# Patient Record
Sex: Male | Born: 1965 | Race: Black or African American | Hispanic: No | Marital: Married | Smoking: Never smoker
Health system: Southern US, Community
[De-identification: ages and names within clinical notes are randomized; demographics above are authoritative.]

## PROBLEM LIST (undated history)

## (undated) DIAGNOSIS — I10 Essential (primary) hypertension: Secondary | ICD-10-CM

## (undated) DIAGNOSIS — Z8739 Personal history of other diseases of the musculoskeletal system and connective tissue: Secondary | ICD-10-CM

## (undated) DIAGNOSIS — G4730 Sleep apnea, unspecified: Secondary | ICD-10-CM

## (undated) DIAGNOSIS — I639 Cerebral infarction, unspecified: Secondary | ICD-10-CM

## (undated) DIAGNOSIS — I219 Acute myocardial infarction, unspecified: Secondary | ICD-10-CM

## (undated) HISTORY — PX: HAND SURGERY: SHX662

## (undated) HISTORY — PX: CYST REMOVAL LEG: SHX6280

## (undated) HISTORY — PX: SKIN BIOPSY: SHX1

## (undated) HISTORY — PX: CARDIAC CATHETERIZATION: SHX172

## (undated) HISTORY — PX: ANKLE FRACTURE SURGERY: SHX122

---

## 2002-08-10 DIAGNOSIS — I219 Acute myocardial infarction, unspecified: Secondary | ICD-10-CM

## 2002-08-10 HISTORY — DX: Acute myocardial infarction, unspecified: I21.9

## 2011-08-11 HISTORY — PX: ROUX-EN-Y GASTRIC BYPASS: SHX1104

## 2013-05-28 ENCOUNTER — Emergency Department (HOSPITAL_COMMUNITY)
Admission: EM | Admit: 2013-05-28 | Discharge: 2013-05-28 | Disposition: A | Discharge status: Home / Self Care | Payer: 59 | Attending: Emergency Medicine | Admitting: Emergency Medicine

## 2013-05-28 ENCOUNTER — Encounter (HOSPITAL_COMMUNITY): Payer: Self-pay | Admitting: Emergency Medicine

## 2013-05-28 ENCOUNTER — Emergency Department (HOSPITAL_COMMUNITY): Payer: 59

## 2013-05-28 DIAGNOSIS — Z9861 Coronary angioplasty status: Secondary | ICD-10-CM | POA: Insufficient documentation

## 2013-05-28 DIAGNOSIS — I319 Disease of pericardium, unspecified: Secondary | ICD-10-CM | POA: Insufficient documentation

## 2013-05-28 DIAGNOSIS — Z79899 Other long term (current) drug therapy: Secondary | ICD-10-CM | POA: Insufficient documentation

## 2013-05-28 DIAGNOSIS — I10 Essential (primary) hypertension: Secondary | ICD-10-CM | POA: Insufficient documentation

## 2013-05-28 DIAGNOSIS — R079 Chest pain, unspecified: Secondary | ICD-10-CM | POA: Insufficient documentation

## 2013-05-28 HISTORY — DX: Essential (primary) hypertension: I10

## 2013-05-28 LAB — CBC
HCT: 40.8 % (ref 39.0–52.0)
MCH: 29 pg (ref 26.0–34.0)
MCHC: 34.6 g/dL (ref 30.0–36.0)
MCV: 83.8 fL (ref 78.0–100.0)
RDW: 13.5 % (ref 11.5–15.5)

## 2013-05-28 LAB — BASIC METABOLIC PANEL
BUN: 16 mg/dL (ref 6–23)
Calcium: 9.2 mg/dL (ref 8.4–10.5)
Creatinine, Ser: 1.25 mg/dL (ref 0.50–1.35)
GFR calc Af Amer: 78 mL/min — ABNORMAL LOW (ref >90)
GFR calc non Af Amer: 68 mL/min — ABNORMAL LOW (ref >90)

## 2013-05-28 LAB — POCT I-STAT TROPONIN I: Troponin i, poc: 0 ng/mL (ref 0.00–0.08)

## 2013-05-28 LAB — PRO B NATRIURETIC PEPTIDE: Pro B Natriuretic peptide (BNP): 186.5 pg/mL — ABNORMAL HIGH (ref 0–125)

## 2013-05-28 MED ORDER — ASPIRIN 81 MG PO CHEW
324.0000 mg | CHEWABLE_TABLET | Freq: Once | ORAL | Status: AC
  Administered 2013-05-28: 324 mg via ORAL
  Filled 2013-05-28: qty 4

## 2013-05-28 MED ORDER — IBUPROFEN 600 MG PO TABS: 600.0000 mg | ORAL_TABLET | Freq: Three times a day (TID) | ORAL | Status: DC | PRN

## 2013-05-28 MED ORDER — KETOROLAC TROMETHAMINE 30 MG/ML IJ SOLN
30.0000 mg | Freq: Once | INTRAMUSCULAR | Status: AC
  Administered 2013-05-28: 30 mg via INTRAVENOUS
  Filled 2013-05-28: qty 1

## 2013-05-28 MED ORDER — ASPIRIN 325 MG PO TABS: 325.0000 mg | ORAL_TABLET | ORAL | Status: DC

## 2013-05-28 MED ORDER — NITROGLYCERIN 0.4 MG SL SUBL
0.4000 mg | SUBLINGUAL_TABLET | SUBLINGUAL | Status: DC | PRN
  Administered 2013-05-28: 0.4 mg via SUBLINGUAL
  Filled 2013-05-28: qty 25

## 2013-05-28 MED ORDER — OXYCODONE-ACETAMINOPHEN 5-325 MG PO TABS: 1.0000 | ORAL_TABLET | ORAL | Status: DC | PRN

## 2013-05-28 MED ORDER — ONDANSETRON HCL 4 MG/2ML IJ SOLN
4.0000 mg | Freq: Once | INTRAMUSCULAR | Status: AC
Start: 2013-05-28 — End: 2013-05-28
  Administered 2013-05-28: 4 mg via INTRAVENOUS
  Filled 2013-05-28: qty 2

## 2013-05-28 MED ORDER — MORPHINE SULFATE 4 MG/ML IJ SOLN
6.0000 mg | Freq: Once | INTRAMUSCULAR | Status: AC
  Administered 2013-05-28: 6 mg via INTRAVENOUS
  Filled 2013-05-28: qty 2

## 2013-05-28 NOTE — ED Provider Notes (Signed)
CSN: [id number]     Arrival date & time 05/28/13  2022 History   First MD Initiated Contact with Patient 05/28/13 2027     Chief Complaint  Patient presents with  . Chest Pain    HPI Patient reports with constant chest pain in the anterior chest which began this morning and has been constant through the day although it has worsened this evening.  He does report that this pain feels sharp and does slightly make him feel short of breath.  No history of DVT or pulmonary embolism.  He reports he had a heart catheterization in 2012 that demonstrated nonobstructive coronary disease.  He states pain is worse when he lays flat it is improved when he sits up.  No cough. no congestion.  Symptoms are mild to moderate in severity   Past Medical History  Diagnosis Date  . Hypertension    History reviewed. No pertinent past surgical history. History reviewed. No pertinent family history. History  Substance Use Topics  . Smoking status: Never Smoker   . Smokeless tobacco: Not on file  . Alcohol Use: No    Review of Systems  All other systems reviewed and are negative.    Allergies  Review of patient's allergies indicates no known allergies.  Home Medications   Current Outpatient Rx  Name  Route  Sig  Dispense  Refill  . colchicine 0.6 MG tablet   Oral   Take 0.6 mg by mouth daily.         Marland Kitchen ibuprofen (ADVIL,MOTRIN) 600 MG tablet   Oral   Take 1 tablet (600 mg total) by mouth every 8 (eight) hours as needed for pain.   15 tablet   0   . oxyCODONE-acetaminophen (PERCOCET/ROXICET) 5-325 MG per tablet   Oral   Take 1 tablet by mouth every 4 (four) hours as needed for pain.   20 tablet   0    BP 147/90  Pulse 78  Temp(Src) 98.6 F (37 C) (Oral)  Resp 12  SpO2 96% Physical Exam  Nursing note and vitals reviewed. Constitutional: He is oriented to person, place, and time. He appears well-developed and well-nourished.  HENT:  Head: Normocephalic and atraumatic.  Eyes: EOM  are normal.  Neck: Normal range of motion.  Cardiovascular: Normal rate, regular rhythm, normal heart sounds and intact distal pulses.   Pulmonary/Chest: Effort normal and breath sounds normal. No respiratory distress.  Abdominal: Soft. He exhibits no distension. There is no tenderness.  Musculoskeletal: Normal range of motion.  Neurological: He is alert and oriented to person, place, and time.  Skin: Skin is warm and dry.  Psychiatric: He has a normal mood and affect. Judgment normal.    ED Course  Procedures (including critical care time) Labs Review Labs Reviewed  BASIC METABOLIC PANEL - Abnormal; Notable for the following:    Potassium 3.2 (*)    Glucose, Bld 104 (*)    GFR calc non Af Amer 68 (*)    GFR calc Af Amer 78 (*)    All other components within normal limits  PRO B NATRIURETIC PEPTIDE - Abnormal; Notable for the following:    Pro B Natriuretic peptide (BNP) 186.5 (*)    All other components within normal limits  CBC  POCT I-STAT TROPONIN I   Imaging Review Dg Chest Port 1 View  05/28/2013   CLINICAL DATA:  Chest pain  EXAM: PORTABLE CHEST - 1 VIEW  COMPARISON:  None.  FINDINGS: The heart size  and mediastinal contours are within normal limits. Both lungs are clear. The visualized skeletal structures are unremarkable.  IMPRESSION: No active disease.   Electronically Signed   By: Marlan Palau M.D.   On: 05/28/2013 20:58  I personally reviewed the imaging tests through PACS system I reviewed available ER/hospitalization records through the EMR   EKG Interpretation     Ventricular Rate:  91 PR Interval:  187 QRS Duration: 104 QT Interval:  377 QTC Calculation: 464 R Axis:   79 Text Interpretation:  Sinus rhythm No previous ECGs available            MDM   1. Chest pain   2. Pericarditis    As his symptoms seem more consistent with pericarditis.  This does not appear to be a presentation of ACS.  Her catheterization in 2012 demonstrating  nonobstructive disease per the patient.  He does not have stents in his heart.  Discharge home in good condition.    Lyanne Co, MD 05/28/13 [phone]

## 2013-05-28 NOTE — ED Notes (Signed)
Pt with onset of chest pain this morning, intermitten today, worse tonight, has had nausea, vomiting, sweating, weakness, dizziness. History of MI 2012

## 2014-05-27 DIAGNOSIS — I639 Cerebral infarction, unspecified: Secondary | ICD-10-CM

## 2014-05-27 HISTORY — DX: Cerebral infarction, unspecified: I63.9

## 2014-05-31 ENCOUNTER — Emergency Department (HOSPITAL_COMMUNITY): Payer: 59

## 2014-05-31 ENCOUNTER — Inpatient Hospital Stay (HOSPITAL_COMMUNITY): Payer: 59

## 2014-05-31 ENCOUNTER — Encounter (HOSPITAL_COMMUNITY): Payer: Self-pay | Admitting: Emergency Medicine

## 2014-05-31 ENCOUNTER — Inpatient Hospital Stay (HOSPITAL_COMMUNITY)
Admission: EM | Admit: 2014-05-31 | Discharge: 2014-06-01 | DRG: 092 | Disposition: A | Discharge status: Home / Self Care | Payer: 59 | Attending: Internal Medicine | Admitting: Internal Medicine

## 2014-05-31 DIAGNOSIS — E663 Overweight: Secondary | ICD-10-CM | POA: Diagnosis present

## 2014-05-31 DIAGNOSIS — R51 Headache: Secondary | ICD-10-CM | POA: Diagnosis present

## 2014-05-31 DIAGNOSIS — M940 Chondrocostal junction syndrome [Tietze]: Secondary | ICD-10-CM | POA: Diagnosis present

## 2014-05-31 DIAGNOSIS — G8190 Hemiplegia, unspecified affecting unspecified side: Secondary | ICD-10-CM

## 2014-05-31 DIAGNOSIS — I10 Essential (primary) hypertension: Secondary | ICD-10-CM | POA: Diagnosis present

## 2014-05-31 DIAGNOSIS — I252 Old myocardial infarction: Secondary | ICD-10-CM

## 2014-05-31 DIAGNOSIS — R531 Weakness: Secondary | ICD-10-CM | POA: Diagnosis present

## 2014-05-31 DIAGNOSIS — G8191 Hemiplegia, unspecified affecting right dominant side: Secondary | ICD-10-CM | POA: Insufficient documentation

## 2014-05-31 DIAGNOSIS — R200 Anesthesia of skin: Principal | ICD-10-CM | POA: Diagnosis present

## 2014-05-31 DIAGNOSIS — E162 Hypoglycemia, unspecified: Secondary | ICD-10-CM | POA: Diagnosis present

## 2014-05-31 DIAGNOSIS — I639 Cerebral infarction, unspecified: Secondary | ICD-10-CM

## 2014-05-31 DIAGNOSIS — I2510 Atherosclerotic heart disease of native coronary artery without angina pectoris: Secondary | ICD-10-CM | POA: Diagnosis present

## 2014-05-31 DIAGNOSIS — R0789 Other chest pain: Secondary | ICD-10-CM | POA: Diagnosis present

## 2014-05-31 DIAGNOSIS — R079 Chest pain, unspecified: Secondary | ICD-10-CM | POA: Diagnosis present

## 2014-05-31 DIAGNOSIS — R071 Chest pain on breathing: Secondary | ICD-10-CM

## 2014-05-31 DIAGNOSIS — G5621 Lesion of ulnar nerve, right upper limb: Secondary | ICD-10-CM

## 2014-05-31 DIAGNOSIS — Z6827 Body mass index (BMI) 27.0-27.9, adult: Secondary | ICD-10-CM | POA: Diagnosis not present

## 2014-05-31 DIAGNOSIS — Z9884 Bariatric surgery status: Secondary | ICD-10-CM

## 2014-05-31 DIAGNOSIS — G5620 Lesion of ulnar nerve, unspecified upper limb: Secondary | ICD-10-CM

## 2014-05-31 DIAGNOSIS — G458 Other transient cerebral ischemic attacks and related syndromes: Secondary | ICD-10-CM

## 2014-05-31 HISTORY — DX: Cerebral infarction, unspecified: I63.9

## 2014-05-31 HISTORY — DX: Personal history of other diseases of the musculoskeletal system and connective tissue: Z87.39

## 2014-05-31 HISTORY — DX: Sleep apnea, unspecified: G47.30

## 2014-05-31 HISTORY — DX: Acute myocardial infarction, unspecified: I21.9

## 2014-05-31 LAB — CBC
HCT: 41.2 % (ref 39.0–52.0)
Hemoglobin: 14 g/dL (ref 13.0–17.0)
MCH: 28.9 pg (ref 26.0–34.0)
MCHC: 34 g/dL (ref 30.0–36.0)
MCV: 84.9 fL (ref 78.0–100.0)
PLATELETS: 280 10*3/uL (ref 150–400)
RBC: 4.85 MIL/uL (ref 4.22–5.81)
RDW: 13.1 % (ref 11.5–15.5)
WBC: 10.3 10*3/uL (ref 4.0–10.5)

## 2014-05-31 LAB — TROPONIN I: Troponin I: <0.3 ng/mL (ref <0.30)

## 2014-05-31 LAB — GLUCOSE, CAPILLARY
Glucose-Capillary: 120 mg/dL — ABNORMAL HIGH (ref 70–99)
Glucose-Capillary: 78 mg/dL (ref 70–99)

## 2014-05-31 LAB — BASIC METABOLIC PANEL
Anion gap: 13 (ref 5–15)
BUN: 13 mg/dL (ref 6–23)
CHLORIDE: 104 meq/L (ref 96–112)
CO2: 28 mEq/L (ref 19–32)
Calcium: 9.1 mg/dL (ref 8.4–10.5)
Creatinine, Ser: 1.34 mg/dL (ref 0.50–1.35)
GFR calc Af Amer: 71 mL/min — ABNORMAL LOW (ref >90)
GFR, EST NON AFRICAN AMERICAN: 62 mL/min — AB (ref >90)
GLUCOSE: 39 mg/dL — AB (ref 70–99)
Potassium: 3.8 mEq/L (ref 3.7–5.3)
Sodium: 145 mEq/L (ref 137–147)

## 2014-05-31 LAB — HEMOGLOBIN A1C
Hgb A1c MFr Bld: 6.1 % — ABNORMAL HIGH (ref <5.7)
Mean Plasma Glucose: 128 mg/dL — ABNORMAL HIGH (ref <117)

## 2014-05-31 LAB — PRO B NATRIURETIC PEPTIDE: PRO B NATRI PEPTIDE: 64.9 pg/mL (ref 0–125)

## 2014-05-31 LAB — I-STAT TROPONIN, ED: TROPONIN I, POC: 0.01 ng/mL (ref 0.00–0.08)

## 2014-05-31 LAB — CBG MONITORING, ED: Glucose-Capillary: 96 mg/dL (ref 70–99)

## 2014-05-31 LAB — D-DIMER, QUANTITATIVE (NOT AT ARMC)

## 2014-05-31 MED ORDER — ONDANSETRON HCL 4 MG/2ML IJ SOLN
4.0000 mg | Freq: Once | INTRAMUSCULAR | Status: AC
  Administered 2014-05-31: 4 mg via INTRAVENOUS
  Filled 2014-05-31: qty 2

## 2014-05-31 MED ORDER — INSULIN ASPART 100 UNIT/ML ~~LOC~~ SOLN: 0.0000 [IU] | Freq: Three times a day (TID) | SUBCUTANEOUS | Status: DC

## 2014-05-31 MED ORDER — ASPIRIN 325 MG PO TABS
325.0000 mg | ORAL_TABLET | ORAL | Status: AC
  Administered 2014-05-31: 325 mg via ORAL
  Filled 2014-05-31: qty 1

## 2014-05-31 MED ORDER — ASPIRIN 325 MG PO TABS
325.0000 mg | ORAL_TABLET | Freq: Every day | ORAL | Status: DC
  Administered 2014-05-31 – 2014-06-01 (×2): 325 mg via ORAL
  Filled 2014-05-31 (×2): qty 1

## 2014-05-31 MED ORDER — SODIUM CHLORIDE 0.9 % IV SOLN
INTRAVENOUS | Status: AC
  Administered 2014-05-31: 09:00:00 via INTRAVENOUS

## 2014-05-31 MED ORDER — MORPHINE SULFATE 2 MG/ML IJ SOLN
2.0000 mg | INTRAMUSCULAR | Status: DC | PRN
  Administered 2014-05-31: 2 mg via INTRAVENOUS
  Filled 2014-05-31: qty 1

## 2014-05-31 MED ORDER — ACETAMINOPHEN 325 MG PO TABS
650.0000 mg | ORAL_TABLET | ORAL | Status: DC | PRN
  Administered 2014-06-01: 650 mg via ORAL
  Filled 2014-05-31: qty 2

## 2014-05-31 MED ORDER — STROKE: EARLY STAGES OF RECOVERY BOOK
Freq: Once | Status: AC
  Administered 2014-06-01: 14:00:00
  Filled 2014-05-31 (×2): qty 1

## 2014-05-31 NOTE — ED Provider Notes (Signed)
CSN: [id number]     Arrival date & time 05/31/14  0020 History   First MD Initiated Contact with Patient 05/31/14 0256     Chief Complaint  Patient presents with  . Chest Pain  . Headache  . Extremity Weakness     (Consider location/radiation/quality/duration/timing/severity/associated sxs/prior Treatment) Patient is a 48 y.o. male presenting with chest pain, headaches, and extremity weakness. The history is provided by the patient.  Chest Pain Associated symptoms: headache   Headache Extremity Weakness Associated symptoms include chest pain and headaches.  He has been having sharp midsternal chest pain for the last 3 days. Her pain is worse with deep breath and worse with movement. He rates pain at 8/10. There is mild associated nausea and he has had some dyspnea and diaphoresis. He has also been noticing weakness in his right arm and leg which have been worsening over the last 3 days. Today, he could not turn the key in his car and could not lift up a cup. He has had a mild bifrontal headache. There's been no fever chills or sweats. He denies any cough. He did have a recent long-distance car trip to [place]. Symptoms started when he was in [place] but [facility] is a wait until he returns. He has a history of having an MI about 10 years ago. Catheterization was reported to show no obstructive coronary disease. He didn't denies tobacco use. There's a history of hypertension but not diabetes or hyperlipidemia.  Past Medical History  Diagnosis Date  . Hypertension   . MI (myocardial infarction)     2004   Past Surgical History  Procedure Laterality Date  . Gastric bypass      2013  . Ankle fracture surgery     History reviewed. No pertinent family history. History  Substance Use Topics  . Smoking status: Never Smoker   . Smokeless tobacco: Never Used  . Alcohol Use: No    Review of Systems  Cardiovascular: Positive for chest pain.   Musculoskeletal: Positive for extremity weakness.  Neurological: Positive for headaches.  All other systems reviewed and are negative.     Allergies  Review of patient's allergies indicates no known allergies.  Home Medications   Prior to Admission medications   Not on File   BP 171/103  Pulse 65  Temp(Src) 98.3 F (36.8 C) (Oral)  Resp 16  Ht 5\' 10"  (1.778 m)  Wt 190 lb (86.183 kg)  BMI 27.26 kg/m2  SpO2 98% Physical Exam  Nursing note and vitals reviewed.  48 year old male, resting comfortably and in no acute distress. Vital signs are significant for hypertension. Oxygen saturation is 98%, which is normal. Head is normocephalic and atraumatic. PERRLA, EOMI. Oropharynx is clear. Fundi show no hemorrhage, exudate, or papilledema. Neck is nontender and supple without adenopathy or JVD. No carotid bruits. Back is nontender and there is no CVA tenderness. Lungs are clear without rales, wheezes, or rhonchi. Chest is nontender. Heart has regular rate and rhythm without murmur. Abdomen is soft, flat, nontender without masses or hepatosplenomegaly and peristalsis is normoactive. Extremities have no cyanosis or edema, full range of motion is present. Skin is warm and dry without rash. Neurologic: Mental status is normal, cranial nerves are intact. There is some moderate right arm weakness with strength 3/5 and mild right leg weakness with strength 4/5. There is no pronator drift. There is no Babinski reflex.   ED Course  Procedures (including critical care  time) Labs Review Results for orders placed during the hospital encounter of 05/31/14  CBC      Result Value Ref Range   WBC 10.3  4.0 - 10.5 K/uL   RBC 4.85  4.22 - 5.81 MIL/uL   Hemoglobin 14.0  13.0 - 17.0 g/dL   HCT 16.141.2  09.639.0 - 04.552.0 %   MCV 84.9  78.0 - 100.0 fL   MCH 28.9  26.0 - 34.0 pg   MCHC 34.0  30.0 - 36.0 g/dL   RDW 40.913.1  81.111.5 - 91.415.5 %   Platelets 280  150 - 400 K/uL  BASIC METABOLIC PANEL      Result  Value Ref Range   Sodium 145  137 - 147 mEq/L   Potassium 3.8  3.7 - 5.3 mEq/L   Chloride 104  96 - 112 mEq/L   CO2 28  19 - 32 mEq/L   Glucose, Bld 39 (*) 70 - 99 mg/dL   BUN 13  6 - 23 mg/dL   Creatinine, Ser 7.821.34  0.50 - 1.35 mg/dL   Calcium 9.1  8.4 - 95.610.5 mg/dL   GFR calc non Af Amer 62 (*) >90 mL/min   GFR calc Af Amer 71 (*) >90 mL/min   Anion gap 13  5 - 15  PRO B NATRIURETIC PEPTIDE      Result Value Ref Range   Pro B Natriuretic peptide (BNP) 64.9  0 - 125 pg/mL  D-DIMER, QUANTITATIVE      Result Value Ref Range   D-Dimer, Quant <0.27  0.00 - 0.48 ug/mL-FEU  I-STAT TROPOININ, ED      Result Value Ref Range   Troponin i, poc 0.01  0.00 - 0.08 ng/mL   Comment 3           CBG MONITORING, ED      Result Value Ref Range   Glucose-Capillary 96  70 - 99 mg/dL   Comment 1 Notify RN     Imaging Review Dg Chest Port 1 View  05/31/2014   CLINICAL DATA:  48 year old male with central chest pain and shortness of Breath. Initial encounter.  EXAM: PORTABLE CHEST - 1 VIEW  COMPARISON:  05/28/2013.  FINDINGS: Portable AP semi upright view at 0056 hrs. Mildly lower lung volumes. Mild vascular congestion without overt edema. No pneumothorax, pleural effusion or confluent pulmonary opacity. Stable cardiac size and mediastinal contours. Visualized tracheal air column is within normal limits.  IMPRESSION: No acute cardiopulmonary abnormality.   Electronically Signed   By: Augusto GambleLee  Hall M.D.   On: 05/31/2014 01:06     EKG Interpretation   Date/Time:  Thursday May 31 2014 00:33:26 EDT Ventricular Rate:  83 PR Interval:  192 QRS Duration: 96 QT Interval:  392 QTC Calculation: 461 R Axis:   86 Text Interpretation:  Sinus rhythm Consider left atrial enlargement  Baseline wander in lead(s) V2 When compared with ECG of 05/28/2013, No  significant change was found Confirmed by [facility]  MD, Ande Therrell ([id number]) on  05/31/2014 1:02:46 AM      MDM   Final diagnoses:  Right hemiplegia  Chest  pain on breathing  Other specified transient cerebral ischemias    Pleuritic chest pain in setting of recent long-distance car trip worrisome for possible pulmonary embolism. ECG and troponin are unremarkable. D-dimer will be sent. Right arm and leg weakness worrisome for stroke. Since symptoms have been present for more than 3 days who is well outside of a code stroke window. He'll  be sent for CT of head. He will need to be admitted for further workup. Old records are reviewed and he has an ED visit one year ago for chest pain which was felt to be due to pericarditis.  Initial workup was unremarkable including negative CT scan. Case is discussed with Dr. Adela Glimpse of [facility] who agrees to admit the patient. Because of suspected stroke, he'll be transferred to [facility].  Dione Booze, MD 05/31/14 [phone]

## 2014-05-31 NOTE — ED Notes (Signed)
Dr. Preston FleetingGlick made aware of CBG readings.

## 2014-05-31 NOTE — Progress Notes (Signed)
  Echocardiogram 2D Echocardiogram has been performed.  Brian Velez FRANCES 05/31/2014, 11:29 AM

## 2014-05-31 NOTE — ED Notes (Signed)
LElton,RN notified of BG 39

## 2014-05-31 NOTE — Consult Note (Signed)
Referring Physician: Dr. Roel Cluck    Chief Complaint: right hemiparesis and HA  HPI:                                                                                                                                         Brian Velez is an 48 y.o. male with a past medical history that is relevant for HTN and MI, transferred to [facility] for further evaluation of the above stated symptoms. Patient said that he has been having chest pain and HA since this past $RemoveB'Sunday, and yesterday afternoon around 2 or 3 pm developed sudden onset of weakness of the right arm and subsequently the right leg. He said that he couldn\'t lift a coup of coffee or use the right hand as before. Further, the right arm feels " numb, dead\' and is having tingling in the right jaw. Stated that the HA was very intense and he vomited last night, but improved after receiving morphine at [facility] and now is having a slight HA. Denies vertigo, double vision, difficulty swallowing, slurred speech, face droopiness, confusion, language or vision impairment.  CT head showed no acute abnormality.  Date last known well: 05/30/14 Time last known well: 2 pm tPA Given: no, out of the window   Past Medical History  Diagnosis Date  . Hypertension   . MI (myocardial infarction)     2004    Past Surgical History  Procedure Laterality Date  . Gastric bypass      20'XzOjlAPC$ 13  . Ankle fracture surgery      Family History  Problem Relation Age of Onset  . Dementia Mother   . Breast cancer Mother   . CAD Father   . Stroke Father   . Prostate cancer Father   . Diabetes type II Sister   . Diabetes type II Brother   . Diabetes type II Sister   . Diabetes type II Sister    Social History:  reports that he has never smoked. He has never used smokeless tobacco. He reports that he does not drink alcohol or use illicit drugs.  Allergies: No Known Allergies  Medications:                                                                             I  have reviewed outside medications.  History obtained from the patient and chart review  ROS:  General ROS: negative for - chills, fatigue, fever, night sweats, weight gain or weight loss Psychological ROS: negative for - behavioral disorder, hallucinations, memory difficulties, mood swings or suicidal ideation Ophthalmic ROS: negative for - blurry vision, double vision, eye pain or loss of vision ENT ROS: negative for - epistaxis, nasal discharge, oral lesions, sore throat, tinnitus or vertigo Allergy and Immunology ROS: negative for - hives or itchy/watery eyes Hematological and Lymphatic ROS: negative for - bleeding problems, bruising or swollen lymph nodes Endocrine ROS: negative for - galactorrhea, hair pattern changes, polydipsia/polyuria or temperature intolerance Respiratory ROS: negative for - cough, hemoptysis, shortness of breath or wheezing Cardiovascular ROS: negative for - dyspnea on exertion, edema or irregular heartbeat Gastrointestinal ROS: negative for - abdominal pain, diarrhea, hematemesis, nausea/vomiting or stool incontinence Genito-Urinary ROS: negative for - dysuria, hematuria, incontinence or urinary frequency/urgency Musculoskeletal ROS: negative for - joint swelling Neurological ROS: as noted in HPI Dermatological ROS: negative for rash and skin lesion changes  Physical exam: pleasant male in no apparent distress. Blood pressure 158/90, pulse 60, temperature 98.6 F (37 C), temperature source Oral, resp. rate 22, height $RemoveBe'5\' 10"'hbmfjZFpE$  (1.778 m), weight 86.183 kg (190 lb), SpO2 100.00%. Head: normocephalic. Neck: supple, no bruits, no JVD. Cardiac: no murmurs. Lungs: clear. Abdomen: soft, no tender, no mass. Extremities: no edema. Neurologic Examination:                                                                                                      General: Mental  Status: Alert, oriented, thought content appropriate.  Speech fluent without evidence of aphasia.  Able to follow 3 step commands without difficulty. Cranial Nerves: II: Discs flat bilaterally; Visual fields grossly normal, pupils equal, round, reactive to light and accommodation III,IV, VI: ptosis not present, extra-ocular motions intact bilaterally V,VII: smile symmetric, facial light touch sensation normal bilaterally VIII: hearing normal bilaterally IX,X: gag reflex present XI: bilateral shoulder shrug XII: midline tongue extension without atrophy or fasciculations Motor: Right : Upper extremity   5/5    Left:     Upper extremity   5/5  Lower extremity  4+/5     Lower extremity   5/5 Tone and bulk:normal tone throughout; no atrophy noted Sensory: Pinprick and light touch intact throughout, bilaterally Deep Tendon Reflexes:  Right: Upper Extremity   Left: Upper extremity   biceps (C-5 to C-6) 2/4   biceps (C-5 to C-6) 2/4 tricep (C7) 2/4    triceps (C7) 2/4 Brachioradialis (C6) 2/4  Brachioradialis (C6) 2/4  Lower Extremity Lower Extremity  quadriceps (L-2 to L-4) 2/4   quadriceps (L-2 to L-4) 2/4 Achilles (S1) 2/4   Achilles (S1) 2/4 Plantars: Right: downgoing   Left: downgoing Cerebellar: normal finger-to-nose,  normal heel-to-shin test Gait:  No tested due to safety reasons    Results for orders placed during the hospital encounter of 05/31/14 (from the past 48 hour(s))  CBC     Status: None   Collection Time    05/31/14 12:44 AM      Result Value Ref Range   WBC  10.3  4.0 - 10.5 K/uL   RBC 4.85  4.22 - 5.81 MIL/uL   Hemoglobin 14.0  13.0 - 17.0 g/dL   HCT 41.2  39.0 - 52.0 %   MCV 84.9  78.0 - 100.0 fL   MCH 28.9  26.0 - 34.0 pg   MCHC 34.0  30.0 - 36.0 g/dL   RDW 13.1  11.5 - 15.5 %   Platelets 280  150 - 400 K/uL  BASIC METABOLIC PANEL     Status: Abnormal   Collection Time    05/31/14 12:44 AM      Result Value Ref Range   Sodium 145  137 - 147 mEq/L    Potassium 3.8  3.7 - 5.3 mEq/L   Chloride 104  96 - 112 mEq/L   CO2 28  19 - 32 mEq/L   Glucose, Bld 39 (*) 70 - 99 mg/dL   Comment: REPEATED TO VERIFY     CRITICAL RESULT CALLED TO, READ BACK BY AND VERIFIED WITH:     A.DENNIS AT 0017 ON 10.22.15 BY SHEA.W   BUN 13  6 - 23 mg/dL   Creatinine, Ser 1.34  0.50 - 1.35 mg/dL   Calcium 9.1  8.4 - 10.5 mg/dL   GFR calc non Af Amer 62 (*) >90 mL/min   GFR calc Af Amer 71 (*) >90 mL/min   Comment: (NOTE)     The eGFR has been calculated using the CKD EPI equation.     This calculation has not been validated in all clinical situations.     eGFR's persistently <90 mL/min signify possible Chronic Kidney     Disease.   Anion gap 13  5 - 15  PRO B NATRIURETIC PEPTIDE     Status: None   Collection Time    05/31/14 12:45 AM      Result Value Ref Range   Pro B Natriuretic peptide (BNP) 64.9  0 - 125 pg/mL  I-STAT TROPOININ, ED     Status: None   Collection Time    05/31/14 12:56 AM      Result Value Ref Range   Troponin i, poc 0.01  0.00 - 0.08 ng/mL   Comment 3            Comment: Due to the release kinetics of cTnI,     a negative result within the first hours     of the onset of symptoms does not rule out     myocardial infarction with certainty.     If myocardial infarction is still suspected,     repeat the test at appropriate intervals.  CBG MONITORING, ED     Status: None   Collection Time    05/31/14  2:50 AM      Result Value Ref Range   Glucose-Capillary 96  70 - 99 mg/dL   Comment 1 Notify RN    D-DIMER, QUANTITATIVE     Status: None   Collection Time    05/31/14  3:07 AM      Result Value Ref Range   D-Dimer, Quant <0.27  0.00 - 0.48 ug/mL-FEU   Comment:            AT THE INHOUSE ESTABLISHED CUTOFF     VALUE OF 0.48 ug/mL FEU,     THIS ASSAY HAS BEEN DOCUMENTED     IN THE LITERATURE TO HAVE     A SENSITIVITY AND NEGATIVE     PREDICTIVE VALUE OF AT LEAST  98 TO 99%.  THE TEST RESULT     SHOULD BE CORRELATED WITH      AN ASSESSMENT OF THE CLINICAL     PROBABILITY OF DVT / VTE.   Ct Head Wo Contrast  05/31/2014   CLINICAL DATA:  Frontal headache for 3 days, chest pain radiating to RIGHT arm with heaviness. Hypoglycemia.  EXAM: CT HEAD WITHOUT CONTRAST  TECHNIQUE: Contiguous axial images were obtained from the base of the skull through the vertex without intravenous contrast.  COMPARISON:  None.  FINDINGS: The ventricles and sulci are normal. No intraparenchymal hemorrhage, mass effect nor midline shift. No acute large vascular territory infarcts.  No abnormal extra-axial fluid collections. Basal cisterns are patent.  No skull fracture. The included ocular globes and orbital contents are non-suspicious. The mastoid aircells and included paranasal sinuses are well-aerated. Dermal calcifications.  IMPRESSION: No acute intracranial process.   Electronically Signed   By: Elon Alas   On: 05/31/2014 03:47   Dg Chest Port 1 View  05/31/2014   CLINICAL DATA:  48 year old male with central chest pain and shortness of Breath. Initial encounter.  EXAM: PORTABLE CHEST - 1 VIEW  COMPARISON:  05/28/2013.  FINDINGS: Portable AP semi upright view at 0056 hrs. Mildly lower lung volumes. Mild vascular congestion without overt edema. No pneumothorax, pleural effusion or confluent pulmonary opacity. Stable cardiac size and mediastinal contours. Visualized tracheal air column is within normal limits.  IMPRESSION: No acute cardiopulmonary abnormality.   Electronically Signed   By: Lars Pinks M.D.   On: 05/31/2014 01:06    Assessment: 48 y.o. male presents with chest pain, HA, and right sided weakness-numbness since yesterday. CT brain negative for acute abnormality. Neuro-exam significant for subtle left leg drift. Could have a left subcortical lacune, but his exam is not particularly impressive. He is out of the window for thrombolysis. Recommend: MRI/MRA brain and further stroke work up if neuroimaging confirms  stroke. Aspirin. Will follow up.  Dorian Pod, MD [facility] [phone]  05/31/2014, 8:30 AM

## 2014-05-31 NOTE — ED Notes (Signed)
[facility] staff present [facility]

## 2014-05-31 NOTE — ED Notes (Signed)
Patient transported to CT 

## 2014-05-31 NOTE — ED Notes (Signed)
[facility] called to transport pt to [facility] 

## 2014-05-31 NOTE — Progress Notes (Signed)
Occupational Therapy Evaluation Patient Details Name: Brian Velez MRN: [id number] DOB: 06/10/1966 Today's Date: 05/31/2014    History of Present Illness 48 y.o. male presents with chest pain, HA, and right sided weakness-numbness . CT -. CVA work up underway.[place].   Clinical Impression   PTA, pt lived with teenage children, was independent with ADL and mobility and works as a Education officer, environmentalastor. Pt presents with R sides weakness (arm greater than leg), sensory deficits with RUE and part of R face and below listed deficits. At this time, recommend CIR consult. Pt was independent and very active PTA and lives in apt with 2 flights to entry. Feel pt can return to mod I level with short CIR stay to safely D/C home. If pt begins to make progress and can safely navigate steps, he may be able to D/C home with HHOT. Will follow acutely to maximize functional level of independence and facilitate safe d/c plan.     Follow Up Recommendations  Supervision/Assistance - 24 hour;CIR    Equipment Recommendations  3 in 1 bedside comode    Recommendations for Other Services Rehab consult     Precautions / Restrictions Precautions Precautions: Fall      Mobility Bed Mobility Overal bed mobility: Modified Independent                Transfers Overall transfer level: Needs assistance Equipment used: 1 person hand held assist Transfers: Sit to/from Stand;Stand Pivot Transfers Sit to Stand: Min assist Stand pivot transfers: Min assist       General transfer comment: Pt with bathroom priviledges, therefore limited mobility    Balance Overall balance assessment: Needs assistance   Sitting balance-Leahy Scale: Good     Standing balance support: During functional activity;Single extremity supported Standing balance-Leahy Scale: Fair Standing balance comment: dynamic balance is poor - pt reaching for suport                            ADL Overall ADL's : Needs  assistance/impaired     Grooming: Set up;Sitting   Upper Body Bathing: Set up;Sitting   Lower Body Bathing: Sit to/from stand;Minimal assistance   Upper Body Dressing : Set up;Sitting   Lower Body Dressing: Minimal assistance;Sit to/from stand   Toilet Transfer: Minimal assistance;Ambulation   Toileting- Clothing Manipulation and Hygiene: Sit to/from stand;Minimal assistance       Functional mobility during ADLs: Minimal assistance General ADL Comments: limited due to balance deficits and RUE wekaness     Vision              will further assess   Additional Comments: will further assess. Pt states blurred vision has resolved   Perception Perception Perception Tested?:  (appears WFL. will further assess)   Praxis Praxis Praxis tested?: Within functional limits    Pertinent Vitals/Pain Pain Assessment: 0-10 Pain Score: 4  Pain Location: r shoulder Pain Descriptors / Indicators: Discomfort Pain Intervention(s): Limited activity within patient's tolerance;Monitored during session;Repositioned     Hand Dominance Right   Extremity/Trunk Assessment Upper Extremity Assessment Upper Extremity Assessment: RUE deficits/detail RUE Deficits / Details: RUE generalized weakness. AROM WFL with the exception of wrist extension - able to hold in extension, but unable to actively extend. decreased in-hand manipulation skills Pt limited with shoulder movemetn due to weakness andc/o jont pain, which is new. Pt had fall at [facility], but unsure if shoulder injured during fall.  RUE: Unable to fully assess due  to pain RUE Sensation: decreased light touch (Pt reports arm feels "like it's asleep") RUE Coordination: decreased fine motor;decreased gross motor   Lower Extremity Assessment Lower Extremity Assessment: RLE deficits/detail RLE Deficits / Details: generalized weakness. decreased speen heel/shin. Pt to further evaluation   Cervical / Trunk Assessment Cervical / Trunk  Assessment: Normal   Communication Communication Communication: No difficulties   Cognition Arousal/Alertness: Awake/alert Behavior During Therapy: WFL for tasks assessed/performed Overall Cognitive Status: Within Functional Limits for tasks assessed                     General Comments       Exercises       Shoulder Instructions      Home Living Family/patient expects to be discharged to:: Private residence Living Arrangements: Children Available Help at Discharge: Available PRN/intermittently (working tosee if he can get 24/7 S from church family) Type of Home: Apartment Home Access: Stairs to enter Secretary/administratorntrance Stairs-Number of Steps: 2 flights   Home Layout: One level     Bathroom Shower/Tub: Chief Strategy OfficerTub/shower unit   Bathroom Toilet: Standard Bathroom Accessibility: Yes How Accessible: Accessible via walker Home Equipment: None          Prior Functioning/Environment Level of Independence: Independent        Comments: PAstor of church    OT Diagnosis: Generalized weakness;Acute pain (hemiparesis)   OT Problem List: Decreased strength;Decreased range of motion;Decreased activity tolerance;Impaired balance (sitting and/or standing);Decreased coordination;Decreased knowledge of use of DME or AE;Impaired sensation;Impaired UE functional use;Pain   OT Treatment/Interventions: Self-care/ADL training;Therapeutic exercise;Neuromuscular education;DME and/or AE instruction;Therapeutic activities;Patient/family education;Balance training    OT Goals(Current goals can be found in the care plan section) Acute Rehab OT Goals Patient Stated Goal: to get the use of my arm back OT Goal Formulation: With patient Time For Goal Achievement: 06/14/14 Potential to Achieve Goals: Good  OT Frequency: Min 2X/week   Barriers to D/C:    2 flights of stairs to apt       Co-evaluation              End of Session Nurse Communication: Mobility status  Activity Tolerance:  Patient tolerated treatment well Patient left: in bed;with call bell/phone within reach;with bed alarm set;with family/visitor present   Time: 1335-1355 OT Time Calculation (min): 20 min Charges:  OT General Charges $OT Visit: 1 Procedure OT Evaluation $Initial OT Evaluation Tier I: 1 Procedure OT Treatments $Self Care/Home Management : 8-22 mins G-Codes:    Jeanie Mccard,HILLARY 05/31/2014, 2:30 PM   [facility] Maxen Rowland, OTR/L  [phone] 05/31/2014

## 2014-05-31 NOTE — ED Notes (Signed)
Pt states started having centralized chest pain Sunday, states has been constant sharp pain, pt states has also had dizziness, nausea (no vomiting or diarrhea), and shortness of breath, pain/numbness in R arm and leg

## 2014-05-31 NOTE — ED Notes (Signed)
EKG given to EDP,Glick,MD., for review. 

## 2014-05-31 NOTE — Progress Notes (Addendum)
Patient admitted after midnight by Dr. Adela Glimpseoutova- please see H&P.  Await echo,carotids, MRI.  Passed swallow- will allow to eat.  Cycle CE, tele Patient was hypoglycemic upon last lab checks Brian Velez

## 2014-05-31 NOTE — H&P (Addendum)
PCP:  none   Chief Complaint:  Right side weakness and chest pain  HPI: Brian Velez is a 48 y.o. male   has a past medical history of Hypertension and MI (myocardial infarction).   Presented with  3-4 days of chest pain that is sharp worse with deep inspiration. Patient did have some recent travel and reports his left leg was swollen on Monday but it was mild and he thought it was related to prior history of surgery in that leg. Yesterday 10/21 patient started to have headache and right side weakness. He reports no trouble with walking, no slurred speech. Patient was out of town and did not want to seek medical attention. He presented to ER when he arrive home to [place]. CT head was unremarkable.   D.dimer was negative. Given right side weakness Neurology consult was called and recommended transfer to [facility].  Patient states that he has had in the past history of coronary artery disease.  Although this wasn't his early age.   Hospitalist was called for admission for  right hemiplegia possible TIA vs CVA  Review of Systems:    Pertinent positives include:  chills, chest pain,  nausea,  shortness of breath at rest.  Constitutional:  No weight loss, night sweats, Fevers, fatigue, weight loss  HEENT:  No headaches, Difficulty swallowing,Tooth/dental problems,Sore throat,  No sneezing, itching, ear ache, nasal congestion, post nasal drip,  Cardio-vascular:  No Orthopnea, PND, anasarca, dizziness, palpitations.no Bilateral lower extremity swelling  GI:  No heartburn, indigestion, abdominal pain,vomiting, diarrhea, change in bowel habits, loss of appetite, melena, blood in stool, hematemesis Resp:  no No dyspnea on exertion, No excess mucus, no productive cough, No non-productive cough, No coughing up of blood.No change in color of mucus. No wheezing. Skin:  no rash or lesions. No jaundice GU:  no dysuria, change in color of urine, no urgency or frequency. No straining to  urinate.  No flank pain.  Musculoskeletal:  No joint pain or no joint swelling. No decreased range of motion. No back pain.  Psych:  No change in mood or affect. No depression or anxiety. No memory loss.  Neuro: no localizing neurological complaints, no tingling, no weakness, no double vision, no gait abnormality, no slurred speech, no confusion  Otherwise ROS are negative except for above, 10 systems were reviewed  Past Medical History: Past Medical History  Diagnosis Date  . Hypertension   . MI (myocardial infarction)     2004   Past Surgical History  Procedure Laterality Date  . Gastric bypass      2013  . Ankle fracture surgery       Medications: Prior to Admission medications   Not on File    Allergies:  No Known Allergies  Social History:  Ambulatory  Independently  Lives at home  With family     reports that he has never smoked. He has never used smokeless tobacco. He reports that he does not drink alcohol or use illicit drugs.    Family History: family history includes Breast cancer in his mother; CAD in his father; Dementia in his mother; Diabetes type II in his brother, sister, sister, and sister; Prostate cancer in his father; Stroke in his father.    Physical Exam: Patient Vitals for the past 24 hrs:  BP Temp Temp src Pulse Resp SpO2 Height Weight  05/31/14 0330 155/91 mmHg 98.6 F (37 C) Oral 72 16 99 % - -  05/31/14 0325 - 98.6  F (37 C) - - - - - -  05/31/14 0215 - - - 65 16 98 % - -  05/31/14 0200 - - - 75 23 99 % - -  05/31/14 0115 - - - 65 19 99 % - -  05/31/14 0039 - - - - - - 5\' 10"  (1.778 m) 86.183 kg (190 lb)  05/31/14 0029 171/103 mmHg 98.3 F (36.8 C) Oral 86 18 100 % - -    1. General:  in No Acute distress 2. Psychological: Alert and   Oriented 3. Head/ENT:   Moist  Mucous Membranes                          Head Non traumatic, neck supple                          Normal   Dentition 4. SKIN: normal   Skin turgor,  Skin clean  Dry and intact no rash 5. Heart: Regular rate and rhythm no Murmur, Rub or gallop 6. Lungs: Clear to auscultation bilaterally, no wheezes or crackles   7. Abdomen: Soft, non-tender, Non distended 8. Lower extremities: no clubbing, cyanosis, or edema 9. Neurologically diminished strength on the right cranial nerves II through XII intact 10. MSK: Normal range of motion  body mass index is 27.26 kg/(m^2).   Labs on Admission:   Recent Labs  05/31/14 0044  NA 145  K 3.8  CL 104  CO2 28  GLUCOSE 39*  BUN 13  CREATININE 1.34  CALCIUM 9.1   No results found for this basename: AST, ALT, ALKPHOS, BILITOT, PROT, ALBUMIN,  in the last 72 hours No results found for this basename: LIPASE, AMYLASE,  in the last 72 hours  Recent Labs  05/31/14 0044  WBC 10.3  HGB 14.0  HCT 41.2  MCV 84.9  PLT 280   No results found for this basename: CKTOTAL, CKMB, CKMBINDEX, TROPONINI,  in the last 72 hours No results found for this basename: TSH, T4TOTAL, FREET3, T3FREE, THYROIDAB,  in the last 72 hours No results found for this basename: VITAMINB12, FOLATE, FERRITIN, TIBC, IRON, RETICCTPCT,  in the last 72 hours No results found for this basename: HGBA1C    Estimated Creatinine Clearance: 70.4 ml/min (by C-G formula based on Cr of 1.34). ABG No results found for this basename: phart, pco2, po2, hco3, tco2, acidbasedef, o2sat     Lab Results  Component Value Date   DDIMER <0.27 05/31/2014     Other results:  I have pearsonaly reviewed this: ECG REPORT  Rate: 83  Rhythm: SR ST&T Change: no ischemic changes   BNP (last 3 results)  Recent Labs  05/31/14 0045  PROBNP 64.9    Filed Weights   05/31/14 0039  Weight: 86.183 kg (190 lb)     Cultures: No results found for this basename: sdes, specrequest, cult, reptstatus     Radiological Exams on Admission: Ct Head Wo Contrast  05/31/2014   CLINICAL DATA:  Frontal headache for 3 days, chest pain radiating to RIGHT arm with  heaviness. Hypoglycemia.  EXAM: CT HEAD WITHOUT CONTRAST  TECHNIQUE: Contiguous axial images were obtained from the base of the skull through the vertex without intravenous contrast.  COMPARISON:  None.  FINDINGS: The ventricles and sulci are normal. No intraparenchymal hemorrhage, mass effect nor midline shift. No acute large vascular territory infarcts.  No abnormal extra-axial fluid collections. Basal cisterns  are patent.  No skull fracture. The included ocular globes and orbital contents are non-suspicious. The mastoid aircells and included paranasal sinuses are well-aerated. Dermal calcifications.  IMPRESSION: No acute intracranial process.   Electronically Signed   By: Awilda Metroourtnay  Bloomer   On: 05/31/2014 03:47   Dg Chest Port 1 View  05/31/2014   CLINICAL DATA:  48 year old male with central chest pain and shortness of Breath. Initial encounter.  EXAM: PORTABLE CHEST - 1 VIEW  COMPARISON:  05/28/2013.  FINDINGS: Portable AP semi upright view at 0056 hrs. Mildly lower lung volumes. Mild vascular congestion without overt edema. No pneumothorax, pleural effusion or confluent pulmonary opacity. Stable cardiac size and mediastinal contours. Visualized tracheal air column is within normal limits.  IMPRESSION: No acute cardiopulmonary abnormality.   Electronically Signed   By: Augusto GambleLee  Hall M.D.   On: 05/31/2014 01:06    Chart has been reviewed  Assessment/Plan  48 year old male with history of coronary artery disease reportedly, presents with pleuritic chest pain with normal d-dimer and right-sided weakness with unremarkable a CT scan of the head.  Present on Admission:  . Chest pain - etiology unclear atypical for coronary artery disease. d-dimer negative which is reassuring. Patient had had similar presentation in the past and was diagnosed with costochondritis. Will monitor on telemetry cycle cardiac enzymes obtain serial EKG Right hemiplegia. We'll evaluate for TIA versus CVA. Admit to telemetry  [facility], neurology consult MRI/MRA in the morning, will obtain lipid panel, TSH, hemoglobin A1c, PT/ OT evaluation, antiplatelet agent Prophylaxis: SCD, Protonix  CODE STATUS: FULL CODE  Other plan as per orders.  I have spent a total of 55 min on this admission  Quyen Cutsforth 05/31/2014, 5:15 AM  [facility]  Pager [phone]   after 2 AM please page floor coverage PA If 7AM-7PM, please contact the day team taking care of the patient  [email or web address]  Password [email or web address]

## 2014-05-31 NOTE — ED Notes (Signed)
Attempted to give report to 3W, nurse stated since pt is having chest pain of 6/10 they cannot take pt, Dr. Adela Glimpseoutova made aware, was told pt does not step down and new order to give morphine for pain, will give morphine and reevaluate pain.

## 2014-05-31 NOTE — ED Notes (Signed)
Pt states that he was awoken from sleep Sunday morning with central chest pain that radiates down his rt arm. Pt describes the pain as "stabbing" and states that it gets worse with deep inhalation. Pt has hx of MI 11 years ago. Pt had had nausea (denies vomiting/diarrhea), lightheadedness, and SOB. Pt states that he is now experiencing weakness in his right arm and leg and has an increasing HA.

## 2014-05-31 NOTE — ED Notes (Signed)
Notified RN,Lauren pt. CBG 96.

## 2014-05-31 NOTE — Evaluation (Signed)
Physical Therapy Evaluation Patient Details Name: Brian Velez MRN: [id number] DOB: 06/02/1966 Today's Date: 05/31/2014   History of Present Illness  48 y.o. male presents with chest pain, HA, and right sided weakness-numbness . CT -. CVA work up underway.[place].   Clinical Impression  Patient presents with right sided weakness (RUE worse than RLE) affecting balance and safe mobility. Pt tolerated negotiating steps with Min guard for safety however very unsteady. Pt independent and high functioning PTA and not safe to ambulate without hands on assist during evaluation due to balance concerns and almost knee buckling RLE. Pt at risk for falls. [facility] to bathroom unassisted. Would benefit from short stay at CIR prior to d/c home to improve safety, balance and functional mobility so pt can maximize independence and return to PLOF. If pt continues to progress and improve, would benefit from OPPT.    Follow Up Recommendations CIR;Supervision for mobility/OOB    Equipment Recommendations  None recommended by PT    Recommendations for Other Services       Precautions / Restrictions Precautions Precautions: Fall Restrictions Weight Bearing Restrictions: No      Mobility  Bed Mobility Overal bed mobility: Modified Independent;Needs Assistance Bed Mobility: Supine to Sit     Supine to sit: Modified independent (Device/Increase time);HOB elevated        Transfers Overall transfer level: Needs assistance Equipment used: None Transfers: Sit to/from Stand Sit to Stand: Min guard Stand pivot transfers: Min guard       General transfer comment: Min guard for safety to stand. SPT into w/c post ambulation to go down for MRI.  Ambulation/Gait Ambulation/Gait assistance: Min assist Ambulation Distance (Feet): 150 Feet Assistive device: None Gait Pattern/deviations: Step-through pattern;Decreased stride length;Decreased stance time -  right;Decreased step length - left;Drifts right/left Gait velocity: decreased   General Gait Details: Unsteady gait pattern with increased knee flexion RLE throughout gait cycle. Mild drifting noted with unsteadiness especially during turns. Almost knee buckling on a few occasions esp when fatigued. Dyspnea present.  Stairs Stairs: Yes Stairs assistance: Min guard Stair Management: One rail Right;One rail Left;Step to pattern Number of Stairs: 11 General stair comments: Started with alternating step pattern to ascend steps however educated pt to utilize step to gait pattern for safety as pt with weakness through RLE. Min guard for safety due to unsteadiness but no LOB.  Wheelchair Mobility    Modified Rankin (Stroke Patients Only)       Balance Overall balance assessment: Needs assistance Sitting-balance support: Single extremity supported Sitting balance-Leahy Scale: Good Sitting balance - Comments: Able to doff and donn socks sitting EOB, reaching outside BoS without difficulty or LOB.   Standing balance support: During functional activity Standing balance-Leahy Scale: Fair Standing balance comment: Pt reaching for handrail at times for support during dynamic activities, tolerated short distance ambulation without support however cannot tolerate challenge.                             Pertinent Vitals/Pain Pain Assessment: No/denies pain Pain Score: 4  Pain Location: r shoulder Pain Descriptors / Indicators: Discomfort Pain Intervention(s): Limited activity within patient's tolerance;Monitored during session;Repositioned    Home Living Family/patient expects to be discharged to:: Private residence Living Arrangements: Children Available Help at Discharge: Available PRN/intermittently Type of Home: Apartment Home Access: Stairs to enter   Entergy CorporationEntrance Stairs-Number of Steps: 2 flights Home Layout: One level Home Equipment:  None      Prior Function Level of  Independence: Independent         Comments: PAstor of church     Hand Dominance   Dominant Hand: Right    Extremity/Trunk Assessment   Upper Extremity Assessment: Defer to OT evaluation RUE Deficits / Details: RUE generalized weakness. AROM WFL with the exception of wrist extension - able to hold in extension, but unable to actively extend. decreased in-hand manipulation skills Pt limited with shoulder movemetn due to weakness andc/o jont pain, which is new. Pt had fall at [facility], but unsure if shoulder injured during fall.  RUE: Unable to fully assess due to pain RUE Sensation: decreased light touch (Pt reports arm feels "like it's asleep")     Lower Extremity Assessment: RLE deficits/detail;Generalized weakness RLE Deficits / Details: Generalized weakness RLE however tolerated functional mobility/ambulation without knee buckling.     Cervical / Trunk Assessment: Normal  Communication   Communication: No difficulties  Cognition Arousal/Alertness: Awake/alert Behavior During Therapy: WFL for tasks assessed/performed Overall Cognitive Status: Within Functional Limits for tasks assessed                      General Comments General comments (skin integrity, edema, etc.): During gait, pt complained of increased tingling/pain in RUE in dependent position, however when RUE was supported during gait, symptoms improved/resolved. Not able to fully assess R shoulder as pt needed to go down for MRI.    Exercises        Assessment/Plan    PT Assessment Patient needs continued PT services  PT Diagnosis Generalized weakness;Abnormality of gait   PT Problem List Decreased strength;Cardiopulmonary status limiting activity;Impaired sensation;Decreased activity tolerance;Decreased balance;Decreased safety awareness;Decreased mobility  PT Treatment Interventions Balance training;Gait training;Neuromuscular re-education;Stair training;Patient/family education;Functional mobility  training;Therapeutic activities;Therapeutic exercise   PT Goals (Current goals can be found in the Care Plan section) Acute Rehab PT Goals Patient Stated Goal: to return to independence. PT Goal Formulation: With patient Time For Goal Achievement: 06/14/14 Potential to Achieve Goals: Good    Frequency Min 4X/week   Barriers to discharge Inaccessible home environment Pt has 2 flights of steps to enter apt.    Co-evaluation               End of Session Equipment Utilized During Treatment: Gait belt Activity Tolerance: Patient tolerated treatment well Patient left: in chair (w/c to go down for MRI) Nurse Communication: Mobility status;Precautions         Time: [id number] PT Time Calculation (min): 18 min   Charges:   PT Evaluation $Initial PT Evaluation Tier I: 1 Procedure PT Treatments $Gait Training: 8-22 mins   PT G CodesAlvie Heidelberg:          Folan, Shandreka Dante A 05/31/2014, 3:32 PM Alvie HeidelbergShauna Folan, PT, DPT [phone]

## 2014-05-31 NOTE — ED Notes (Signed)
Report given to [facility] staff 

## 2014-05-31 NOTE — Progress Notes (Signed)
Rehab Admissions Coordinator Note:  Patient was screened by Trish MageLogue, Noa Galvao M for appropriateness for an Inpatient Acute Rehab Consult.  At this time, we are recommending completion of workup with MRI.  If workup is positive for new CVA, then would recommending inpatient rehab consult.  Call me for questions.   Trish MageLogue, Yadira Hada M 05/31/2014, 3:26 PM  I can be reached at [phone].

## 2014-05-31 NOTE — Progress Notes (Signed)
Utilization review completed. Bridget , RN, BSN. 

## 2014-06-01 DIAGNOSIS — G5620 Lesion of ulnar nerve, unspecified upper limb: Secondary | ICD-10-CM

## 2014-06-01 DIAGNOSIS — G5621 Lesion of ulnar nerve, right upper limb: Secondary | ICD-10-CM

## 2014-06-01 LAB — LIPID PANEL
CHOL/HDL RATIO: 1.7 ratio
CHOLESTEROL: 119 mg/dL (ref 0–200)
HDL: 71 mg/dL (ref >39)
LDL Cholesterol: 39 mg/dL (ref 0–99)
Triglycerides: 44 mg/dL (ref <150)
VLDL: 9 mg/dL (ref 0–40)

## 2014-06-01 LAB — GLUCOSE, CAPILLARY
GLUCOSE-CAPILLARY: 87 mg/dL (ref 70–99)
Glucose-Capillary: 106 mg/dL — ABNORMAL HIGH (ref 70–99)
Glucose-Capillary: 83 mg/dL (ref 70–99)

## 2014-06-01 MED ORDER — TRAMADOL HCL 50 MG PO TABS
50.0000 mg | ORAL_TABLET | Freq: Four times a day (QID) | ORAL | Status: DC | PRN
  Administered 2014-06-01: 50 mg via ORAL
  Filled 2014-06-01: qty 1

## 2014-06-01 MED ORDER — ASPIRIN 325 MG PO TABS: 325.0000 mg | ORAL_TABLET | Freq: Every day | ORAL | Status: DC

## 2014-06-01 MED ORDER — TRAMADOL HCL 50 MG PO TABS: 50.0000 mg | ORAL_TABLET | Freq: Four times a day (QID) | ORAL | Status: DC | PRN

## 2014-06-01 NOTE — Progress Notes (Signed)
Occupational Therapy Treatment Patient Details Name: Brian Velez A Kinder MRN: [id number] DOB: 09/21/1965 Today's Date: 06/01/2014    History of present illness 48 y.o. male presents with chest pain, HA, and right sided weakness-numbness . CT -. CVA work up underway.[facility].   OT comments  Pt reports feeling much better today.  He feels he is at baseline with the exception of pins and needles sensation Rt. Hand.  Upon further assessment of Rt UE.  Pt has weakness digits 4 and 5, and impaired sensation along ulnar nerve distribution.  He has + Tinnel's sign at the elbow and associated pain along the lateral elbow and forearm.   Pt reports he keeps Rt. Elbow flexed or leans on Rt. Elbow much of the day and sleeps in fetal position, with elbows flexed at night.  MD made aware of above.  He will benefit from soft splint for elbow extension Rt. UE when sleeping as well as nerve gliding exercises and activity modification.   Also recommend referral to ortho for further work up.   Follow Up Recommendations  Outpatient OT (after OP ortho referral (MD making referral))    Equipment Recommendations  None recommended by OT    Recommendations for Other Services      Precautions / Restrictions Precautions Precautions: Fall       Mobility Bed Mobility Overal bed mobility: Independent       Supine to sit: Independent        Transfers Overall transfer level: Independent                    Balance Overall balance assessment: No apparent balance deficits (not formally assessed)   Sitting balance-Leahy Scale: Good     Standing balance support: During functional activity Standing balance-Leahy Scale: Good                     ADL       Grooming: Wash/dry hands;Wash/dry face;Oral care;Brushing hair;Standing;Independent   Upper Body Bathing: Independent;Standing   Lower Body Bathing: Independent;Sit to/from stand   Upper Body Dressing : Independent   Lower  Body Dressing: Independent   Toilet Transfer: Independent   Toileting- Clothing Manipulation and Hygiene: Independent       Functional mobility during ADLs: Modified independent General ADL Comments: Pt reports he feels better today.  He continues to c/o tingling Rt. hand.  He demonstrates decreased extension of digits 4 and 5.  Upon futher questioning, pt reports pins and needles sensation throughout the ulnar nerve distribution.   He has +Tinnel's at the elbow.   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   He drives frequently, leaning on his Rt. elbow and sleeps in fetal position with elbows fully flexed.  MD contacted and made aware.   Pt. is now independent with BADLs.  He was able to simulate shower in standing and tub transfer independently with no LOB       Vision                     Perception     Praxis      Cognition   Behavior During Therapy: [facility]FL for tasks assessed/performed Overall Cognitive Status: Within Functional Limits for tasks assessed  Extremity/Trunk Assessment               Exercises     Shoulder Instructions       General Comments      Pertinent Vitals/ Pain       Pain Assessment: 0-10 Pain Score: 4  Pain Location: Rt elbow Pain Descriptors / Indicators: Aching Pain Intervention(s): Monitored during session  Home Living                                          Prior Functioning/Environment              Frequency Min 2X/week     Progress Toward Goals  OT Goals(current goals can now be found in the care plan section)  Progress towards OT goals: Progressing toward goals  ADL Goals Pt Will Perform Lower Body Bathing: with modified independence;sit to/from stand Pt Will Perform Lower Body Dressing: with modified independence;sit to/from stand Pt Will  Transfer to Toilet: with modified independence;regular height toilet;ambulating Pt Will Perform Toileting - Clothing Manipulation and hygiene: with modified independence;sit to/from stand Pt Will Perform Tub/Shower Transfer: with modified independence;Tub transfer;ambulating  Plan Discharge plan needs to be updated    Co-evaluation                 End of Session     Activity Tolerance Patient tolerated treatment well   Patient Left in chair;with call bell/phone within reach   Nurse Communication Mobility status        Time: [id number] OT Time Calculation (min): 17 min  Charges: OT General Charges $OT Visit: 1 Procedure OT Treatments $Therapeutic Activity: 8-22 mins  Brian Velez M 06/01/2014, 11:29 AM

## 2014-06-01 NOTE — Progress Notes (Signed)
[name] for another PT Eval today as patient's weakness seems to have resolved. Mother had dementia type symptoms early 5260s.  Patient has no CVA on MRI.  Does have Premature/abnormal for age primarily white matter signal abnormality  of uncertain etiology- discussed with neurology- will have patient follow up as outpatient with neuro for further workup Patient's symptoms resolved, hope for d/c later today Brian CanaryJessica Marykathleen Velez

## 2014-06-01 NOTE — Progress Notes (Signed)
Occupational Therapy Note:  Pt has been provided with soft elbow extension splint for nighttime wear; elbow pad for daytime wear; He has been instructed in nerve gliding exercises, icing, and self massage, as well as activity modification. Written instruction of above provided.  Encouraged him to discuss pain management with MD.  All acute OT goals have been met.  Will discharge acute OT at this time.     06/01/14 1200  OT Visit Information  Last OT Received On 06/01/14  Assistance Needed +1  History of Present Illness 48 y.o. male presents with chest pain, HA, and right sided weakness-numbness . CT -. CVA work up underway.[facility].  OT Time Calculation  OT Start Time 1151  OT Stop Time 1246  OT Time Calculation (min) 55 min  Precautions  Precautions None  Pain Assessment  Pain Assessment 0-10  Pain Score 3  Pain Location Rt. elbow   Pain Descriptors / Indicators Aching  Pain Intervention(s) Monitored during session;Other (comment) (soft tissue mobilization )  Cognition  Arousal/Alertness Awake/alert  Behavior During Therapy WFL for tasks assessed/performed  Overall Cognitive Status Within Functional Limits for tasks assessed  Other Exercises  Other Exercises Pt instructed in ulnar nerve gliding exercises and performed x 3 - hand out with instruction provided  Other Exercises Soft tissue mobilization performed Rt. forearm.  Pt with multiple trigger points and subjective reduction in pain   Other Exercises Pt provided with soft Posey elbow extension splint for nighttime use and was instructed how to don/doff and written instruction provided about wear schedule  Other Exercises Pt provided with elbow pad for use during day to provide padding to elbow if he leans on Rt. elbow.   Other Exercises Pt provided with written information to avoid prolonged flexion of Rt. elbow, to avoid activiities that require repetitive elbow flexion; to avoid leanind on Rt. elbow; instructions on  icing and splint wear.  he was also instructed to massage forearm periodically.  Instructed him to discuss pain medication/anti inflammatories with MD.   OT - End of Session  Activity Tolerance Patient tolerated treatment well  Patient left in chair  Nurse Communication Mobility status  OT Assessment/Plan  OT Plan Discharge plan needs to be updated;All goals met and education completed, patient discharged from OT services  Follow Up Recommendations Outpatient OT (after OP ortho referral (MD making referral))  OT Equipment None recommended by OT  OT Goal Progression  Progress towards OT goals Goals met/education completed, patient discharged from OT  OT General Charges  $OT Visit 1 Procedure  OT Treatments  $Self Care/Home Management  8-22 mins  $Therapeutic Exercise 8-22 mins  $Massage 38-52 mins  $Sensory Integration 23-37 mins    , OTR/L [phone]  

## 2014-06-01 NOTE — Progress Notes (Signed)
Nutrition Brief Note  Patient identified on the Malnutrition Screening Tool (MST) Report  Wt Readings from Last 15 Encounters:  05/31/14 190 lb (86.183 kg)    Body mass index is 27.26 kg/(m^2). Patient meets criteria for overweight based on current BMI.   Current diet order is heart healthy, patient is consuming approximately 100% of meals at this time. Labs and medications reviewed.   No nutrition interventions warranted at this time. If nutrition issues arise, please consult RD.   Emmaline KluverHaley Kooper Godshall RD, LDN

## 2014-06-01 NOTE — Progress Notes (Signed)
*  PRELIMINARY RESULTS* Vascular Ultrasound Carotid Duplex (Doppler) has been completed.  Findings suggest 1-39% internal carotid artery stenosis bilaterally. Vertebral arteries are patent with antegrade flow.  06/01/2014 12:13 PM Gertie FeyMichelle Wessley Emert, RVT, RDCS, RDMS

## 2014-06-01 NOTE — Discharge Summary (Addendum)
Physician Discharge Summary  [name]:[id number] DOB: 1966/03/14 DOA: 05/31/2014  PCP: No PCP Per Patient  Admit date: 05/31/2014 Discharge date: 06/01/2014  Time spent: 35 minutes  Recommendations for Outpatient Follow-up:  1. Need to wear brace on arm and avoid keeping it bent 2. No driving while taking pain meds  Discharge Diagnoses:  Active Problems:   Chest pain   Ulnar nerve impingement   Discharge Condition: improved  Diet recommendation: cardiac  Filed Weights   05/31/14 0039  Weight: 86.183 kg (190 lb)    History of present illness:  Brian Velez is a 48 y.o. male  has a past medical history of Hypertension and MI (myocardial infarction).  Presented with  3-4 days of chest pain that is sharp worse with deep inspiration. Patient did have some recent travel and reports his left leg was swollen on Monday but it was mild and he thought it was related to prior history of surgery in that leg. Yesterday 10/21 patient started to have headache and right side weakness. He reports no trouble with walking, no slurred speech. Patient was out of town and did not want to seek medical attention. He presented to ER when he arrive home to [place]. CT head was unremarkable. D.dimer was negative.  Given right side weakness Neurology consult was called and recommended transfer to [facility].  Patient states that he has had in the past history of coronary artery disease. Although this wasn't his early age.  Hospitalist was called for admission for right hemiplegia possible TIA vs CVA   Hospital Course:  Chest pain- resolved, CE negative echo ok Ulnar nerve impingement- seen by OT, needs ortho follow up and brace to wear at night -ultram PRN Right sided weakness- work up negative for CVA- Mother had dementia type symptoms early 82s. Patient has no CVA on MRI. Does have Premature/abnormal for age primarily white matter signal abnormality  of uncertain etiology- discussed with  neurology- will have patient follow up as outpatient with neuro for further workup Hypoglycemia- had episode at admission but none since  Procedures:  Echo  carotids  Consultations:  neruo  Discharge Exam: Filed Vitals:   06/01/14 0400  BP: 136/83  Pulse: 49  Temp: 98.2 F (36.8 C)  Resp: 18    General: A+Ox3, NAD Cardiovascular: rrr Respiratory: clear  Discharge Instructions You were cared for by a hospitalist during your hospital stay. If you have any questions about your discharge medications or the care you received while you were in the hospital after you are discharged, you can call the unit and asked to speak with the hospitalist on call if the hospitalist that took care of you is not available. Once you are discharged, your primary care physician will handle any further medical issues. Please note that NO REFILLS for any discharge medications will be authorized once you are discharged, as it is imperative that you return to your primary care physician (or establish a relationship with a primary care physician if you do not have one) for your aftercare needs so that they can reassess your need for medications and monitor your lab values.  Discharge Instructions   Diet - low sodium heart healthy    Complete by:  As directed      Diet Carb Modified    Complete by:  As directed      Discharge instructions    Complete by:  As directed   Wear brace at night- try not to keep arm flexed/bent  Need to establish with PCP- need to follow up with ortho and neurology No driving while taking pain meds Can use ibuprofen as well for pain/nerve irritation     Increase activity slowly    Complete by:  As directed           Current Discharge Medication List    START taking these medications   Details  aspirin 325 MG tablet Take 1 tablet (325 mg total) by mouth daily.    traMADol (ULTRAM) 50 MG tablet Take 1 tablet (50 mg total) by mouth every 6 (six) hours as needed for  moderate pain. Qty: 30 tablet, Refills: 0       No Known Allergies Follow-up Information   Follow up with PENUMALLI,VIKRAM, MD In 1 month. (call for appointment)    Specialties:  Neurology, Radiology   Contact information:   [address] [place] [address] [phone]       Follow up with Karen ChafeGRAMIG III,WILLIAM M, MD. Call today.   Specialty:  Orthopedic Surgery   Contact information:   [address] [place] [address] [phone]       Please follow up. (need to establish with PCP here)        The results of significant diagnostics from this hospitalization (including imaging, microbiology, ancillary and laboratory) are listed below for reference.    Significant Diagnostic Studies: Ct Head Wo Contrast  05/31/2014   CLINICAL DATA:  Frontal headache for 3 days, chest pain radiating to RIGHT arm with heaviness. Hypoglycemia.  EXAM: CT HEAD WITHOUT CONTRAST  TECHNIQUE: Contiguous axial images were obtained from the base of the skull through the vertex without intravenous contrast.  COMPARISON:  None.  FINDINGS: The ventricles and sulci are normal. No intraparenchymal hemorrhage, mass effect nor midline shift. No acute large vascular territory infarcts.  No abnormal extra-axial fluid collections. Basal cisterns are patent.  No skull fracture. The included ocular globes and orbital contents are non-suspicious. The mastoid aircells and included paranasal sinuses are well-aerated. Dermal calcifications.  IMPRESSION: No acute intracranial process.   Electronically Signed   By: Awilda Metroourtnay  Bloomer   On: 05/31/2014 03:47   Mri Brain Without Contrast  05/31/2014   CLINICAL DATA:  RIGHT hemiparesis which began several days ago. This was associated with headache and chest pain. Neurologic exam did not correlate, suggesting the LEFT leg weakness.  EXAM: MRI HEAD WITHOUT CONTRAST  MRA HEAD WITHOUT CONTRAST  TECHNIQUE: Multiplanar, multiecho pulse sequences of the  brain and surrounding structures were obtained without intravenous contrast. Angiographic images of the head were obtained using MRA technique without contrast.  COMPARISON:  CT head 05/31/2014 was normal.  FINDINGS: MRI HEAD FINDINGS  No evidence for acute infarction, hemorrhage, mass lesion, hydrocephalus, or extra-axial fluid. Slight premature for age cerebral atrophy.  Abnormal for age periventricular greater than subcortical white matter signal abnormality, also involving the superior vermis within that structure's predominantly gray matter. Brainstem and cerebellum otherwise spared. No areas of lacunar infarction. No foci of chronic hemorrhage. None of these lesions demonstrate restricted diffusion. Differential considerations include hypertensive related small vessel disease, chronic infection, vasculitis, idiopathic, or demyelinating disease.  Flow voids are maintained. Normal midline structures. Mild cervical spondylosis. No sinus disease. Dysconjugate gaze. No osseous findings. Negative mastoids. Compared with CT, there is good general agreement. White matter lesions are not clearly visible.  MRA HEAD FINDINGS  The internal carotid arteries widely patent. The basilar artery is widely patent with vertebrals codominant. There is  no intracranial stenosis or aneurysm. Moderate dolichoectasia suggests longstanding hypertension.  IMPRESSION: Premature/abnormal for age primarily white matter signal abnormality of uncertain etiology. Correlate clinically. See comments above.  No   acute infarction or intracranial mass lesion.  Unremarkable MRA intracranial circulation.   Electronically Signed   By: Davonna Belling M.D.   On: 05/31/2014 16:43   Dg Chest Port 1 View  05/31/2014   CLINICAL DATA:  48 year old male with central chest pain and shortness of Breath. Initial encounter.  EXAM: PORTABLE CHEST - 1 VIEW  COMPARISON:  05/28/2013.  FINDINGS: Portable AP semi upright view at 0056 hrs. Mildly lower lung volumes.  Mild vascular congestion without overt edema. No pneumothorax, pleural effusion or confluent pulmonary opacity. Stable cardiac size and mediastinal contours. Visualized tracheal air column is within normal limits.  IMPRESSION: No acute cardiopulmonary abnormality.   Electronically Signed   By: Augusto Gamble M.D.   On: 05/31/2014 01:06   Mr Maxine Glenn Head/brain Wo Cm  05/31/2014   CLINICAL DATA:  RIGHT hemiparesis which began several days ago. This was associated with headache and chest pain. Neurologic exam did not correlate, suggesting the LEFT leg weakness.  EXAM: MRI HEAD WITHOUT CONTRAST  MRA HEAD WITHOUT CONTRAST  TECHNIQUE: Multiplanar, multiecho pulse sequences of the brain and surrounding structures were obtained without intravenous contrast. Angiographic images of the head were obtained using MRA technique without contrast.  COMPARISON:  CT head 05/31/2014 was normal.  FINDINGS: MRI HEAD FINDINGS  No evidence for acute infarction, hemorrhage, mass lesion, hydrocephalus, or extra-axial fluid. Slight premature for age cerebral atrophy.  Abnormal for age periventricular greater than subcortical white matter signal abnormality, also involving the superior vermis within that structure's predominantly gray matter. Brainstem and cerebellum otherwise spared. No areas of lacunar infarction. No foci of chronic hemorrhage. None of these lesions demonstrate restricted diffusion. Differential considerations include hypertensive related small vessel disease, chronic infection, vasculitis, idiopathic, or demyelinating disease.  Flow voids are maintained. Normal midline structures. Mild cervical spondylosis. No sinus disease. Dysconjugate gaze. No osseous findings. Negative mastoids. Compared with CT, there is good general agreement. White matter lesions are not clearly visible.  MRA HEAD FINDINGS  The internal carotid arteries widely patent. The basilar artery is widely patent with vertebrals codominant. There is no  intracranial stenosis or aneurysm. Moderate dolichoectasia suggests longstanding hypertension.  IMPRESSION: Premature/abnormal for age primarily white matter signal abnormality of uncertain etiology. Correlate clinically. See comments above.  No   acute infarction or intracranial mass lesion.  Unremarkable MRA intracranial circulation.   Electronically Signed   By: Davonna Belling M.D.   On: 05/31/2014 16:43    Microbiology: No results found for this or any previous visit (from the past 240 hour(s)).   Labs: Basic Metabolic Panel:  Recent Labs Lab 05/31/14 0044  NA 145  K 3.8  CL 104  CO2 28  GLUCOSE 39*  BUN 13  CREATININE 1.34  CALCIUM 9.1   Liver Function Tests: No results found for this basename: AST, ALT, ALKPHOS, BILITOT, PROT, ALBUMIN,  in the last 168 hours No results found for this basename: LIPASE, AMYLASE,  in the last 168 hours No results found for this basename: AMMONIA,  in the last 168 hours CBC:  Recent Labs Lab 05/31/14 0044  WBC 10.3  HGB 14.0  HCT 41.2  MCV 84.9  PLT 280   Cardiac Enzymes:  Recent Labs Lab 05/31/14 1020 05/31/14 1430 05/31/14 2157  TROPONINI <0.30 <0.30 <0.30   BNP: BNP (last  3 results)  Recent Labs  05/31/14 0045  PROBNP 64.9   CBG:  Recent Labs Lab 05/31/14 1212 05/31/14 1612 06/01/14 0043 06/01/14 0458 06/01/14 0751  GLUCAP 120* 78 106* 87 83       Signed:  Khadeejah Castner  [facility] 06/01/2014, 3:06 PM

## 2014-06-01 NOTE — Progress Notes (Signed)
Occupational Therapy Discharge Patient Details Name: Brian Velez MRN: [id number] DOB: 1966-03-20 Today's Date: 06/01/2014 Time: 3730-8168 OT Time Calculation (min): 55 min  Patient discharged from OT services secondary to goals met and no further acute  OT needs identified.  Please see latest therapy progress note for current level of functioning and progress toward goals.    Progress and discharge plan discussed with patient and/or caregiver:   Latham, Arona, OTR/L [phone]  06/01/2014, 12:59 PM

## 2016-03-24 ENCOUNTER — Ambulatory Visit: Payer: Self-pay | Admitting: Internal Medicine

## 2016-12-23 ENCOUNTER — Encounter (HOSPITAL_COMMUNITY): Payer: Self-pay | Admitting: Radiology

## 2016-12-23 ENCOUNTER — Emergency Department (HOSPITAL_COMMUNITY): Payer: 59

## 2016-12-23 ENCOUNTER — Emergency Department (HOSPITAL_COMMUNITY)
Admission: EM | Admit: 2016-12-23 | Discharge: 2016-12-24 | Disposition: A | Discharge status: Home / Self Care | Payer: 59 | Attending: Emergency Medicine | Admitting: Emergency Medicine

## 2016-12-23 DIAGNOSIS — R55 Syncope and collapse: Secondary | ICD-10-CM | POA: Insufficient documentation

## 2016-12-23 DIAGNOSIS — Z79899 Other long term (current) drug therapy: Secondary | ICD-10-CM | POA: Insufficient documentation

## 2016-12-23 DIAGNOSIS — Z8673 Personal history of transient ischemic attack (TIA), and cerebral infarction without residual deficits: Secondary | ICD-10-CM | POA: Insufficient documentation

## 2016-12-23 DIAGNOSIS — R079 Chest pain, unspecified: Secondary | ICD-10-CM | POA: Insufficient documentation

## 2016-12-23 DIAGNOSIS — I10 Essential (primary) hypertension: Secondary | ICD-10-CM | POA: Diagnosis not present

## 2016-12-23 LAB — CBC
HEMATOCRIT: 40.7 % (ref 39.0–52.0)
Hemoglobin: 13.8 g/dL (ref 13.0–17.0)
MCH: 28.7 pg (ref 26.0–34.0)
MCHC: 33.9 g/dL (ref 30.0–36.0)
MCV: 84.6 fL (ref 78.0–100.0)
Platelets: 258 10*3/uL (ref 150–400)
RBC: 4.81 MIL/uL (ref 4.22–5.81)
RDW: 13.2 % (ref 11.5–15.5)
WBC: 7.2 10*3/uL (ref 4.0–10.5)

## 2016-12-23 LAB — BASIC METABOLIC PANEL
ANION GAP: 8 (ref 5–15)
BUN: 10 mg/dL (ref 6–20)
CHLORIDE: 107 mmol/L (ref 101–111)
CO2: 25 mmol/L (ref 22–32)
Calcium: 8.8 mg/dL — ABNORMAL LOW (ref 8.9–10.3)
Creatinine, Ser: 1.25 mg/dL — ABNORMAL HIGH (ref 0.61–1.24)
GFR calc Af Amer: >60 mL/min (ref >60)
Glucose, Bld: 90 mg/dL (ref 65–99)
POTASSIUM: 3.7 mmol/L (ref 3.5–5.1)
SODIUM: 140 mmol/L (ref 135–145)

## 2016-12-23 LAB — I-STAT TROPONIN, ED: Troponin i, poc: 0 ng/mL (ref 0.00–0.08)

## 2016-12-23 MED ORDER — IOPAMIDOL (ISOVUE-370) INJECTION 76%
INTRAVENOUS | Status: AC
  Administered 2016-12-23: 100 mL
  Filled 2016-12-23: qty 100

## 2016-12-23 MED ORDER — ACETAMINOPHEN 325 MG PO TABS
650.0000 mg | ORAL_TABLET | Freq: Once | ORAL | Status: AC
  Administered 2016-12-23: 650 mg via ORAL
  Filled 2016-12-23: qty 2

## 2016-12-23 MED ORDER — SODIUM CHLORIDE 0.9 % IV BOLUS (SEPSIS)
1000.0000 mL | Freq: Once | INTRAVENOUS | Status: AC
Start: 2016-12-23 — End: 2016-12-24
  Administered 2016-12-23: 1000 mL via INTRAVENOUS

## 2016-12-23 NOTE — ED Triage Notes (Signed)
Pt c/o dizziness & hypertension while at church Sunday , since that time pt has had intermittent weakness &  Mid chest pressure that radiates to R arm, pt c/o SOB with ambulation, pt denies n/v/d, pt reports x 2 vomiting episodes last nigh & diarrhea yesterday, A&O x4

## 2016-12-24 MED ORDER — HYDROCHLOROTHIAZIDE 25 MG PO TABS: 25.0000 mg | ORAL_TABLET | Freq: Every day | ORAL | 0 refills | Status: DC

## 2016-12-24 MED ORDER — HYDROCHLOROTHIAZIDE 25 MG PO TABS
25.0000 mg | ORAL_TABLET | Freq: Every day | ORAL | Status: DC
  Filled 2016-12-24: qty 1

## 2016-12-25 ENCOUNTER — Encounter (HOSPITAL_COMMUNITY): Payer: Self-pay | Admitting: Emergency Medicine

## 2016-12-25 NOTE — ED Provider Notes (Signed)
[facility] Provider Note   CSN: [id number] Arrival date & time: 12/23/16  1536     History   Chief Complaint Chief Complaint  Patient presents with  . Chest Pain  . Near Syncope    HPI Brian Velez is a 51 y.o. male.   Chest Pain   This is a new problem. The current episode started more than 2 days ago. The problem occurs daily. The problem has not changed since onset.The pain is present in the substernal region. The pain is moderate. The quality of the pain is described as brief. The pain does not radiate. Associated symptoms include near-syncope. He has tried nothing for the symptoms. The treatment provided no relief.  Near Syncope  Associated symptoms include chest pain.  near syincopal episode after standing up quickly on Sunday at church. Headache since that time. Not happened before. No syncope since then. Intermittent right arm 'not feeling right'   Past Medical History:  Diagnosis Date  . History of gout   . Hypertension   . MI (myocardial infarction) (HCC) 2004  . Sleep apnea    "don't wear mask anymore"  . Stroke [facility]) 05/27/2014   "right side still weaker" (05/31/2014)    Patient Active Problem List   Diagnosis Date Noted  . 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 Right hemiplegia (HCC) 05/31/2014  . Chest pain 05/31/2014    Past Surgical History:  Procedure Laterality Date  . ANKLE FRACTURE SURGERY Left 2000's  . CARDIAC CATHETERIZATION  2004; 2014  . FRACTURE SURGERY    . ROUX-EN-Y GASTRIC BYPASS  2013       Home Medications    Prior to Admission medications   Medication Sig Start Date End Date Taking? Authorizing Provider  hydrochlorothiazide (HYDRODIURIL) 25 MG tablet Take 1 tablet (25 mg total) by mouth daily. 12/24/16   Brian Velez, Barbara Cower, MD    Family History Family History  Problem Relation Age of Onset  . Dementia Mother   . Breast cancer Mother   . Hypertension Mother   . CAD Father   . Stroke Father   . Prostate cancer  Father   . Diabetes type II Sister   . Diabetes type II Brother   . Diabetes type II Sister   . Diabetes type II Sister   . Stroke Brother     Social History Social History  Substance Use Topics  . Smoking status: Never Smoker  . Smokeless tobacco: Never Used  . Alcohol use No     Allergies   Patient has no known allergies.   Review of Systems Review of Systems  Cardiovascular: Positive for chest pain and near-syncope.  All other systems reviewed and are negative.    Physical Exam Updated Vital Signs BP (!) 143/75 (BP Location: Right Arm)   Pulse (!) 47   Temp 98.5 F (36.9 C) (Oral)   Resp 19   Ht 5\' 9"  (1.753 m)   Wt 205 lb (93 kg)   SpO2 99%   BMI 30.27 kg/m   Physical Exam  Constitutional: He is oriented to person, place, and time. He appears well-developed and well-nourished.  HENT:  Head: Normocephalic and atraumatic.  Eyes: Conjunctivae and EOM are normal.  Neck: Normal range of motion.  Cardiovascular: Normal rate and regular rhythm.  Exam reveals no friction rub.   No murmur heard. Pulmonary/Chest: Effort normal. No respiratory distress. He exhibits no tenderness.  Abdominal: He exhibits no distension.  Musculoskeletal: Normal range of motion.  Neurological:  He is alert and oriented to person, place, and time.  No altered mental status, able to give full seemingly accurate history.  Face is symmetric, EOM's intact, pupils equal and reactive, vision intact, tongue and uvula midline without deviation Upper and Lower extremity motor 5/5, intact pain perception in distal extremities, 2+ reflexes in biceps, patella and achilles tendons. Finger to nose normal, heel to shin normal. Walks without assistance or evident ataxia.    Nursing note and vitals reviewed.    ED Treatments / Results  Labs (all labs ordered are listed, but only abnormal results are displayed) Labs Reviewed  BASIC METABOLIC PANEL - Abnormal; Notable for the following:        Result Value   Creatinine, Ser 1.25 (*)    Calcium 8.8 (*)    All other components within normal limits  CBC  I-STAT TROPOININ, ED    EKG  EKG Interpretation  Date/Time:  Wednesday Dec 23 2016 15:55:23 EDT Ventricular Rate:  62 PR Interval:  162 QRS Duration: 136 QT Interval:  462 QTC Calculation: 468 R Axis:   86 Text Interpretation:  Normal sinus rhythm Right bundle branch block Abnormal ECG rbbb new, otherwise no changes Confirmed by Brian Velez [phone]) on 12/23/2016 8:51:30 PM       Radiology Dg Chest 2 View  Result Date: 12/23/2016 CLINICAL DATA:  Initial evaluation for acute mid chest pain/ pressure were shortness of breath for 3 days. EXAM: CHEST  2 VIEW COMPARISON:  Prior radiograph from 05/31/2014. FINDINGS: The cardiac and mediastinal silhouettes are stable in size and contour, and remain within normal limits. The lungs are normally inflated. No airspace consolidation, pleural effusion, or pulmonary edema is identified. There is no pneumothorax. No acute osseous abnormality identified. IMPRESSION: No active cardiopulmonary disease. Electronically Signed   By: Brian Velez M.D.   On: 12/23/2016 16:42   Ct Head Wo Contrast  Result Date: 12/23/2016 CLINICAL DATA:  Headache.  Nausea and vomiting.  Hypertension. EXAM: CT HEAD WITHOUT CONTRAST TECHNIQUE: Contiguous axial images were obtained from the base of the skull through the vertex without intravenous contrast. COMPARISON:  Head CT and brain MRI 05/31/2014 FINDINGS: Brain: No evidence of acute infarction, hemorrhage, hydrocephalus, extra-axial collection or mass lesion/mass effect. White matter changes on prior MRI are not well seen by CT. Vascular: No hyperdense vessel or unexpected calcification. Skull: Normal. Negative for fracture or focal lesion. Sinuses/Orbits: Paranasal sinuses and mastoid air cells are clear. The visualized orbits are unremarkable. Other: None IMPRESSION: No intracranial bleed.  No acute  intracranial abnormality. Electronically Signed   By: Brian Velez M.D.   On: 12/23/2016 22:48   Ct Angio Chest/abd/pel For Dissection W And/or Wo Contrast  Result Date: 12/23/2016 CLINICAL DATA:  Initial evaluation for nausea and vomiting for 4 days. EXAM: CT ANGIOGRAPHY CHEST, ABDOMEN AND PELVIS TECHNIQUE: Multidetector CT imaging through the chest, abdomen and pelvis was performed using the standard protocol during bolus administration of intravenous contrast. Multiplanar reconstructed images and MIPs were obtained and reviewed to evaluate the vascular anatomy. CONTRAST:  100 cc of Isovue 370. COMPARISON:  Prior radiograph from earlier same day. FINDINGS: CTA CHEST FINDINGS Cardiovascular: Precontrast imaging through the intrathoracic aorta out demonstrates no acute abnormality. No significant atheromatous plaque. Postcontrast imaging demonstrates no evidence for dissection or other acute abnormality. No aneurysm. Visualized great vessels within normal limits. Heart size at the upper limits of normal. No pericardial effusion. Limited evaluation of the pulmonary arterial tree grossly unremarkable. Lungs/Pleura: Mild hazy subsegmental  atelectatic changes present dependently within the visualized lung bases. Visualized lungs are otherwise clear. No other focal infiltrates. No pulmonary edema or pleural effusion. No pneumothorax. No worrisome pulmonary nodule or mass. Mediastinum/Nodes: Visualized thyroid normal. No pathologically enlarged mediastinal, hilar, or axillary lymph nodes identified. Esophagus within normal limits. Musculoskeletal: No acute osseous abnormality. No worrisome lytic or blastic osseous lesions. Review of the MIP images confirms the above findings. CTA ABDOMEN AND PELVIS FINDINGS VASCULAR Aorta: Intra-abdominal aorta of normal caliber without evidence for dissection or other acute abnormality. No significant atheromatous plaque identified. Celiac: Anatomic variant with separate origin  of the splenic artery from the intra-abdominal aorta. The origin of the splenic artery is widely patent. Celiac axis comes off separately and slightly superiorly to the splenic artery. Branch vessels of this iliac are widely patent. SMA: SMA arises from the celiac axis. SMA is widely patent to its distal aspect. Renals: Single renal arteries present bilaterally, both of which are widely patent. IMA: IMA widely patent. Inflow: Iliac arteries are widely patent bilaterally. Veins: No appreciable venous abnormality. Review of the MIP images confirms the above findings. NON-VASCULAR Hepatobiliary: Liver within normal limits. Gallbladder normal. No biliary dilatation. Pancreas: Pancreas within normal limits. Spleen: Spleen within normal limits. Adrenals/Urinary Tract: Adrenal glands are normal. Kidneys equal in size with symmetric enhancement. No nephrolithiasis, hydronephrosis, or focal enhancing renal mass. No hydroureter. Partially is distended bladder within normal limits. Stomach/Bowel: Postsurgical changes noted about the GE junction. Stomach otherwise unremarkable. No evidence for bowel obstruction. Appendix normal. No acute inflammatory changes seen about the bowels. Lymphatic: No adenopathy. Reproductive: Prostate within normal limits. Other: No free air. Trace free fluid noted within the pelvis, indeterminate, but may be reactive. Musculoskeletal: No acute osseus abnormality. No worrisome lytic or blastic osseous lesions. Multilevel facet arthrosis noted within lumbar spine. Review of the MIP images confirms the above findings. IMPRESSION: 1. No CTA evidence for aortic dissection or other acute abnormality. 2. 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 3. Trace free fluid within the pelvis, of uncertain etiology or significance, but may be reactive in nature. 4. No other acute intra-abdominal or pelvic process identified.  5. Postsurgical changes about the GE junction. Electronically Signed   By: Brian Velez M.D.   On: 12/23/2016 23:19    Procedures Procedures (including critical care time)  Medications Ordered in ED Medications  acetaminophen (TYLENOL) tablet 650 mg (650 mg Oral Given 12/23/16 2144)  sodium chloride 0.9 % bolus 1,000 mL (0 mLs Intravenous Stopped 12/24/16 0100)  iopamidol (ISOVUE-370) 76 % injection (100 mLs  Contrast Given 12/23/16 2226)     Initial Impression / Assessment and Plan / ED Course  I have reviewed the triage vital signs and the nursing notes.  Pertinent labs & imaging results that were available during my care of the patient were reviewed by me and considered in my medical decision making (see chart for details).     Multiple non specific symptoms initially concerning for possible head bleed vs dissection but ct's unremarkable. Cardiac workup negative. Unsure of cuase of syncope, likely needs further workup but likely related to vasovagal cause from standing up too quick.  Also with HTN possibly related to symptoms so started HCTZ to be followed by a primary provider.   Final Clinical Impressions(s) / ED Diagnoses   Final diagnoses:  Near syncope  Nonspecific chest pain  Hypertension, unspecified type    New Prescriptions  Discharge Medication List as of 12/24/2016  1:02 AM    START taking these medications   Details  hydrochlorothiazide (HYDRODIURIL) 25 MG tablet Take 1 tablet (25 mg total) by mouth daily., Starting Thu 12/24/2016, Print         Ruari Mudgett, Barbara CowerJason, MD 12/25/16 1213

## 2017-03-22 ENCOUNTER — Encounter (HOSPITAL_COMMUNITY): Payer: Self-pay | Admitting: *Deleted

## 2017-03-22 ENCOUNTER — Ambulatory Visit (HOSPITAL_COMMUNITY)
Admission: EM | Admit: 2017-03-22 | Discharge: 2017-03-22 | Disposition: A | Discharge status: Home / Self Care | Payer: 59 | Attending: Family Medicine | Admitting: Family Medicine

## 2017-03-22 DIAGNOSIS — M7989 Other specified soft tissue disorders: Secondary | ICD-10-CM

## 2017-03-22 NOTE — Discharge Instructions (Signed)
Please call to make a follow up appointment with an orthopaedic surgeon.

## 2017-03-22 NOTE — ED Triage Notes (Signed)
Pt  Noticed  Pain   Swelling and  Redness  Around the  r  Middle  Finger  Noticed  Today   Denies  Any  Injury   Speaking in  Complete   sentances  And  Is  In no  Acute   Does not bite  His  Fingernails

## 2017-03-24 NOTE — ED Provider Notes (Signed)
  [facility]   [id number] 03/22/17 Arrival Time: 1045  ASSESSMENT & PLAN:  1. Swelling of right middle finger    Discussed that he is showing the same physical signs as someone with an extensor tendon disruption. Splinted in extension. Recommend orthopaedic follow up to evaluate. He will schedule.  Reviewed expectations re: course of current medical issues. Questions answered. Outlined signs and symptoms indicating need for more acute intervention. Patient verbalized understanding. After Visit Summary given.   SUBJECTIVE:  Brian Velez is a 51 y.o. male who presents with complaint of swelling of his R 3rd finger. No injury or trauma. "Just started swelling a little. Can't move the tip of my finger well". No sensation changes. No skin redness. No h/o similar.  ROS: As per HPI.   OBJECTIVE:  Vitals:   03/22/17 1127 03/22/17 1130  BP: 128/80 120/70  Pulse: 78 78  Resp: 18 18  Temp: 98.6 F (37 C) 98.6 F (37 C)  TempSrc: Oral Oral  SpO2: 100% 100%     General appearance: alert; no distress Extremities: R 3rd finger is slightly swollen; no erythema; good ROM at MCP and PIP joints with normal strength; when DIP joint isolated he is unable to extend fingertip Skin: warm and dry Neurologic: normal symmetric reflexes and normal sensation in extremities Psychological:  alert and cooperative; normal mood and affect   No Known Allergies  PMHx, SurgHx, SocialHx, Medications, and Allergies were reviewed in the Visit Navigator and updated as appropriate.      Mardella LaymanHagler, Phyllicia Dudek, MD 03/24/17 1000

## 2017-09-28 ENCOUNTER — Telehealth: Payer: Self-pay | Admitting: Medical

## 2017-09-28 NOTE — Telephone Encounter (Signed)
Records received for a new pt whose appt is in march. Sending back for review.

## 2017-10-19 ENCOUNTER — Encounter: Payer: Self-pay | Admitting: Medical

## 2017-10-19 ENCOUNTER — Ambulatory Visit (INDEPENDENT_AMBULATORY_CARE_PROVIDER_SITE_OTHER): Payer: 59 | Admitting: Medical

## 2017-10-19 VITALS — BP 148/90 | HR 56 | Ht 69.0 in | Wt 199.0 lb

## 2017-10-19 DIAGNOSIS — Z1211 Encounter for screening for malignant neoplasm of colon: Secondary | ICD-10-CM | POA: Diagnosis not present

## 2017-10-19 DIAGNOSIS — Z1159 Encounter for screening for other viral diseases: Secondary | ICD-10-CM | POA: Insufficient documentation

## 2017-10-19 DIAGNOSIS — Z8042 Family history of malignant neoplasm of prostate: Secondary | ICD-10-CM

## 2017-10-19 DIAGNOSIS — Z800 Family history of malignant neoplasm of digestive organs: Secondary | ICD-10-CM

## 2017-10-19 DIAGNOSIS — Z833 Family history of diabetes mellitus: Secondary | ICD-10-CM

## 2017-10-19 DIAGNOSIS — Z8249 Family history of ischemic heart disease and other diseases of the circulatory system: Secondary | ICD-10-CM | POA: Diagnosis not present

## 2017-10-19 DIAGNOSIS — I10 Essential (primary) hypertension: Secondary | ICD-10-CM | POA: Diagnosis not present

## 2017-10-19 MED ORDER — PRAVASTATIN SODIUM 20 MG PO TABS: 20.0000 mg | ORAL_TABLET | Freq: Every evening | ORAL | 2 refills | Status: DC

## 2017-10-19 MED ORDER — OLMESARTAN MEDOXOMIL 20 MG PO TABS: 20.0000 mg | ORAL_TABLET | Freq: Every day | ORAL | 2 refills | Status: DC

## 2017-10-19 NOTE — Progress Notes (Signed)
Subjective:  Brian Velez is a 52 y.o. male who presents as a new patient today.  Mother passed 4 years ago, was living in [place], [place].  However he moved back to [place].   Sister is Brian Velez, my patient.    Here to establish care.   Both parents had HTN, all siblings diabetic, on insulin.  Felt like he needed to come in and get checked out.  Mother had 2 MIs, both parents had cancer.   201/180 BP in [facility], was in hospital 2 days for hypertensive urgency and dizziness.  Not currently on BP medication.  Doing fine currently, no chest pain, no palpations, no SOB.     No other aggravating or relieving factors.    No other c/o.  The following portions of the patient's history were reviewed and updated as appropriate: allergies, current medications, past family history, past medical history, past social history, past surgical history and problem list.  ROS Otherwise as in subjective above  Past Medical History:  Diagnosis Date  . History of gout   . Hypertension   . MI (myocardial infarction) (HCC) 2004  . Sleep apnea    "don't wear mask anymore"  . Stroke (HCC) 05/27/2014   "right side still weaker" (05/31/2014)   Current Outpatient Medications on File Prior to Visit  Medication Sig Dispense Refill  . hydrochlorothiazide (HYDRODIURIL) 25 MG tablet Take 1 tablet (25 mg total) by mouth daily. (Patient not taking: Reported on 10/19/2017) 30 tablet 0   No current facility-administered medications on file prior to visit.    Family History  Problem Relation Age of Onset  . Dementia Mother        died of dementia  . Breast cancer Mother   . Hypertension Mother   . Heart disease Mother   . Cancer Mother        breast  . CAD Father   . Stroke Father   . Prostate cancer Father 31  . Cancer Father 43       colon  . Hypertension Father   . Diabetes type II Sister   . Hypertension Sister   . Diabetes type II Brother   . Hypertension  Brother   . Diabetes type II Sister   . Hypertension Sister   . Diabetes type II Sister   . Hypertension Sister   . Stroke Brother       Objective: BP (!) 148/90   Pulse (!) 56   Ht 5\' 9"  (1.753 m)   Wt 199 lb (90.3 kg)   SpO2 98%   BMI 29.39 kg/m   General appearance: alert, no distress, WD/WN, AA male Neck: supple, no lymphadenopathy, no thyromegaly, no masses, no bruits Heart: RRR, normal S1, S2, no murmurs Lungs: CTA bilaterally, no wheezes, rhonchi, or rales Pulses: 2+ radial pulses, 2+ pedal pulses, normal cap refill Ext: no edema Neuro: non focal exam    Assessment: Encounter Diagnoses  Name Primary?  . Essential hypertension Yes  . Family history of hypertension   . Family history of prostate cancer   . Family history of colon cancer   . Family history of diabetes mellitus   . Screen for colon cancer     Plan: Hypertension-discussed risk and benefits of medication, begin medication as below.  Discussed possible complications of hypertension.  The chart record shows a history of myocardial infarction in the remote past.  Therefore also asked him to start statin at bedtime and aspirin 81 mg daily.  Referral for colonoscopy given family history of colon cancer  Advised to return in 1 month for a physical fasting labs and recheck  Follow up: 51mo for physical  Sharma Covertorman was seen today for new patient (initial visit).  Diagnoses and all orders for this visit:  Essential hypertension  Family history of hypertension  Family history of prostate cancer  Family history of colon cancer -     Ambulatory referral to Gastroenterology  Family history of diabetes mellitus  Screen for colon cancer -     Ambulatory referral to Gastroenterology  Other orders -     olmesartan (BENICAR) 20 MG tablet; Take 1 tablet (20 mg total) by mouth daily. -     pravastatin (PRAVACHOL) 20 MG tablet; Take 1 tablet (20 mg total) by mouth every evening.

## 2017-10-21 ENCOUNTER — Encounter: Payer: Self-pay | Admitting: Gastroenterology

## 2017-11-19 ENCOUNTER — Ambulatory Visit (INDEPENDENT_AMBULATORY_CARE_PROVIDER_SITE_OTHER): Payer: 59 | Admitting: Medical

## 2017-11-19 ENCOUNTER — Encounter: Payer: Self-pay | Admitting: Medical

## 2017-11-19 ENCOUNTER — Telehealth: Payer: Self-pay

## 2017-11-19 VITALS — BP 134/90 | HR 60 | Ht 68.25 in | Wt 199.4 lb

## 2017-11-19 DIAGNOSIS — Z1211 Encounter for screening for malignant neoplasm of colon: Secondary | ICD-10-CM | POA: Diagnosis not present

## 2017-11-19 DIAGNOSIS — Z23 Encounter for immunization: Secondary | ICD-10-CM | POA: Diagnosis not present

## 2017-11-19 DIAGNOSIS — Z8673 Personal history of transient ischemic attack (TIA), and cerebral infarction without residual deficits: Secondary | ICD-10-CM | POA: Diagnosis not present

## 2017-11-19 DIAGNOSIS — Z0000 Encounter for general adult medical examination without abnormal findings: Secondary | ICD-10-CM

## 2017-11-19 DIAGNOSIS — Z8249 Family history of ischemic heart disease and other diseases of the circulatory system: Secondary | ICD-10-CM

## 2017-11-19 DIAGNOSIS — G5620 Lesion of ulnar nerve, unspecified upper limb: Secondary | ICD-10-CM

## 2017-11-19 DIAGNOSIS — Z7189 Other specified counseling: Secondary | ICD-10-CM | POA: Insufficient documentation

## 2017-11-19 DIAGNOSIS — G8191 Hemiplegia, unspecified affecting right dominant side: Secondary | ICD-10-CM

## 2017-11-19 DIAGNOSIS — Z125 Encounter for screening for malignant neoplasm of prostate: Secondary | ICD-10-CM

## 2017-11-19 DIAGNOSIS — I10 Essential (primary) hypertension: Secondary | ICD-10-CM | POA: Diagnosis not present

## 2017-11-19 DIAGNOSIS — Z7185 Encounter for immunization safety counseling: Secondary | ICD-10-CM | POA: Insufficient documentation

## 2017-11-19 DIAGNOSIS — I252 Old myocardial infarction: Secondary | ICD-10-CM

## 2017-11-19 DIAGNOSIS — Z800 Family history of malignant neoplasm of digestive organs: Secondary | ICD-10-CM | POA: Diagnosis not present

## 2017-11-19 DIAGNOSIS — Z1159 Encounter for screening for other viral diseases: Secondary | ICD-10-CM

## 2017-11-19 DIAGNOSIS — Z833 Family history of diabetes mellitus: Secondary | ICD-10-CM

## 2017-11-19 DIAGNOSIS — I519 Heart disease, unspecified: Secondary | ICD-10-CM

## 2017-11-19 DIAGNOSIS — Z8739 Personal history of other diseases of the musculoskeletal system and connective tissue: Secondary | ICD-10-CM

## 2017-11-19 DIAGNOSIS — G4700 Insomnia, unspecified: Secondary | ICD-10-CM

## 2017-11-19 MED ORDER — TRIAMCINOLONE ACETONIDE 0.1 % EX CREA: 1.0000 "application " | TOPICAL_CREAM | Freq: Two times a day (BID) | CUTANEOUS | 0 refills | Status: DC

## 2017-11-19 MED ORDER — ZOLPIDEM TARTRATE 5 MG PO TABS: 5.0000 mg | ORAL_TABLET | Freq: Every evening | ORAL | 0 refills | Status: DC | PRN

## 2017-11-19 NOTE — Patient Instructions (Signed)
Thanks for trusting us with your health care and for coming in for a physical today.  Below are some general recommendations I have for you:  Yearly screenings See your eye doctor yearly for routine vision care. See your dentist yearly for routine dental care including hygiene visits twice yearly. See me here yearly for a routine physical and preventative care visit   Specific Concerns today:  . We will set you up for an echocardiogram heart study since its been 4 years since last one . Follow up with gastroenterology for repeat colonoscopy consult as planned . We will call with lab results . Continue regular exercise and healthy diet . Shingles vaccine:  I recommend you have a shingles vaccine to help prevent shingles or herpes zoster outbreak.   Please call your insurer to inquire about coverage for the Shingrix vaccine given in 2 doses.   Some insurers cover this vaccine after age 51, some cover this after age 51.  If your insurer covers this, then call to schedule appointment to have this vaccine here. Pneumococcal 23 vaccine:  I recommend you have a pneumococcal vaccine to help prevent 23 different kinds of bacterial pneumonia.  Please call your insurer to inquire about coverage for the Pneumococcal 23 vaccine.   If your insurer covers this, then call to schedule appointment to have this vaccine here. Begin cream for the skin lesion of right chest.   If this doesn't resolve within 2-3 weeks, then recheck for biopsy Begin short term trial of zolpidem for sleep aid Insomnia Insomnia is a sleep disorder that makes it difficult to fall asleep or to stay asleep. Insomnia can cause tiredness (fatigue), low energy, difficulty concentrating, mood swings, and poor performance at work or school. There are three different ways to classify insomnia: Difficulty falling asleep. Difficulty staying asleep. Waking up too early in the morning.  Any type of insomnia can be long-term (chronic) or  short-term (acute). Both are common. Short-term insomnia usually lasts for three months or less. Chronic insomnia occurs at least three times a week for longer than three months. What are the causes? Insomnia may be caused by another condition, situation, or substance, such as: Anxiety. Certain medicines. Gastroesophageal reflux disease (GERD) or other gastrointestinal conditions. Asthma or other breathing conditions. Restless legs syndrome, sleep apnea, or other sleep disorders. Chronic pain. Menopause. This may include hot flashes. Stroke. Abuse of alcohol, tobacco, or illegal drugs. Depression. Caffeine. Neurological disorders, such as Alzheimer disease. An overactive thyroid (hyperthyroidism).  The cause of insomnia may not be known. What increases the risk? Risk factors for insomnia include: Gender. Women are more commonly affected than men. Age. Insomnia is more common as you get older. Stress. This may involve your professional or personal life. Income. Insomnia is more common in people with lower income. Lack of exercise. Irregular work schedule or night shifts. Traveling between different time zones.  What are the signs or symptoms? If you have insomnia, trouble falling asleep or trouble staying asleep is the main symptom. This may lead to other symptoms, such as: Feeling fatigued. Feeling nervous about going to sleep. Not feeling rested in the morning. Having trouble concentrating. Feeling irritable, anxious, or depressed.  How is this treated? Treatment for insomnia depends on the cause. If your insomnia is caused by an underlying condition, treatment will focus on addressing the condition. Treatment may also include: Medicines to help you sleep. Counseling or therapy. Lifestyle adjustments.  Follow these instructions at home: Take medicines only as  directed by your health care provider. Keep regular sleeping and waking hours. Avoid naps. Keep a sleep diary to  help you and your health care provider figure out what could be causing your insomnia. Include: When you sleep. When you wake up during the night. How well you sleep. How rested you feel the next day. Any side effects of medicines you are taking. What you eat and drink. Make your bedroom a comfortable place where it is easy to fall asleep: Put up shades or special blackout curtains to block light from outside. Use a white noise machine to block noise. Keep the temperature cool. Exercise regularly as directed by your health care provider. Avoid exercising right before bedtime. Use relaxation techniques to manage stress. Ask your health care provider to suggest some techniques that may work well for you. These may include: Breathing exercises. Routines to release muscle tension. Visualizing peaceful scenes. Cut back on alcohol, caffeinated beverages, and cigarettes, especially close to bedtime. These can disrupt your sleep. Do not overeat or eat spicy foods right before bedtime. This can lead to digestive discomfort that can make it hard for you to sleep. Limit screen use before bedtime. This includes: Watching TV. Using your smart phone, tablet, and computer. Stick to a routine. This can help you fall asleep faster. Try to do a quiet activity, brush your teeth, and go to bed at the same time each night. Get out of bed if you are still awake after 15 minutes of trying to sleep. Keep the lights down, but try reading or doing a quiet activity. When you feel sleepy, go back to bed. Make sure that you drive carefully. Avoid driving if you feel very sleepy. Keep all follow-up appointments as directed by your health care provider. This is important. Contact a health care provider if: You are tired throughout the day or have trouble in your daily routine due to sleepiness. You continue to have sleep problems or your sleep problems get worse. Get help right away if: You have serious thoughts about  hurting yourself or someone else. This information is not intended to replace advice given to you by your health care provider. Make sure you discuss any questions you have with your health care provider. Document Released: 07/24/2000 Document Revised: 12/27/2015 Document Reviewed: 04/27/2014 Elsevier Interactive Patient Education  2018 [employer].   Please follow up yearly for a physical.   Preventative Care for Adults - Male      MAINTAIN REGULAR HEALTH EXAMS:  A routine yearly physical is a good way to check in with your primary care provider about your health and preventive screening. It is also an opportunity to share updates about your health and any concerns you have, and receive a thorough all-over exam.   Most health insurance companies pay for at least some preventative services.  Check with your health plan for specific coverages.  WHAT PREVENTATIVE SERVICES DO WOMEN NEED?  Adult men should have their weight and blood pressure checked regularly.   Men age 51 and older should have their cholesterol levels checked regularly.  Beginning at age 68 and continuing to age 22, men should be screened for colorectal cancer.  Certain people may need continued testing until age 68.  Updating vaccinations is part of preventative care.  Vaccinations help protect against diseases such as the flu.  Osteoporosis is a disease in which the bones lose minerals and strength as we age. Men ages 8 and over should discuss this with their caregivers  Lab tests are generally done as part of preventative care to screen for anemia and blood disorders, to screen for problems with the kidneys and liver, to screen for bladder problems, to check blood sugar, and to check your cholesterol level.  Preventative services generally include counseling about diet, exercise, avoiding tobacco, drugs, excessive alcohol consumption, and sexually transmitted infections.    GENERAL RECOMMENDATIONS FOR GOOD  HEALTH:  Healthy diet:  Eat a variety of foods, including fruit, vegetables, animal or vegetable protein, such as meat, fish, chicken, and eggs, or beans, lentils, tofu, and grains, such as rice.  Drink plenty of water daily.  Decrease saturated fat in the diet, avoid lots of red meat, processed foods, sweets, fast foods, and fried foods.  Exercise:  Aerobic exercise helps maintain good heart health. At least 30-40 minutes of moderate-intensity exercise is recommended. For example, a brisk walk that increases your heart rate and breathing. This should be done on most days of the week.   Find a type of exercise or a variety of exercises that you enjoy so that it becomes a part of your daily life.  Examples are running, walking, swimming, water aerobics, and biking.  For motivation and support, explore group exercise such as aerobic class, spin class, Zumba, Yoga,or  martial arts, etc.    Set exercise goals for yourself, such as a certain weight goal, walk or run in a race such as a 5k walk/run.  Speak to your primary care provider about exercise goals.  Disease prevention:  If you smoke or chew tobacco, find out from your caregiver how to quit. It can literally save your life, no matter how long you have been a tobacco user. If you do not use tobacco, never begin.   Maintain a healthy diet and normal weight. Increased weight leads to problems with blood pressure and diabetes.   The Body Mass Index or BMI is a way of measuring how much of your body is fat. Having a BMI above 27 increases the risk of heart disease, diabetes, hypertension, stroke and other problems related to obesity. Your caregiver can help determine your BMI and based on it develop an exercise and dietary program to help you achieve or maintain this important measurement at a healthful level.  High blood pressure causes heart and blood vessel problems.  Persistent high blood pressure should be treated with medicine if weight  loss and exercise do not work.   Fat and cholesterol leaves deposits in your arteries that can block them. This causes heart disease and vessel disease elsewhere in your body.  If your cholesterol is found to be high, or if you have heart disease or certain other medical conditions, then you may need to have your cholesterol monitored frequently and be treated with medication.   Ask if you should have a cardiac stress test if your history suggests this. A stress test is a test done on a treadmill that looks for heart disease. This test can find disease prior to there being a problem.  Osteoporosis is a disease in which the bones lose minerals and strength as we age. This can result in serious bone fractures. Risk of osteoporosis can be identified using a bone density scan. Men ages 47 and over should discuss this with their caregivers. Ask your caregiver whether you should be taking a calcium supplement and Vitamin D, to reduce the rate of osteoporosis.   Avoid drinking alcohol in excess (more than two drinks per day).  Avoid  use of street drugs. Do not share needles with anyone. Ask for professional help if you need assistance or instructions on stopping the use of alcohol, cigarettes, and/or drugs.  Brush your teeth twice a day with fluoride toothpaste, and floss once a day. Good oral hygiene prevents tooth decay and gum disease. The problems can be painful, unattractive, and can cause other health problems. Visit your dentist for a routine oral and dental check up and preventive care every 6-12 months.   Look at your skin regularly.  Use a mirror to look at your back. Notify your caregivers of changes in moles, especially if there are changes in shapes, colors, a size larger than a pencil eraser, an irregular border, or development of new moles.  Safety:  Use seatbelts 100% of the time, whether driving or as a passenger.  Use safety devices such as hearing protection if you work in environments  with loud noise or significant background noise.  Use safety glasses when doing any work that could send debris in to the eyes.  Use a helmet if you ride a bike or motorcycle.  Use appropriate safety gear for contact sports.  Talk to your caregiver about gun safety.  Use sunscreen with a SPF (or skin protection factor) of 15 or greater.  Lighter skinned people are at a greater risk of skin cancer. Don't forget to also wear sunglasses in order to protect your eyes from too much damaging sunlight. Damaging sunlight can accelerate cataract formation.   Practice safe sex. Use condoms. Condoms are used for birth control and to help reduce the spread of sexually transmitted infections (or STIs).  Some of the STIs are gonorrhea (the clap), chlamydia, syphilis, trichomonas, herpes, HPV (human papilloma virus) and HIV (human immunodeficiency virus) which causes AIDS. The herpes, HIV and HPV are viral illnesses that have no cure. These can result in disability, cancer and death.   Keep carbon monoxide and smoke detectors in your home functioning at all times. Change the batteries every 6 months or use a model that plugs into the wall.   Vaccinations:  Stay up to date with your tetanus shots and other required immunizations. You should have a booster for tetanus every 10 years. Be sure to get your flu shot every year, since 5%-20% of the [place] population comes down with the flu. The flu vaccine changes each year, so being vaccinated once is not enough. Get your shot in the fall, before the flu season peaks.   Other vaccines to consider:  Human Papilloma Virus or HPV causes cancer of the cervix, and other infections that can be transmitted from person to person. There is a vaccine for HPV, and males should get immunized between the ages of 60 and 58. It requires a series of 3 shots.   Pneumococcal vaccine to protect against certain types of pneumonia.  This is normally recommended for adults age 66 or older.   However, adults younger than 51 years old with certain underlying conditions such as diabetes, heart or lung disease should also receive the vaccine.  Shingles vaccine to protect against Varicella Zoster if you are older than age 77, or younger than 51 years old with certain underlying illness.  If you have not had the Shingrix vaccine, please call your insurer to inquire about coverage for the Shingrix vaccine given in 2 doses.   Some insurers cover this vaccine after age 75, some cover this after age 79.  If your insurer covers this, then call  to schedule appointment to have this vaccine here  Hepatitis A vaccine to protect against a form of infection of the liver by a virus acquired from food.  Hepatitis B vaccine to protect against a form of infection of the liver by a virus acquired from blood or body fluids, particularly if you work in health care.  If you plan to travel internationally, check with your local health department for specific vaccination recommendations.   What should I know about cancer screening? Many types of cancers can be detected early and may often be prevented. Lung Cancer  You should be screened every year for lung cancer if: ? You are a current smoker who has smoked for at least 30 years. ? You are a former smoker who has quit within the past 15 years.  Talk to your health care provider about your screening options, when you should start screening, and how often you should be screened.  Colorectal Cancer  Routine colorectal cancer screening usually begins at 51 years of age and should be repeated every 5-10 years until you are 51 years old. You may need to be screened more often if early forms of precancerous polyps or small growths are found. Your health care provider may recommend screening at an earlier age if you have risk factors for colon cancer.  Your health care provider may recommend using home test kits to check for hidden blood in the stool.  A  small camera at the end of a tube can be used to examine your colon (sigmoidoscopy or colonoscopy). This checks for the earliest forms of colorectal cancer.  Prostate and Testicular Cancer  Depending on your age and overall health, your health care provider may do certain tests to screen for prostate and testicular cancer.  Talk to your health care provider about any symptoms or concerns you have about testicular or prostate cancer.  Skin Cancer  Check your skin from head to toe regularly.  Tell your health care provider about any new moles or changes in moles, especially if: ? There is a change in a mole's size, shape, or color. ? You have a mole that is larger than a pencil eraser.  Always use sunscreen. Apply sunscreen liberally and repeat throughout the day.  Protect yourself by wearing long sleeves, pants, a wide-brimmed hat, and sunglasses when outside.

## 2017-11-19 NOTE — Telephone Encounter (Signed)
Called, LVM advising patient that [facility] at [facility]:00 am. Patient advised to contact them at [phone] if the appointment time did not work, or to call our office if any questions from visit today.

## 2017-11-19 NOTE — Progress Notes (Signed)
Subjective:   HPI  Brian Velez is a 52 y.o. male who presents for Chief Complaint  Patient presents with  . Annual Exam    patient is fasting, no concerns    Medical care team includes: Tysinger, Kermit Balo, PA-C here for primary care Dentist Eye doctor  Concerns: Problems sleeping.   Has trouble getting and staying asleep.  Gets 4-5 hours per night.  This is a long term problem.  Had sleep study several years ago when he was 400lb.  But went off CPAP after losing weight.   Tried OTC sleep aids without improvement.  Reviewed their medical, surgical, family, social, medication, and allergy history and updated chart as appropriate.  Past Medical History:  Diagnosis Date  . History of gout   . Hypertension   . MI (myocardial infarction) (HCC) 2004  . Sleep apnea    "don't wear mask anymore"  . Stroke [facility]) 05/27/2014   "right side still weaker" (05/31/2014)    Past Surgical History:  Procedure Laterality Date  . ANKLE FRACTURE SURGERY Left 2000's  . CARDIAC CATHETERIZATION  2004; 2014  . COLONOSCOPY     [facility], [place] in the past  . HAND SURGERY     left palm and fingertip injury  . ROUX-EN-Y GASTRIC BYPASS  2013  . SKIN BIOPSY      Social History   Socioeconomic History  . Marital status: Married    Spouse name: Not on file  . Number of children: Not on file  . Years of education: Not on file  . Highest education level: Not on file  Occupational History  . Not on file  Social Needs  . Financial resource strain: Not on file  . Food insecurity:    Worry: Not on file    Inability: Not on file  . Transportation needs:    Medical: Not on file    Non-medical: Not on file  Tobacco Use  . Smoking status: Never Smoker  . Smokeless tobacco: Never Used  Substance and Sexual Activity  . Alcohol use: No  . Drug use: No  . Sexual activity: Never  Lifestyle  . Physical activity:    Days per week: Not on file    Minutes per session: Not on file  . Stress: Not  on file  Relationships  . Social connections:    Talks on phone: Not on file    Gets together: Not on file    Attends religious service: Not on file    Active member of club or organization: Not on file    Attends meetings of clubs or organizations: Not on file    Relationship status: Not on file  . Intimate partner violence:    Fear of current or ex partner: Not on file    Emotionally abused: Not on file    Physically abused: Not on file    Forced sexual activity: Not on file  Other Topics Concern  . Not on file  Social History Narrative   Brian Velez.   Divorced.   4 children, 1 grandchildren.   Exercise - moderate, goes to the gym   10/2017.    Family History  Problem Relation Age of Onset  . Dementia Mother        died of dementia  . Breast cancer Mother   . Hypertension Mother   . Heart disease Mother   . Cancer Mother        breast  . Diabetes Mother   .  CAD Father   . Stroke Father   . Prostate cancer Father 76  . Cancer Father 34       colon  . Hypertension Father   . Diabetes Father   . Heart disease Father   . Diabetes type II Sister   . Hypertension Sister   . Diabetes Sister   . Diabetes type II Brother   . Hypertension Brother   . Diabetes Brother   . Diabetes type II Sister   . Hypertension Sister   . Diabetes Sister   . Diabetes type II Sister   . Hypertension Sister   . Stroke Brother      Current Outpatient Medications:  .  olmesartan (BENICAR) 20 MG tablet, Take 1 tablet (20 mg total) by mouth daily., Disp: 30 tablet, Rfl: 2 .  pravastatin (PRAVACHOL) 20 MG tablet, Take 1 tablet (20 mg total) by mouth every evening., Disp: 30 tablet, Rfl: 2 .  hydrochlorothiazide (HYDRODIURIL) 25 MG tablet, Take 1 tablet (25 mg total) by mouth daily. (Patient not taking: Reported on 11/19/2017), Disp: 30 tablet, Rfl: 0 .  triamcinolone cream (KENALOG) 0.1 %, Apply 1 application topically 2 (two) times daily., Disp: 30 g, Rfl: 0  No Known Allergies   Review of  Systems Constitutional: -fever, -chills, -sweats, -unexpected weight change, -decreased appetite, -fatigue Allergy: -sneezing, -itching, -congestion Dermatology: -changing moles, --rash, -lumps ENT: -runny nose, -ear pain, -sore throat, -hoarseness, -sinus pain, -teeth pain, - ringing in ears, -hearing loss, -nosebleeds Cardiology: -chest pain, -palpitations, -swelling, -difficulty breathing when lying flat, -waking up short of breath Respiratory: -cough, -shortness of breath, -difficulty breathing with exercise or exertion, -wheezing, -coughing up blood Gastroenterology: -abdominal pain, -nausea, -vomiting, -diarrhea, -constipation, -blood in stool, -changes in bowel movement, -difficulty swallowing or eating Hematology: -bleeding, -bruising  Musculoskeletal: -joint aches, -muscle aches, -joint swelling, -back pain, -neck pain, -cramping, -changes in gait Ophthalmology: denies vision changes, eye redness, itching, discharge Urology: -burning with urination, -difficulty urinating, -blood in urine, -urinary frequency, -urgency, -incontinence Neurology: -headache, -weakness, -tingling, -numbness, -memory loss, -falls, -dizziness Psychology: -depressed mood, -agitation, +sleep problems Male GU: no testicular mass, pain, no lymph nodes swollen, no swelling, no rash.     Objective:  BP 134/90 (BP Location: Right Arm, Patient Position: Sitting, Cuff Size: Normal)   Pulse 60   Ht 5' 8.25" (1.734 m)   Wt 199 lb 6.4 oz (90.4 kg)   SpO2 98%   BMI 30.10 kg/m   General appearance: alert, no distress, WD/WN, African American male Skin: freckles throughout, right upper chest with raised 4mm diameter bluish purple lesion with ulcerated scabbed central depression HEENT: normocephalic, conjunctiva/corneas normal, sclerae anicteric, PERRLA, EOMi, nares patent, no discharge or erythema, pharynx normal Oral cavity: MMM, tongue normal, teeth in good repair Neck: supple, no lymphadenopathy, no thyromegaly,  no masses, normal ROM, no bruits Chest: non tender, normal shape and expansion Heart: RRR, normal S1, S2, no murmurs Lungs: CTA bilaterally, no wheezes, rhonchi, or rales Abdomen: +bs, soft, non tender, non distended, no masses, no hepatomegaly, no splenomegaly, no bruits Back: non tender, normal ROM, no scoliosis Musculoskeletal: upper extremities non tender, no obvious deformity, normal ROM throughout, lower extremities non tender, no obvious deformity, normal ROM throughout Extremities: no edema, no cyanosis, no clubbing Pulses: 2+ symmetric, upper and lower extremities, normal cap refill Neurological: alert, oriented x 3, CN2-12 intact, strength normal upper extremities and lower extremities, sensation normal throughout, DTRs 2+ throughout, no cerebellar signs, gait normal Psychiatric: normal affect, behavior normal, pleasant  GU: normal male external genitalia,circumcised, nontender, no masses, no hernia, no lymphadenopathy Rectal: hemorrhoidal tissue present, prostate moderately enlarged, no nodules  Reviewed 12/2016 EKG in chart Reviewed 05/2014 echo, LV normal function, mild concentric hypertrophy, EF 55-60%, no wall motion abnormality Reviewed CT head and chest from 12/2016 ED visit  Assessment and Plan :   Encounter Diagnoses  Name Primary?  . Encounter for health maintenance examination in adult Yes  . Screening for prostate cancer   . Screen for colon cancer   . Family history of colon cancer   . Essential hypertension   . Right hemiplegia (HCC)   . Impingement of ulnar nerve, unspecified laterality   . Family history of diabetes mellitus   . Family history of hypertension   . History of myocardial infarction   . History of stroke   . Heart disease   . History of gout   . Encounter for hepatitis C screening test for low risk patient   . Need for Tdap vaccination   . Vaccine counseling   . Insomnia, unspecified type     Physical exam - discussed and counseled on  healthy lifestyle, diet, exercise, preventative care, vaccinations, sick and well care, proper use of emergency dept and after hours care, and addressed their concerns.    Health screening: See your eye doctor yearly for routine vision care. See your dentist yearly for routine dental care including hygiene visits twice yearly.  Cancer screening Colonoscopy:  Has consult pending 12/2017  Discussed PSA, prostate exam, and prostate cancer screening risks/benefits.     Vaccinations: Advised yearly influenza vaccine  Counseled on Prevnar 13/Pneumococcal vaccine at age 52 years old  Counseled on Pneumococcal 23 vaccine.  He will check insurance coverage.  Counseled on Shingles vaccine at age 51 years and older Patient will check insurance coverage for this and consider vaccination  Counseled on the Tdap (tetanus, diptheria, and acellular pertussis) vaccine.  Vaccine information sheet given. Tdap vaccine given after consent obtained.   Separate significant chronic issues discussed: C/t current medications Referral for echocardiogram Insomnia - counseled on sleep hygiene, begin trial of Zolpidem Skin lesion - short term trial of TAC cream and if not resolving within 2 wk, send to derm or return for biopsy Hx/o MI - discussed secondary prevention   Sharma Covertorman was seen today for annual exam.  Diagnoses and all orders for this visit:  Encounter for health maintenance examination in adult -     Comprehensive metabolic panel -     CBC -     Lipid panel -     PSA -     Hemoglobin A1c -     Microalbumin / creatinine urine ratio -     Hepatitis C antibody -     HIV antibody -     ECHOCARDIOGRAM COMPLETE; Future  Screening for prostate cancer -     PSA  Screen for colon cancer  Family history of colon cancer  Essential hypertension -     Comprehensive metabolic panel -     Microalbumin / creatinine urine ratio -     ECHOCARDIOGRAM COMPLETE; Future  Right hemiplegia  (HCC)  Impingement of ulnar nerve, unspecified laterality  Family history of diabetes mellitus  Family history of hypertension  History of myocardial infarction  History of stroke  Heart disease -     ECHOCARDIOGRAM COMPLETE; Future  History of gout  Encounter for hepatitis C screening test for low risk patient -     Hepatitis  C antibody  Need for Tdap vaccination -     Tdap vaccine greater than or equal to 7yo IM  Vaccine counseling  Insomnia, unspecified type  Other orders -     triamcinolone cream (KENALOG) 0.1 %; Apply 1 application topically 2 (two) times daily.   Follow-up pending labs, yearly for physical

## 2017-11-19 NOTE — Progress Notes (Signed)
tdap

## 2017-11-20 LAB — LIPID PANEL
CHOLESTEROL TOTAL: 146 mg/dL (ref 100–199)
Chol/HDL Ratio: 1.8 ratio (ref 0.0–5.0)
HDL: 79 mg/dL (ref >39)
LDL Calculated: 59 mg/dL (ref 0–99)
Triglycerides: 40 mg/dL (ref 0–149)
VLDL Cholesterol Cal: 8 mg/dL (ref 5–40)

## 2017-11-20 LAB — COMPREHENSIVE METABOLIC PANEL
A/G RATIO: 1.8 (ref 1.2–2.2)
ALT: 13 IU/L (ref 0–44)
AST: 21 IU/L (ref 0–40)
Albumin: 4.3 g/dL (ref 3.5–5.5)
Alkaline Phosphatase: 84 IU/L (ref 39–117)
BILIRUBIN TOTAL: 1.2 mg/dL (ref 0.0–1.2)
BUN/Creatinine Ratio: 10 (ref 9–20)
BUN: 11 mg/dL (ref 6–24)
CO2: 26 mmol/L (ref 20–29)
Calcium: 8.9 mg/dL (ref 8.7–10.2)
Chloride: 103 mmol/L (ref 96–106)
Creatinine, Ser: 1.1 mg/dL (ref 0.76–1.27)
GFR calc Af Amer: 89 mL/min/{1.73_m2} (ref >59)
GFR calc non Af Amer: 77 mL/min/{1.73_m2} (ref >59)
Globulin, Total: 2.4 g/dL (ref 1.5–4.5)
Glucose: 98 mg/dL (ref 65–99)
POTASSIUM: 4.4 mmol/L (ref 3.5–5.2)
Sodium: 142 mmol/L (ref 134–144)
Total Protein: 6.7 g/dL (ref 6.0–8.5)

## 2017-11-20 LAB — CBC
HEMOGLOBIN: 14.2 g/dL (ref 13.0–17.7)
Hematocrit: 41.2 % (ref 37.5–51.0)
MCH: 28.6 pg (ref 26.6–33.0)
MCHC: 34.5 g/dL (ref 31.5–35.7)
MCV: 83 fL (ref 79–97)
Platelets: 264 10*3/uL (ref 150–379)
RBC: 4.96 x10E6/uL (ref 4.14–5.80)
RDW: 14 % (ref 12.3–15.4)
WBC: 6.3 10*3/uL (ref 3.4–10.8)

## 2017-11-20 LAB — MICROALBUMIN / CREATININE URINE RATIO
Creatinine, Urine: 238.7 mg/dL
Microalb/Creat Ratio: 3.5 mg/g creat (ref 0.0–30.0)
Microalbumin, Urine: 8.4 ug/mL

## 2017-11-20 LAB — HIV ANTIBODY (ROUTINE TESTING W REFLEX): HIV Screen 4th Generation wRfx: NONREACTIVE

## 2017-11-20 LAB — HEMOGLOBIN A1C
ESTIMATED AVERAGE GLUCOSE: 123 mg/dL
Hgb A1c MFr Bld: 5.9 % — ABNORMAL HIGH (ref 4.8–5.6)

## 2017-11-20 LAB — PSA: Prostate Specific Ag, Serum: 1.9 ng/mL (ref 0.0–4.0)

## 2017-11-20 LAB — HEPATITIS C ANTIBODY: Hep C Virus Ab: <0.1 s/co ratio (ref 0.0–0.9)

## 2017-11-21 ENCOUNTER — Other Ambulatory Visit: Payer: Self-pay | Admitting: Medical

## 2017-11-21 MED ORDER — HYDROCHLOROTHIAZIDE 12.5 MG PO TABS: 12.5000 mg | ORAL_TABLET | Freq: Every day | ORAL | 3 refills | Status: DC

## 2017-11-21 MED ORDER — PRAVASTATIN SODIUM 20 MG PO TABS: 20.0000 mg | ORAL_TABLET | Freq: Every evening | ORAL | 3 refills | Status: DC

## 2017-11-21 MED ORDER — OLMESARTAN MEDOXOMIL 20 MG PO TABS: 20.0000 mg | ORAL_TABLET | Freq: Every day | ORAL | 3 refills | Status: DC

## 2017-11-22 ENCOUNTER — Encounter: Payer: Self-pay | Admitting: Medical

## 2017-11-23 ENCOUNTER — Encounter: Payer: Self-pay | Admitting: Medical

## 2017-11-25 ENCOUNTER — Ambulatory Visit (HOSPITAL_COMMUNITY)
Admission: RE | Admit: 2017-11-25 | Discharge: 2017-11-25 | Disposition: A | Discharge status: Home / Self Care | Payer: 59 | Source: Ambulatory Visit | Attending: Medical | Admitting: Medical

## 2017-11-25 DIAGNOSIS — I119 Hypertensive heart disease without heart failure: Secondary | ICD-10-CM | POA: Diagnosis not present

## 2017-11-25 DIAGNOSIS — R9431 Abnormal electrocardiogram [ECG] [EKG]: Secondary | ICD-10-CM | POA: Diagnosis present

## 2017-11-25 DIAGNOSIS — I519 Heart disease, unspecified: Secondary | ICD-10-CM

## 2017-11-25 DIAGNOSIS — I10 Essential (primary) hypertension: Secondary | ICD-10-CM

## 2017-11-25 DIAGNOSIS — I252 Old myocardial infarction: Secondary | ICD-10-CM | POA: Diagnosis not present

## 2017-11-25 DIAGNOSIS — Z0000 Encounter for general adult medical examination without abnormal findings: Secondary | ICD-10-CM | POA: Diagnosis not present

## 2017-11-25 DIAGNOSIS — Z8673 Personal history of transient ischemic attack (TIA), and cerebral infarction without residual deficits: Secondary | ICD-10-CM | POA: Insufficient documentation

## 2017-11-25 NOTE — Progress Notes (Signed)
  Echocardiogram 2D Echocardiogram has been performed.  Kolby Myung T Nixie Laube 11/25/2017, 8:32 AM

## 2017-12-07 NOTE — Progress Notes (Signed)
lmom asking patient to call back for ultrasound results and to schedule a 3 month follow up

## 2017-12-08 NOTE — Progress Notes (Signed)
lmom asking patient to call the office for provider note and to schedule his follow up appointment.

## 2017-12-09 ENCOUNTER — Ambulatory Visit (AMBULATORY_SURGERY_CENTER): Payer: Self-pay | Admitting: *Deleted

## 2017-12-09 ENCOUNTER — Other Ambulatory Visit: Payer: Self-pay

## 2017-12-09 VITALS — Ht 69.0 in | Wt 195.0 lb

## 2017-12-09 DIAGNOSIS — Z800 Family history of malignant neoplasm of digestive organs: Secondary | ICD-10-CM

## 2017-12-09 MED ORDER — PEG-KCL-NACL-NASULF-NA ASC-C 140 G PO SOLR: 1.0000 | ORAL | 0 refills | Status: DC

## 2017-12-09 NOTE — Progress Notes (Signed)
No egg or soy allergy known to patient  No issues with past sedation with any surgeries  or procedures, no intubation problems  No diet pills per patient No home 02 use per patient  No blood thinners per patient  Pt denies issues with constipation  No A fib or A flutter  EMMI video sent to pt's e mail  [employer] coupon to pt in PV today - explained to pt we do not do PA's and why in case pharmacy calls and says PA required

## 2017-12-10 ENCOUNTER — Encounter: Payer: Self-pay | Admitting: Medical

## 2017-12-10 ENCOUNTER — Ambulatory Visit (INDEPENDENT_AMBULATORY_CARE_PROVIDER_SITE_OTHER): Payer: 59 | Admitting: Medical

## 2017-12-10 ENCOUNTER — Encounter: Payer: Self-pay | Admitting: Gastroenterology

## 2017-12-10 VITALS — BP 144/78 | HR 58 | Temp 98.1°F | Ht 68.0 in | Wt 195.6 lb

## 2017-12-10 DIAGNOSIS — I519 Heart disease, unspecified: Secondary | ICD-10-CM

## 2017-12-10 DIAGNOSIS — G4700 Insomnia, unspecified: Secondary | ICD-10-CM

## 2017-12-10 DIAGNOSIS — M542 Cervicalgia: Secondary | ICD-10-CM | POA: Insufficient documentation

## 2017-12-10 DIAGNOSIS — I252 Old myocardial infarction: Secondary | ICD-10-CM | POA: Diagnosis not present

## 2017-12-10 DIAGNOSIS — R0602 Shortness of breath: Secondary | ICD-10-CM | POA: Insufficient documentation

## 2017-12-10 DIAGNOSIS — M792 Neuralgia and neuritis, unspecified: Secondary | ICD-10-CM | POA: Insufficient documentation

## 2017-12-10 DIAGNOSIS — E785 Hyperlipidemia, unspecified: Secondary | ICD-10-CM

## 2017-12-10 DIAGNOSIS — I10 Essential (primary) hypertension: Secondary | ICD-10-CM | POA: Diagnosis not present

## 2017-12-10 DIAGNOSIS — R0609 Other forms of dyspnea: Secondary | ICD-10-CM | POA: Insufficient documentation

## 2017-12-10 MED ORDER — ZOLPIDEM TARTRATE 10 MG PO TABS: 10.0000 mg | ORAL_TABLET | Freq: Every evening | ORAL | 0 refills | Status: DC | PRN

## 2017-12-10 MED ORDER — CARVEDILOL 3.125 MG PO TABS: 3.1250 mg | ORAL_TABLET | Freq: Two times a day (BID) | ORAL | 2 refills | Status: DC

## 2017-12-10 MED ORDER — HYDROCODONE-ACETAMINOPHEN 5-325 MG PO TABS: 1.0000 | ORAL_TABLET | Freq: Four times a day (QID) | ORAL | 0 refills | Status: DC | PRN

## 2017-12-10 NOTE — Progress Notes (Signed)
Subjective:  Brian Velez is a 52 y.o. male who presents for Chief Complaint  Patient presents with  . Blood Pressure Check    was 184/146 last Sunday around 1:45pm     Here for recent elevated BP.  He is compliant with Benicar , HCTZ 12.5mg .  We added HCTZ a month ago.  In general home readings been running 140/90s, but was sweaty at church this past week at church, nurse checked his BP and it was reportedly 184/146.  Sat for an hour and rested, ended up taking another BP pill.   Had sleep study before gastric bypass.  Was on CPAP prior to losing weight.   He just had the echocardiogram recently I ordered.  He does endorse some low grade shortness of breath.  No chest pain, no edema.    Not resting well.   Would like an increase in dose of sleep aid.   Last night went bed 11:15pm.   Took Ambien  fell asleep, but awoke at 1:15am, couldn't go back to sleep.  Has busy schedule currently.  Has 1 week hx/o pain and tingling down right arm, entire arm.   Mild neck pain.  No recent injury or trauma.  No other aggravating or relieving factors.    No other c/o.  The following portions of the patient's history were reviewed and updated as appropriate: allergies, current medications, past family history, past medical history, past social history, past surgical history and problem list.  ROS Otherwise as in subjective above  Objective: BP (!) 144/78   Pulse (!) 58   Temp 98.1 F (36.7 C) (Oral)   Ht  (1.727 m)   Wt 195 lb 9.6 oz (88.7 kg)   SpO2 97%   BMI 29.74 kg/m   BP Readings from Last 3 Encounters:  12/10/17 (!) 144/78  11/19/17 134/90  10/19/17 (!) 148/90   General appearance: alert, no distress, well developed, well nourished Neck: supple, no lymphadenopathy, no thyromegaly, no masses, no bruits Heart: RRR, normal S1, S2, no murmurs Lungs: CTA bilaterally, no wheezes, rhonchi, or rales Abdomen: +bs, soft, non tender, non distended, no masses, no hepatomegaly, no  splenomegaly Pulses: 2+ radial pulses, 2+ pedal pulses, normal cap refill Ext: no edema Neck: nontender, normal ROM Right arm nontender, normal ROM Arms normal strength and sensation   Adult ECG Report  Indication: HTN  Rate: 54 bpm  Rhythm: sinus bradycardia  QRS Axis: 70 degrees  PR Interval:  QRS Duration:  QTc:  Conduction Disturbances: T wave inversion V2, nonspecific ST abnormality V2, RSR' in V3, RBBB  Other Abnormalities: none  Patient's cardiac risk factors are: dyslipidemia, hypertension, male gender and known heart disease.  EKG comparison: 12/2016  Narrative Interpretation: RBBB, no new changes compared to 12/2016 EKG    Assessment: Encounter Diagnoses  Name Primary?  . Essential hypertension Yes  . Heart disease   . History of myocardial infarction   . Hyperlipidemia, unspecified hyperlipidemia type   . Neck pain   . Radicular pain in right arm   . Insomnia, unspecified type   . Shortness of breath      Plan: HTN, hx/o MI, SOB - I don't like his recent high reading and he hasn't been at goal.    He is a relatively new patient to me.   I reviewed the echocardiogram I ordered from 11/25/17.   We discussed results.   Add coreg today and refer to cardiology for eval hopefully next  week.   Advised to go to the ED if any worse symptoms or repeat dangerously high BP reading.    hyperlipemia - c/t statin recently started.   Neck pain, radicular pain - go for neck xray.   Advised stretching.  Can use short term Norco prn.   STOP NSAIDs as he has been taking aleve for this.  Insomnia - can change to Ambien  short term, discussed sleep hygiene.    Brian Velez was seen today for blood pressure check.  Diagnoses and all orders for this visit:  Essential hypertension -     Ambulatory referral to Cardiology -     EKG 12-Lead  Heart disease -     Ambulatory referral to Cardiology -     EKG 12-Lead  History of myocardial infarction -     Ambulatory  referral to Cardiology  Hyperlipidemia, unspecified hyperlipidemia type -     Ambulatory referral to Cardiology  Neck pain -     DG Cervical Spine Complete; Future  Radicular pain in right arm -     DG Cervical Spine Complete; Future  Insomnia, unspecified type  Shortness of breath -     Ambulatory referral to Cardiology  Other orders -     carvedilol (COREG) 3.125 MG tablet; Take 1 tablet (3.125 mg total) by mouth 2 (two) times daily with a meal. -     HYDROcodone-acetaminophen (NORCO) 5-325 MG tablet; Take 1 tablet by mouth every 6 (six) hours as needed for moderate pain. -     zolpidem (AMBIEN) 10 MG tablet; Take 1 tablet (10 mg total) by mouth at bedtime as needed for sleep.    Follow up: pending cardiology consult, xray.

## 2017-12-13 ENCOUNTER — Telehealth: Payer: Self-pay

## 2017-12-13 ENCOUNTER — Other Ambulatory Visit: Payer: Self-pay | Admitting: Medical

## 2017-12-13 MED ORDER — METOPROLOL TARTRATE 25 MG PO TABS: 25.0000 mg | ORAL_TABLET | Freq: Two times a day (BID) | ORAL | 1 refills | Status: DC

## 2017-12-13 NOTE — Telephone Encounter (Signed)
Patient needs all RX from Friday sent to [facility] [address]  So his insurance will cover the cost.

## 2017-12-14 ENCOUNTER — Other Ambulatory Visit: Payer: Self-pay | Admitting: Medical

## 2017-12-14 NOTE — Telephone Encounter (Signed)
Please [facility]

## 2017-12-14 NOTE — Telephone Encounter (Signed)
Patient called He needs Remus Loffler and [employer] rx sent to [facility] [address] Due to insurance, he can not use [facility] They transferred other rx's  But can not transfer these 2

## 2017-12-15 ENCOUNTER — Other Ambulatory Visit: Payer: Self-pay | Admitting: Medical

## 2017-12-15 ENCOUNTER — Other Ambulatory Visit: Payer: Self-pay

## 2017-12-15 ENCOUNTER — Ambulatory Visit (INDEPENDENT_AMBULATORY_CARE_PROVIDER_SITE_OTHER): Payer: 59 | Admitting: Cardiology

## 2017-12-15 ENCOUNTER — Ambulatory Visit: Payer: 59 | Admitting: Cardiology

## 2017-12-15 VITALS — BP 132/70 | HR 53 | Ht 69.0 in | Wt 198.4 lb

## 2017-12-15 DIAGNOSIS — G4700 Insomnia, unspecified: Secondary | ICD-10-CM | POA: Diagnosis not present

## 2017-12-15 DIAGNOSIS — Z9189 Other specified personal risk factors, not elsewhere classified: Secondary | ICD-10-CM | POA: Diagnosis not present

## 2017-12-15 DIAGNOSIS — Z8673 Personal history of transient ischemic attack (TIA), and cerebral infarction without residual deficits: Secondary | ICD-10-CM

## 2017-12-15 DIAGNOSIS — I2510 Atherosclerotic heart disease of native coronary artery without angina pectoris: Secondary | ICD-10-CM | POA: Insufficient documentation

## 2017-12-15 DIAGNOSIS — I10 Essential (primary) hypertension: Secondary | ICD-10-CM | POA: Diagnosis not present

## 2017-12-15 DIAGNOSIS — I25119 Atherosclerotic heart disease of native coronary artery with unspecified angina pectoris: Secondary | ICD-10-CM | POA: Insufficient documentation

## 2017-12-15 DIAGNOSIS — E785 Hyperlipidemia, unspecified: Secondary | ICD-10-CM | POA: Diagnosis not present

## 2017-12-15 MED ORDER — METOPROLOL TARTRATE 25 MG PO TABS: 25.0000 mg | ORAL_TABLET | Freq: Two times a day (BID) | ORAL | 1 refills | Status: DC

## 2017-12-15 MED ORDER — ZOLPIDEM TARTRATE 10 MG PO TABS: 10.0000 mg | ORAL_TABLET | Freq: Every evening | ORAL | 0 refills | Status: DC | PRN

## 2017-12-15 MED ORDER — HYDROCODONE-ACETAMINOPHEN 5-325 MG PO TABS: 1.0000 | ORAL_TABLET | Freq: Four times a day (QID) | ORAL | 0 refills | Status: DC | PRN

## 2017-12-15 NOTE — Patient Instructions (Signed)
Medication Instructions:  Your physician recommends that you continue on your current medications as directed. Please refer to the Current Medication list given to you today.   Labwork: None  Testing/Procedures: Your physician has requested that you have a carotid ultrasound. This test is an ultrasound of the carotid arteries in your neck. It looks at blood flow through these arteries that supply the brain with blood. Allow one hour for this exam. There are no restrictions or special instructions.  You have been referred for a sleep study. You will be contacted to schedule this appointment.   Follow-Up: Your physician recommends that you schedule a follow-up appointment in: 3 weeks.    Any Other Special Instructions Will Be Listed Below (If Applicable).  Please purchase a blood pressure monitor and take your blood pressure daily. Keep record of each reading and bring this log to your next appointment.   If you need a refill on your cardiac medications before your next appointment, please call your pharmacy.

## 2017-12-15 NOTE — Progress Notes (Signed)
Cardiology Consultation:    Date:  12/15/2017   ID:  Brian Velez, DOB 1965-11-19, MRN [id number]  PCP:  Carlena Hurl, PA-C  Cardiologist:  Jenne Campus, MD   Referring MD: Carlena Hurl, PA-C   Chief Complaint  Patient presents with  . Hypertension  . Heart Problem  . History of MI  I need to be established with cardiologist  History of Present Illness:    Brian Velez is a 52 y.o. male who is being seen today for the evaluation of hypertension, history of CAD, history of CVA at the request of Carlena Hurl, PA-C.  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  His past medical history is also significant for coronary artery disease apparently a few years ago he had myocardial infarction.  He went to [facility] he was told to have microinfarction however no cardiac catheterization has been done.  He apparently had a stress test which was fine.  Also history of CVA he was told he got stroke because of hypertension.  Did have a history of obstructive sleep apnea, however, after he lost significant amount of weight he was told there is no problem anymore he does not use mask anymore.  The problem is the main reason that he is in my office is the fact that he have difficulty controlling his blood pressure. For last few months blood pressure can go high in the matter-of-fact he has noticed that he she check his blood pressure on the regular basis and sometimes blood pressure will be more than 150/100.  There is some recent adjustment of his medication today in my office 130/70 He goes to gym on the regular basis he goes on the treadmill he walks on the treadmill he said he does 1 mile over the 45 minutes.  The reason why he goes to gym is the fact that he does have some membership that he cannot get rid of and he is going to go there until July 8 when he  will finish his membership.  Past Medical History:  Diagnosis Date  . History of gout   . Hypertension   . MI (myocardial infarction) ([place]) 2004  . Sleep apnea    "don't wear mask anymore"  . Stroke [facility]) 05/27/2014   "right side still weaker" (05/31/2014)    Past Surgical History:  Procedure Laterality Date  . ANKLE FRACTURE SURGERY Left 2000's  . CARDIAC CATHETERIZATION  2004; 2014  . HAND SURGERY     left palm and fingertip injury  . ROUX-EN-Y GASTRIC BYPASS  2013  . SKIN BIOPSY      Current Medications: Current Meds  Medication Sig  . hydrochlorothiazide (HYDRODIURIL) 12.5 MG tablet Take 1 tablet (12.5 mg total) by mouth daily.  Marland Kitchen HYDROcodone-acetaminophen (NORCO) 5-325 MG tablet Take 1 tablet by mouth every 6 (six) hours as needed for moderate pain.  . metoprolol tartrate (LOPRESSOR) 25 MG tablet Take 1 tablet (25 mg total) by mouth 2 (two) times daily.  Marland Kitchen olmesartan (BENICAR) 20 MG tablet Take 1 tablet (20 mg total) by mouth daily.  Marland Kitchen PEG-KCl-NaCl-NaSulf-Na Asc-C (PLENVU) 140 g SOLR Take 1 kit by mouth as directed.  . pravastatin (PRAVACHOL) 20 MG tablet Take 1 tablet (20 mg total) by mouth every evening.  . triamcinolone cream (KENALOG) 0.1 % Apply 1 application topically 2 (  two) times daily.  Marland Kitchen zolpidem (AMBIEN) 10 MG tablet Take 1 tablet (10 mg total) by mouth at bedtime as needed for sleep.     Allergies:   Patient has no known allergies.   Social History   Socioeconomic History  . Marital status: Married    Spouse name: Not on file  . Number of children: Not on file  . Years of education: Not on file  . Highest education level: Not on file  Occupational History  . Not on file  Social Needs  . Financial resource strain: Not on file  . Food insecurity:    Worry: Not on file    Inability: Not on file  . Transportation needs:    Medical: Not on file    Non-medical: Not on file  Tobacco Use  . Smoking status: Never Smoker  . Smokeless tobacco: Never  Used  Substance and Sexual Activity  . Alcohol use: No  . Drug use: No  . Sexual activity: Never  Lifestyle  . Physical activity:    Days per week: Not on file    Minutes per session: Not on file  . Stress: Not on file  Relationships  . Social connections:    Talks on phone: Not on file    Gets together: Not on file    Attends religious service: Not on file    Active member of club or organization: Not on file    Attends meetings of clubs or organizations: Not on file    Relationship status: Not on file  Other Topics Concern  . Not on file  Social History Narrative   Doristine Bosworth.   Divorced.   4 children, 1 grandchildren.   Exercise - moderate, goes to the gym   10/2017.     Family History: The patient's family history includes Breast cancer in his mother; CAD in his father; Cancer in his mother; Cancer (age of onset: 51) in his father; Colon cancer in his brother, father, paternal uncle, and paternal uncle; Colon polyps in his brother; Dementia in his mother; Diabetes in his brother, father, mother, sister, and sister; Diabetes type II in his brother, sister, sister, and sister; Heart disease in his father and mother; Hypertension in his brother, father, mother, sister, sister, and sister; Prostate cancer (age of onset: 35) in his father; Stroke in his brother and father. There is no history of Esophageal cancer, Rectal cancer, or Stomach cancer. ROS:   Please see the history of present illness.    All 14 point review of systems negative except as described per history of present illness.  EKGs/Labs/Other Studies Reviewed:    The following studies were reviewed today: EKG showed normal sinus rhythm normal P interval right bundle branch block  Recent Labs: 11/19/2017: ALT 13; BUN 11; Creatinine, Ser 1.10; Hemoglobin 14.2; Platelets 264; Potassium 4.4; Sodium 142  Recent Lipid Panel    Component Value Date/Time   CHOL 146 11/19/2017 1016   TRIG 40 11/19/2017 1016   HDL 79 11/19/2017  1016   CHOLHDL 1.8 11/19/2017 1016   CHOLHDL 1.7 06/01/2014 0435   VLDL 9 06/01/2014 0435   LDLCALC 59 11/19/2017 1016    Physical Exam:    VS:  BP 132/70 (BP Location: Right Arm)   Pulse (!) 53   Ht _0  (1.753 m)   Wt 198 lb 6.4 oz (90 kg)   SpO2 98%   BMI 29.30 kg/m     Wt Readings from Last 3 Encounters:  12/15/17 198 lb 6.4 oz (90 kg)  12/10/17 195 lb 9.6 oz (88.7 kg)  12/09/17 195 lb (88.5 kg)     GEN:  Well nourished, well developed in no acute distress HEENT: Normal NECK: No JVD; No carotid bruits LYMPHATICS: No lymphadenopathy CARDIAC: RRR, no murmurs, no rubs, no gallops RESPIRATORY:  Clear to auscultation without rales, wheezing or rhonchi  ABDOMEN: Soft, non-tender, non-distended MUSCULOSKELETAL:  No edema; No deformity  SKIN: Warm and dry NEUROLOGIC:  Alert and oriented x 3 PSYCHIATRIC:  Normal affect   ASSESSMENT:    1. Essential hypertension   2. Insomnia, unspecified type   3. Hyperlipidemia, unspecified hyperlipidemia type   4. History of stroke   5. Coronary artery disease involving native coronary artery of native heart without angina pectoris    PLAN:    In order of problems listed above:  1. Essential hypertension: Blood pressure 130/70 today.  I asked him to purchase blood pressure monitor check his blood pressure on the regular basis.  I want him to bring blood pressure measurements to me next time when he will be here.  Also told him to call us if his blood pressure persistently more 140/90.  He does have history of morbid obesity however lost more than 200 pounds since that time blood pressure seems to be better another potential explanation for his blood pressure that getting difficult to control is the fact that he did have sleep apnea.  Apparently after he lost all weight he was told that he does not need to use any mask but still I think it would be reasonable to go with sleep study to make sure he still does not have significant sleep  apnea 2. Dyslipidemia: Ideally he need to be on high intensity statin if he truly have myocardial infarction and CVA high intensity statin will be recommended a look at his cholesterol which looks quite decent he was given Pravachol he just started taking we will continue with this medication then we will recheck his fasting lipid profile 3. History of stroke.  He does have history of carotid arterial disease up to 30% narrowing we need to schedule him to have carotid ultrasound. 4. I did review his echocardiogram which showed enlargement of the right ventricle there is no mentioning about pulmonary pressure however there is another argument why a sleep study need to be done to make sure he does not have significant sleep apnea which can contribute to pulmonary hypertension.   Medication Adjustments/Labs and Tests Ordered: Current medicines are reviewed at length with the patient today.  Concerns regarding medicines are outlined above.  No orders of the defined types were placed in this encounter.  No orders of the defined types were placed in this encounter.   Signed, Park Liter, MD, [facility]. 12/15/2017 3:16 PM    [place]

## 2017-12-16 NOTE — Telephone Encounter (Signed)
Done already

## 2017-12-17 ENCOUNTER — Ambulatory Visit (AMBULATORY_SURGERY_CENTER): Payer: 59 | Admitting: Gastroenterology

## 2017-12-17 ENCOUNTER — Encounter: Payer: Self-pay | Admitting: Gastroenterology

## 2017-12-17 ENCOUNTER — Other Ambulatory Visit: Payer: Self-pay

## 2017-12-17 VITALS — BP 152/89 | HR 48 | Temp 98.0°F | Resp 19 | Ht 69.0 in | Wt 195.0 lb

## 2017-12-17 DIAGNOSIS — Z800 Family history of malignant neoplasm of digestive organs: Secondary | ICD-10-CM | POA: Diagnosis not present

## 2017-12-17 DIAGNOSIS — Z1211 Encounter for screening for malignant neoplasm of colon: Secondary | ICD-10-CM | POA: Diagnosis present

## 2017-12-17 MED ORDER — SODIUM CHLORIDE 0.9 % IV SOLN: 500.0000 mL | Freq: Once | INTRAVENOUS | Status: DC

## 2017-12-17 NOTE — Patient Instructions (Signed)

## 2017-12-17 NOTE — Progress Notes (Signed)
To recovery, report to RN, VSS. 

## 2017-12-17 NOTE — Op Note (Signed)
[facility] Patient Name: Brian Velez Procedure Date: 12/17/2017 11:39 AM MRN: [id number] Endoscopist: Sherilyn Cooter L. Myrtie Neither , MD Age: 52 Referring MD:  Date of Birth: [birth year] Gender: Male Account #: [id number] Procedure:                Colonoscopy Indications:              Screening in patient at increased risk: Colorectal                            cancer in father 84 or older Medicines:                Monitored Anesthesia Care Procedure:                Pre-Anesthesia Assessment:                           - Prior to the procedure, a History and Physical                            was performed, and patient medications and                            allergies were reviewed. The patient's tolerance of                            previous anesthesia was also reviewed. The risks                            and benefits of the procedure and the sedation                            options and risks were discussed with the patient.                            All questions were answered, and informed consent                            was obtained. Prior Anticoagulants: The patient has                            taken no previous anticoagulant or antiplatelet                            agents. ASA Grade Assessment: II - A patient with                            mild systemic disease. After reviewing the risks                            and benefits, the patient was deemed in                            satisfactory condition to undergo the procedure.  After obtaining informed consent, the colonoscope                            was passed under direct vision. Throughout the                            procedure, the patient's blood pressure, pulse, and                            oxygen saturations were monitored continuously. The                            Colonoscope was introduced through the anus and                            advanced to the the cecum,  identified by                            appendiceal orifice and ileocecal valve. The                            colonoscopy was performed without difficulty. The                            patient tolerated the procedure well. The quality                            of the bowel preparation was good. The ileocecal                            valve, appendiceal orifice, and rectum were                            photographed. The quality of the bowel preparation                            was evaluated using the BBPS [facility] Bowel                            Preparation Scale) with scores of: Right Colon = 2,                            Transverse Colon = 3 and Left Colon = 2. The total                            BBPS score equals 7. Scope In: 11:46:29 AM Scope Out: 12:00:33 PM Scope Withdrawal Time: 0 hours 9 minutes 35 seconds  Total Procedure Duration: 0 hours 14 minutes 4 seconds  Findings:                 The perianal and digital rectal examinations were                            normal.  A few small-mouthed diverticula were found in the                            sigmoid colon.                           The exam was otherwise without abnormality on                            direct and retroflexion views. Complications:            No immediate complications. Estimated Blood Loss:     Estimated blood loss: none. Impression:               - Diverticulosis in the sigmoid colon.                           - The examination was otherwise normal on direct                            and retroflexion views.                           - No specimens collected. Recommendation:           - Patient has a contact number available for                            emergencies. The signs and symptoms of potential                            delayed complications were discussed with the                            patient. Return to normal activities tomorrow.                             Written discharge instructions were provided to the                            patient.                           - Resume previous diet.                           - Continue present medications.                           - Repeat colonoscopy in 5 years for screening                            purposes. Hayleen Clinkscales L. Myrtie Neither, MD 12/17/2017 12:03:26 PM This report has been signed electronically.

## 2017-12-20 ENCOUNTER — Telehealth: Payer: Self-pay

## 2017-12-20 NOTE — Telephone Encounter (Signed)
  Follow up Call-  Call back number 12/17/2017  Post procedure Call Back phone  # [phone]  Permission to leave phone message Yes  Some recent data might be hidden     Patient questions:  Do you have a fever, pain , or abdominal swelling? No. Pain Score  0 *  Have you tolerated food without any problems? Yes.    Have you been able to return to your normal activities? Yes.    Do you have any questions about your discharge instructions: Diet   No. Medications  No. Follow up visit  No.  Do you have questions or concerns about your Care? No.  Actions: * If pain score is 4 or above: No action needed, pain <4.

## 2018-01-04 ENCOUNTER — Ambulatory Visit (HOSPITAL_BASED_OUTPATIENT_CLINIC_OR_DEPARTMENT_OTHER): Payer: 59 | Attending: Cardiology

## 2018-01-05 ENCOUNTER — Ambulatory Visit: Payer: 59 | Admitting: Cardiology

## 2018-01-24 NOTE — Addendum Note (Signed)
Addended by: Crist FatLOCKHART, CATHERINE P on: 01/24/2018 10:26 AM   Modules accepted: Orders

## 2018-01-28 ENCOUNTER — Telehealth: Payer: Self-pay | Admitting: *Deleted

## 2018-01-28 NOTE — Telephone Encounter (Signed)
[facility] submitted to [facility] for in lab sleep study via insurance portal.

## 2018-01-28 NOTE — Telephone Encounter (Signed)
-----   Message from Catherine P Lockhart, RN sent at 01/26/2018  8:18 AM EDT ----- Regarding: RE: Please schedule Good morning Wanda,   In Dr. Krasowski's office note on 12/15/17, he states this patient has a history of obstructive sleep apnea. Thank you!  Catherine, RN  ----- Message ----- From: Waddell, Wanda M, CMA Sent: 01/25/2018   4:01 PM To: Catherine P Lockhart, RN Subject: RE: Please schedule                            Does this patient have a history of OSA and the MD is just doing a f/u study due to massive weight loss.? If no prior history I will need symptoms documented please. Thanks!   ----- Message ----- From: Lockhart, Catherine P, RN Sent: 01/24/2018  10:24 AM To: [facility] Sleep Studies Subject: Please schedule                                Please schedule this patient for a sleep study in evaluation of sleep apnea. Thanks!   

## 2018-01-31 ENCOUNTER — Telehealth: Payer: Self-pay | Admitting: *Deleted

## 2018-01-31 NOTE — Telephone Encounter (Signed)
-----   Message from Catherine P Lockhart, RN sent at 01/26/2018  8:18 AM EDT ----- Regarding: RE: Please schedule Good morning Wanda,   In Dr. Krasowski's office note on 12/15/17, he states this patient has a history of obstructive sleep apnea. Thank you!  Catherine, RN  ----- Message ----- From: Waddell, Wanda M, CMA Sent: 01/25/2018   4:01 PM To: Catherine P Lockhart, RN Subject: RE: Please schedule                            Does this patient have a history of OSA and the MD is just doing a f/u study due to massive weight loss.? If no prior history I will need symptoms documented please. Thanks!   ----- Message ----- From: Lockhart, Catherine P, RN Sent: 01/24/2018  10:24 AM To: [facility] Sleep Studies Subject: Please schedule                                Please schedule this patient for a sleep study in evaluation of sleep apnea. Thanks!   

## 2018-01-31 NOTE — Telephone Encounter (Signed)
Additional clinic notes faxed to [facility] per their request for sleep study PA.

## 2018-02-02 ENCOUNTER — Telehealth: Payer: Self-pay | Admitting: *Deleted

## 2018-02-02 NOTE — Telephone Encounter (Signed)
-----   Message from Crist Fatatherine P Lockhart, RN sent at 01/26/2018  8:18 AM EDT ----- Regarding: RE: Please schedule Good morning Burna MortimerWanda,   In Dr. Vanetta ShawlKrasowski's office note on 12/15/17, he states this patient has a history of obstructive sleep apnea. Thank you!  Santina Evansatherine, RN  ----- Message ----- From: Gaynelle CageWaddell, Wanda M, CMA Sent: 01/25/2018   4:01 PM To: Crist Fatatherine P Lockhart, RN Subject: RE: Please schedule                            Does this patient have a history of OSA and the MD is just doing a f/u study due to massive weight loss.? If no prior history I will need symptoms documented please. Thanks!   ----- Message ----- From: Crist FatLockhart, Catherine P, RN Sent: 01/24/2018  10:24 AM To: [facility] Sleep Studies Subject: Please schedule                                Please schedule this patient for a sleep study in evaluation of sleep apnea. Thanks!

## 2018-02-02 NOTE — Telephone Encounter (Signed)
Staff message sent to [employer] approval received. Okay to schedule sleep study.

## 2018-02-03 ENCOUNTER — Telehealth: Payer: Self-pay | Admitting: *Deleted

## 2018-02-03 NOTE — Telephone Encounter (Signed)
Patient informed that sleep study has been [facility] for Tuesday, 03/01/18 [facility]. Patient informed that he needs to be there at 7:45 pm. Patient was driving at the time, so he is going to call back for the address. No further questions.

## 2018-03-01 ENCOUNTER — Encounter (HOSPITAL_BASED_OUTPATIENT_CLINIC_OR_DEPARTMENT_OTHER): Payer: 59

## 2018-04-03 ENCOUNTER — Encounter (HOSPITAL_COMMUNITY): Payer: Self-pay

## 2018-04-03 ENCOUNTER — Other Ambulatory Visit: Payer: Self-pay

## 2018-04-03 ENCOUNTER — Emergency Department (HOSPITAL_COMMUNITY)
Admission: EM | Admit: 2018-04-03 | Discharge: 2018-04-04 | Disposition: A | Discharge status: Home / Self Care | Payer: 59 | Attending: Emergency Medicine | Admitting: Emergency Medicine

## 2018-04-03 DIAGNOSIS — Z8673 Personal history of transient ischemic attack (TIA), and cerebral infarction without residual deficits: Secondary | ICD-10-CM | POA: Diagnosis not present

## 2018-04-03 DIAGNOSIS — Z79899 Other long term (current) drug therapy: Secondary | ICD-10-CM | POA: Diagnosis not present

## 2018-04-03 DIAGNOSIS — I252 Old myocardial infarction: Secondary | ICD-10-CM | POA: Insufficient documentation

## 2018-04-03 DIAGNOSIS — I2510 Atherosclerotic heart disease of native coronary artery without angina pectoris: Secondary | ICD-10-CM | POA: Diagnosis not present

## 2018-04-03 DIAGNOSIS — Z9884 Bariatric surgery status: Secondary | ICD-10-CM | POA: Diagnosis not present

## 2018-04-03 DIAGNOSIS — I10 Essential (primary) hypertension: Secondary | ICD-10-CM | POA: Insufficient documentation

## 2018-04-03 DIAGNOSIS — K921 Melena: Secondary | ICD-10-CM | POA: Diagnosis not present

## 2018-04-03 LAB — POC OCCULT BLOOD, ED: Fecal Occult Bld: NEGATIVE

## 2018-04-03 NOTE — ED Notes (Signed)
Pt reports having 2 bowel movements this date, 1 this morning and 1 this evening both with bright red blood. Pt denies any hemorrhoids or history of same. Pt also denies constipation.

## 2018-04-03 NOTE — ED Triage Notes (Signed)
Onset today bright red blood in stool x 2.  No known hemorrhoids or cause.

## 2018-04-03 NOTE — ED Provider Notes (Signed)
[facility]ENC[facility] Provider Note   CSN: [id number] Arrival date & time: 04/03/18  2205    History   Chief Complaint Chief Complaint  Patient presents with  . Rectal Bleeding    HPI Brian Velez is a 52 y.o. male.   52 year old male with a history of CVA, MI, hypertension, not on chronic anticoagulation, presents to the emergency department for evaluation of hematochezia x2.  Reports one episode earlier in the day with subsequent episode at 1930 tonight.  Denies any constipated bowel movements.  States that his stool was brown in color.  Has not had any pain with defecation or tenesmus.  Began experiencing some suprapubic abdominal discomfort around the time of his second episode of hematochezia today.  Pain is nonradiating, aching, cramping.  Denies taking any medications for symptoms prior to arrival.  No history of hemorrhoids or any associated fevers, nausea, vomiting.  Has history of colon cancer in his father and brother.  Had a reassuring colonoscopy in May 2019 which showed a few scattered diverticula.  Denies ever having had diverticulitis.  No hx of abdominal surgeries.     Past Medical History:  Diagnosis Date  . History of gout   . Hypertension   . MI (myocardial infarction) (HCC) 2004  . Sleep apnea    "don't wear mask anymore"  . Stroke [facility](HCC) 05/27/2014   "right side still weaker" (05/31/2014)    Patient Active Problem List   Diagnosis Date Noted  . Coronary artery disease 12/15/2017  . Hyperlipidemia 12/10/2017  . Neck pain 12/10/2017  . Radicular pain in right arm 12/10/2017  . Shortness of breath 12/10/2017  . History of myocardial infarction 11/19/2017  . History of stroke 11/19/2017  . Heart disease 11/19/2017  . History of gout 11/19/2017  . Vaccine counseling 11/19/2017  . Need for Tdap vaccination 11/19/2017  . Insomnia 11/19/2017  . Essential hypertension 10/19/2017  . Family history of hypertension 10/19/2017    . Family history of prostate cancer 10/19/2017  . Family history of colon cancer 10/19/2017  . Family history of diabetes mellitus 10/19/2017  . Encounter for hepatitis C screening test for low risk patient 10/19/2017  . 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 Right hemiplegia (HCC) 05/31/2014  . Chest pain 05/31/2014    Past Surgical History:  Procedure Laterality Date  . ANKLE FRACTURE SURGERY Left 2000's  . CARDIAC CATHETERIZATION  2004; 2014  . HAND SURGERY     left palm and fingertip injury  . ROUX-EN-Y GASTRIC BYPASS  2013  . SKIN BIOPSY          Home Medications    Prior to Admission medications   Medication Sig Start Date End Date Taking? Authorizing Provider  metoprolol tartrate (LOPRESSOR) 25 MG tablet Take 1 tablet (25 mg total) by mouth 2 (two) times daily. 12/15/17 12/15/18 Yes Tysinger, Kermit Baloavid S, PA-C  olmesartan (BENICAR) 20 MG tablet Take 1 tablet (20 mg total) by mouth daily. 11/21/17  Yes Tysinger, Kermit Baloavid S, PA-C  pravastatin (PRAVACHOL) 20 MG tablet Take 1 tablet (20 mg total) by mouth every evening. 11/21/17 11/21/18 Yes Tysinger, Kermit Baloavid S, PA-C  zolpidem (AMBIEN) 10 MG tablet Take 1 tablet (10 mg total) by mouth at bedtime as needed for sleep. 12/15/17 04/04/26 Yes Tysinger, Kermit Baloavid S, PA-C  hydrochlorothiazide (HYDRODIURIL) 12.5 MG tablet Take 1 tablet (12.5 mg total) by mouth daily. Patient not taking: Reported on 12/17/2017 11/21/17   Tysinger, Kermit Baloavid S, PA-C  triamcinolone cream (KENALOG)  0.1 % Apply 1 application topically 2 (two) times daily. Patient not taking: Reported on 04/04/2018 11/19/17   Tysinger, Kermit Balo, PA-C    Family History Family History  Problem Relation Age of Onset  . Dementia Mother        died of dementia  . Breast cancer Mother   . Hypertension Mother   . Heart disease Mother   . Cancer Mother        breast  . Diabetes Mother   . CAD Father   . Stroke Father   . Prostate cancer Father 6  . Cancer Father 77       colon  . Hypertension  Father   . Diabetes Father   . Heart disease Father   . Colon cancer Father   . Diabetes type II Sister   . Hypertension Sister   . Diabetes Sister   . Diabetes type II Brother   . Hypertension Brother   . Diabetes Brother   . Colon polyps Brother   . Diabetes type II Sister   . Hypertension Sister   . Diabetes Sister   . Diabetes type II Sister   . Hypertension Sister   . Stroke Brother   . Colon cancer Brother   . Colon cancer Paternal Uncle   . Colon cancer Paternal Uncle   . Esophageal cancer Neg Hx   . Rectal cancer Neg Hx   . Stomach cancer Neg Hx     Social History Social History   Tobacco Use  . Smoking status: Never Smoker  . Smokeless tobacco: Never Used  Substance Use Topics  . Alcohol use: No  . Drug use: No     Allergies   Patient has no known allergies.   Review of Systems Review of Systems Ten systems reviewed and are negative for acute change, except as noted in the HPI.    Physical Exam Updated Vital Signs BP 138/88   Pulse (!) 49   Temp 98 F (36.7 C) (Oral)   Resp 16   Ht 5\' 9"  (1.753 m)   Wt 90.7 kg   SpO2 96%   BMI 29.53 kg/m   Physical Exam  Constitutional: He is oriented to person, place, and time. He appears well-developed and well-nourished. No distress.  Nontoxic appearing and in NAD  HENT:  Head: Normocephalic and atraumatic.  Eyes: Conjunctivae and EOM are normal. No scleral icterus.  Neck: Normal range of motion.  Cardiovascular: Normal rate, regular rhythm and intact distal pulses.  Pulmonary/Chest: Effort normal. No respiratory distress.  Respirations even and unlabored  Abdominal: Soft. He exhibits no mass. There is tenderness (mild suprapubic TTP). There is no guarding.  Abdomen soft, nondistended. No peritoneal signs.  Genitourinary:  Genitourinary Comments: Exam chaperoned by Marylene Land, RN. Normal rectal tone. No evidence of fissure, external hemorrhoid, internal hemorrhoid. No melena or hematochezia visualized on  DRE.  Musculoskeletal: Normal range of motion.  Neurological: He is alert and oriented to person, place, and time. He exhibits normal muscle tone. Coordination normal.  Skin: Skin is warm and dry. No rash noted. He is not diaphoretic. No erythema. No pallor.  Psychiatric: He has a normal mood and affect. His behavior is normal.  Nursing note and vitals reviewed.    ED Treatments / Results  Labs (all labs ordered are listed, but only abnormal results are displayed) Labs Reviewed  COMPREHENSIVE METABOLIC PANEL - Abnormal; Notable for the following components:      Result Value   Glucose,  Bld 106 (*)    Total Protein 6.3 (*)    Total Bilirubin 1.5 (*)    All other components within normal limits  CBC  POC OCCULT BLOOD, ED    EKG None  Radiology No results found.  Procedures Procedures (including critical care time)  Medications Ordered in ED Medications - No data to display   12:45 AM Repeat abdominal exam stable.  Patient denies any tenderness on repeat exam.   Initial Impression / Assessment and Plan / ED Course  I have reviewed the triage vital signs and the nursing notes.  Pertinent labs & imaging results that were available during my care of the patient were reviewed by me and considered in my medical decision making (see chart for details).     52 year old male presents to the emergency department for evaluation of hematochezia x2.  Denies constipated bowel movements.  No associated fevers, nausea, vomiting.  Did experience some lower abdominal discomfort after second episode of hematochezia tonight.  The patient is not on chronic anticoagulation.  Denies frequent use of NSAIDs or aspirin.  Patient with no peritoneal signs on abdominal exam.  His bedside DRE is negative for blood.  Formal hemoccult negative as well.  Hemoglobin is stable compared with baseline.  Patient has no leukocytosis to suggest concomitant infection.  No electrolyte derangements.  Liver and  kidney function preserved.  Given reassuring ED evaluation, I believe the patient is appropriate for outpatient GI follow-up.  He has also been instructed to follow-up with his primary care doctor.  Return precautions discussed and provided. Patient discharged in stable condition with no unaddressed concerns.   Vitals:   04/03/18 2345 04/04/18 0000 04/04/18 0015 04/04/18 0030  BP: (!) 143/92 132/85 132/88 138/88  Pulse: (!) 55 (!) 57 (!) 56 (!) 49  Resp: 16   16  Temp:      TempSrc:      SpO2: 99% 97% 96% 96%  Weight:      Height:        Final Clinical Impressions(s) / ED Diagnoses   Final diagnoses:  Hematochezia    ED Discharge Orders    None       Antony Madura, PA-C 04/04/18 0156    Cathren Laine, MD 04/04/18 1623

## 2018-04-03 NOTE — ED Notes (Signed)
Occult card at bedside 

## 2018-04-04 LAB — COMPREHENSIVE METABOLIC PANEL
ALBUMIN: 3.9 g/dL (ref 3.5–5.0)
ALT: 14 U/L (ref 0–44)
ANION GAP: 8 (ref 5–15)
AST: 21 U/L (ref 15–41)
Alkaline Phosphatase: 66 U/L (ref 38–126)
BUN: 11 mg/dL (ref 6–20)
CO2: 27 mmol/L (ref 22–32)
Calcium: 9 mg/dL (ref 8.9–10.3)
Chloride: 108 mmol/L (ref 98–111)
Creatinine, Ser: 1.21 mg/dL (ref 0.61–1.24)
GFR calc non Af Amer: >60 mL/min (ref >60)
GLUCOSE: 106 mg/dL — AB (ref 70–99)
POTASSIUM: 3.5 mmol/L (ref 3.5–5.1)
SODIUM: 143 mmol/L (ref 135–145)
Total Bilirubin: 1.5 mg/dL — ABNORMAL HIGH (ref 0.3–1.2)
Total Protein: 6.3 g/dL — ABNORMAL LOW (ref 6.5–8.1)

## 2018-04-04 LAB — CBC
HCT: 41.6 % (ref 39.0–52.0)
Hemoglobin: 14 g/dL (ref 13.0–17.0)
MCH: 29.5 pg (ref 26.0–34.0)
MCHC: 33.7 g/dL (ref 30.0–36.0)
MCV: 87.8 fL (ref 78.0–100.0)
PLATELETS: 250 10*3/uL (ref 150–400)
RBC: 4.74 MIL/uL (ref 4.22–5.81)
RDW: 12.8 % (ref 11.5–15.5)
WBC: 10.4 10*3/uL (ref 4.0–10.5)

## 2018-04-04 NOTE — Discharge Instructions (Signed)
You had no evidence of rectal bleeding on your evaluation today.  Your hemoglobin level is normal and stable compared with prior evaluations.  Your symptoms may have been caused by an internal bleeding hemorrhoid or small amounts of bleeding from your known diverticula.  Diverticula are pockets in your colon which can bleed at times.  This bleeding usually resolves without further intervention.  We do recommend close follow-up with your gastroenterologist.  Call tomorrow to schedule an appointment for office follow-up.  In the interim, we recommend follow-up with your primary care doctor.  Return to the emergency department for any new or concerning symptoms such as worsening abdominal pain, fever over 100.4 F, increased or persistent bloody bowel movements, lightheadedness, or passing out.

## 2018-04-29 ENCOUNTER — Other Ambulatory Visit: Payer: Self-pay | Admitting: Medical

## 2018-05-02 NOTE — Telephone Encounter (Signed)
Is this ok to refill?  

## 2018-05-21 ENCOUNTER — Other Ambulatory Visit: Payer: Self-pay | Admitting: Medical

## 2018-05-23 NOTE — Telephone Encounter (Signed)
Is this ok to refill?  

## 2018-09-08 ENCOUNTER — Telehealth: Payer: Self-pay | Admitting: Medical

## 2018-09-08 NOTE — Telephone Encounter (Signed)
Dismissal letter in guarantor snapshot  °

## 2018-10-16 ENCOUNTER — Emergency Department (HOSPITAL_COMMUNITY): Payer: 59

## 2018-10-16 ENCOUNTER — Encounter (HOSPITAL_COMMUNITY): Payer: Self-pay

## 2018-10-16 ENCOUNTER — Emergency Department (HOSPITAL_COMMUNITY)
Admission: EM | Admit: 2018-10-16 | Discharge: 2018-10-16 | Disposition: A | Discharge status: Home / Self Care | Payer: 59 | Attending: Emergency Medicine | Admitting: Emergency Medicine

## 2018-10-16 DIAGNOSIS — M79675 Pain in left toe(s): Secondary | ICD-10-CM | POA: Diagnosis not present

## 2018-10-16 DIAGNOSIS — I10 Essential (primary) hypertension: Secondary | ICD-10-CM | POA: Insufficient documentation

## 2018-10-16 DIAGNOSIS — Z79899 Other long term (current) drug therapy: Secondary | ICD-10-CM | POA: Diagnosis not present

## 2018-10-16 MED ORDER — PREDNISONE 20 MG PO TABS: 40.0000 mg | ORAL_TABLET | Freq: Every day | ORAL | 0 refills | Status: AC

## 2018-10-16 MED ORDER — HYDROCODONE-ACETAMINOPHEN 5-325 MG PO TABS
1.0000 | ORAL_TABLET | Freq: Once | ORAL | Status: AC
Start: 2018-10-16 — End: 2018-10-16
  Administered 2018-10-16: 1 via ORAL
  Filled 2018-10-16: qty 1

## 2018-10-16 MED ORDER — HYDROCODONE-ACETAMINOPHEN 5-325 MG PO TABS: 1.0000 | ORAL_TABLET | Freq: Four times a day (QID) | ORAL | 0 refills | Status: DC | PRN

## 2018-10-16 NOTE — Discharge Instructions (Signed)
Evaluated today for toe pain. Likely gout pain. I have prescribed Prednisone as well as pain medication. Please take as prescribed. Do not drive or operate heavy machinery while taking this medication. Follow up PCP for reevaluation over the next 2 to 3 days.  Return to the ED for any worsening symptoms.

## 2018-10-16 NOTE — ED Provider Notes (Signed)
[facility] EMERGENCY DEPARTMENT Provider Note   CSN: [id number] Arrival date & time: 10/16/18  2037    History   Chief Complaint Chief Complaint  Patient presents with  . Gout    HPI Brian Velez is a 53 y.o. male with past medical history significant for hypertension, MI, CVA, gout who presents for evaluation of left great toe pain.  Pain onset 3 days ago.  Pain has been constant in nature.  He rates his pain a 9/10.  Patient states he does have a history of gout was previously on colchicine, however has not taken this "for a long time."  Has not had a flare in "years."  Been trying Tylenol as well as increasing his fluids to help relieve his symptoms.  States he is also noted redness and warmth to his left great toe.  States feels similar to his previous gout flares.  No known injuries or trauma.  Denies additional aggravating or alleviating factors.  Denies radiation of pain.  States he can walk, however it is painful.  History obtained from patient.  No interpreter was used.     HPI  Past Medical History:  Diagnosis Date  . History of gout   . Hypertension   . MI (myocardial infarction) (HCC) 2004  . Sleep apnea    "don't wear mask anymore"  . Stroke [facility]) 05/27/2014   "right side still weaker" (05/31/2014)    Patient Active Problem List   Diagnosis Date Noted  . Coronary artery disease 12/15/2017  . Hyperlipidemia 12/10/2017  . Neck pain 12/10/2017  . Radicular pain in right arm 12/10/2017  . Shortness of breath 12/10/2017  . History of myocardial infarction 11/19/2017  . History of stroke 11/19/2017  . Heart disease 11/19/2017  . History of gout 11/19/2017  . Vaccine counseling 11/19/2017  . Need for Tdap vaccination 11/19/2017  . Insomnia 11/19/2017  . Essential hypertension 10/19/2017  . Family history of hypertension 10/19/2017  . Family history of prostate cancer 10/19/2017  . Family history of colon cancer 10/19/2017  . Family  history of diabetes mellitus 10/19/2017  . Encounter for hepatitis C screening test for low risk patient 10/19/2017  . 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 Right hemiplegia (HCC) 05/31/2014  . Chest pain 05/31/2014    Past Surgical History:  Procedure Laterality Date  . ANKLE FRACTURE SURGERY Left 2000's  . CARDIAC CATHETERIZATION  2004; 2014  . HAND SURGERY     left palm and fingertip injury  . ROUX-EN-Y GASTRIC BYPASS  2013  . SKIN BIOPSY          Home Medications    Prior to Admission medications   Medication Sig Start Date End Date Taking? Authorizing Provider  hydrochlorothiazide (HYDRODIURIL) 12.5 MG tablet Take 1 tablet (12.5 mg total) by mouth daily. Patient not taking: Reported on 12/17/2017 11/21/17   Tysinger, Kermit Balo, PA-C  HYDROcodone-acetaminophen (NORCO/VICODIN) 5-325 MG tablet Take 1 tablet by mouth every 6 (six) hours as needed for severe pain. 10/16/18   Tyashia Morrisette A, PA-C  metoprolol tartrate (LOPRESSOR) 25 MG tablet TAKE 1 TABLET BY MOUTH TWICE A DAY 05/23/18   Tysinger, Kermit Balo, PA-C  olmesartan (BENICAR) 20 MG tablet Take 1 tablet (20 mg total) by mouth daily. 11/21/17   Tysinger, Kermit Balo, PA-C  pravastatin (PRAVACHOL) 20 MG tablet Take 1 tablet (20 mg total) by mouth every evening. 11/21/17 11/21/18  Tysinger, Kermit Balo, PA-C  predniSONE (DELTASONE) 20 MG tablet Take 2  tablets (40 mg total) by mouth daily for 5 days. 10/16/18 10/21/18  Leiana Rund A, PA-C  triamcinolone cream (KENALOG) 0.1 % Apply 1 application topically 2 (two) times daily. Patient not taking: Reported on 04/04/2018 11/19/17   Jac Canavan, PA-C  zolpidem (AMBIEN) 10 MG tablet TAKE 1 TABLET (10 MG TOTAL) BY MOUTH AT BEDTIME AS NEEDED FOR SLEEP. 05/02/18 06/01/18  Tysinger, Kermit Balo, PA-C    Family History Family History  Problem Relation Age of Onset  . Dementia Mother        died of dementia  . Breast cancer Mother   . Hypertension Mother   . Heart disease Mother   . Cancer Mother         breast  . Diabetes Mother   . CAD Father   . Stroke Father   . Prostate cancer Father 42  . Cancer Father 61       colon  . Hypertension Father   . Diabetes Father   . Heart disease Father   . Colon cancer Father   . Diabetes type II Sister   . Hypertension Sister   . Diabetes Sister   . Diabetes type II Brother   . Hypertension Brother   . Diabetes Brother   . Colon polyps Brother   . Diabetes type II Sister   . Hypertension Sister   . Diabetes Sister   . Diabetes type II Sister   . Hypertension Sister   . Stroke Brother   . Colon cancer Brother   . Colon cancer Paternal Uncle   . Colon cancer Paternal Uncle   . Esophageal cancer Neg Hx   . Rectal cancer Neg Hx   . Stomach cancer Neg Hx     Social History Social History   Tobacco Use  . Smoking status: Never Smoker  . Smokeless tobacco: Never Used  Substance Use Topics  . Alcohol use: No  . Drug use: No     Allergies   Patient has no known allergies.   Review of Systems Review of Systems  Constitutional: Negative.   HENT: Negative.   Respiratory: Negative.   Cardiovascular: Negative.   Gastrointestinal: Negative.   Genitourinary: Negative.   Musculoskeletal: Positive for gait problem.       Left great toe pain.  Skin: Negative.   All other systems reviewed and are negative.    Physical Exam Updated Vital Signs BP (!) 149/96 (BP Location: Right Arm)   Pulse 74   Temp 98.5 F (36.9 C) (Oral)   Resp 18   SpO2 100%   Physical Exam Vitals signs and nursing note reviewed.  Constitutional:      General: He is not in acute distress.    Appearance: He is well-developed. He is not ill-appearing, toxic-appearing or diaphoretic.  HENT:     Head: Normocephalic and atraumatic.     Nose: Nose normal.     Mouth/Throat:     Mouth: Mucous membranes are moist.     Pharynx: Oropharynx is clear.  Eyes:     Pupils: Pupils are equal, round, and reactive to light.  Neck:     Musculoskeletal:  Normal range of motion and neck supple.  Cardiovascular:     Rate and Rhythm: Normal rate and regular rhythm.     Pulses: Normal pulses.     Heart sounds: Normal heart sounds.  Pulmonary:     Effort: Pulmonary effort is normal. No respiratory distress.     Breath sounds: Normal  breath sounds.  Abdominal:     General: There is no distension.     Palpations: Abdomen is soft.     Tenderness: There is no abdominal tenderness. There is no guarding or rebound.  Musculoskeletal: Normal range of motion.     Right knee: Normal.     Left knee: Normal.     Right ankle: Normal.     Left ankle: Normal.     Right lower leg: Normal.     Left lower leg: Normal.     Right foot: Normal range of motion and normal capillary refill. Tenderness present. No bony tenderness, swelling, crepitus, deformity or laceration.     Left foot: Normal.     Comments: Tenderness palpation to base of left great toe.  Surrounding erythema and warmth.  Able to wiggle toes without difficulty.  Full range of motion without difficulty to BLE. No soft tissue swelling. BLE compartments are soft.  Skin:    General: Skin is warm and dry.     Comments: Erythema and warmth to base of left great toe.  No rashes, lesions.  Neurological:     Mental Status: He is alert.     Comments: 5/5 bilateral lower extremity.  Intact sensation.  Able to ambulate, but has pain to his left great toe.    ED Treatments / Results  Labs (all labs ordered are listed, but only abnormal results are displayed) Labs Reviewed - No data to display  EKG None  Radiology Dg Toe Great Left  Result Date: 10/16/2018 CLINICAL DATA:  Gout and swelling. EXAM: LEFT GREAT TOE COMPARISON:  None. FINDINGS: No fractures, dislocations, or or bony erosion. Radiodensity in the soft tissues of the distal first toe seen on the AP and oblique views. IMPRESSION: 1. No sequela of gout identified. 2. Rounded radiodensity in the soft tissues of the great toe may represent a  foreign body or soft tissue calcification. Recommend clinical correlation. Electronically Signed   By: Gerome Sam III M.D   On: 10/16/2018 22:30    Procedures Procedures (including critical care time)  Medications Ordered in ED Medications  HYDROcodone-acetaminophen (NORCO/VICODIN) 5-325 MG per tablet 1 tablet (1 tablet Oral Given 10/16/18 2115)     Initial Impression / Assessment and Plan / ED Course  I have reviewed the triage vital signs and the nursing notes.  Pertinent labs & imaging results that were available during my care of the patient were reviewed by me and considered in my medical decision making (see chart for details).  53 year old male appears otherwise well presents for evaluation of possible gout flare.  Afebrile, nonseptic, non-ill-appearing.  Onset 3 days ago.  Erythema, tenderness and warmth to base of left great toe. Normal musculoskeletal exam, neurovascularly intact. No sx of systemic illness. Patient is able to ambulate, however has pain to his left great toe. Will provide pain management as well as imaging of his left great toe and reevaluate. Patient has ride home. No diffuse soft tissue swelling. No bony tenderness. No pain to calves. BLE compartments are soft.  Xray without acute findings. Likely gout flair given hx of previous. Low suspicion for septic joint, hemarthrosis, acute fracture or dislocation, osteomyelitis, DVT. No evidence of cellulitis, induration or fluctuance. Will dc home with Prednisone and pain medication. No hx of diabetes.  Patient hemodynamically stable and appropriate for DC home at this time.  Discussed strict return precautions.  Patient voiced understanding and is agreeable to follow-up.  Patient has had mildly elevated blood  pressure in department. Has history of hypertension.  Denies headache, vision changes, chest pain, shortness of breath, nausea, vomiting or abdominal pain.  Discussed with patient follow-up with PCP for reevaluation of  his blood pressure.     Final Clinical Impressions(s) / ED Diagnoses   Final diagnoses:  Pain of toe of left foot    ED Discharge Orders         Ordered    predniSONE (DELTASONE) 20 MG tablet  Daily     10/16/18 2235    HYDROcodone-acetaminophen (NORCO/VICODIN) 5-325 MG tablet  Every 6 hours PRN     10/16/18 2235           Talulah Schirmer A, PA-C 10/16/18 2244    Melene Plan, DO 10/16/18 2311

## 2018-10-16 NOTE — ED Triage Notes (Signed)
Pt states that he is having a GOUT flare up in his L great toe for he past 3 days

## 2018-10-27 ENCOUNTER — Ambulatory Visit: Payer: 59 | Admitting: Family Medicine

## 2018-10-28 ENCOUNTER — Encounter: Payer: Self-pay | Admitting: Family Medicine

## 2018-10-28 ENCOUNTER — Ambulatory Visit: Payer: 59 | Admitting: Family Medicine

## 2018-10-28 ENCOUNTER — Other Ambulatory Visit: Payer: Self-pay

## 2018-10-28 ENCOUNTER — Ambulatory Visit (INDEPENDENT_AMBULATORY_CARE_PROVIDER_SITE_OTHER): Payer: 59 | Admitting: Family Medicine

## 2018-10-28 VITALS — BP 132/84 | HR 54 | Temp 98.3°F | Ht 69.0 in | Wt 201.0 lb

## 2018-10-28 DIAGNOSIS — I2510 Atherosclerotic heart disease of native coronary artery without angina pectoris: Secondary | ICD-10-CM

## 2018-10-28 DIAGNOSIS — M1A9XX0 Chronic gout, unspecified, without tophus (tophi): Secondary | ICD-10-CM

## 2018-10-28 DIAGNOSIS — G4700 Insomnia, unspecified: Secondary | ICD-10-CM

## 2018-10-28 LAB — COMPREHENSIVE METABOLIC PANEL
ALBUMIN: 3.9 g/dL (ref 3.5–5.2)
ALT: 11 U/L (ref 0–53)
AST: 14 U/L (ref 0–37)
Alkaline Phosphatase: 73 U/L (ref 39–117)
BUN: 16 mg/dL (ref 6–23)
CO2: 31 mEq/L (ref 19–32)
Calcium: 8.7 mg/dL (ref 8.4–10.5)
Chloride: 105 mEq/L (ref 96–112)
Creatinine, Ser: 1.04 mg/dL (ref 0.40–1.50)
GFR: 90.73 mL/min (ref >60.00)
Glucose, Bld: 90 mg/dL (ref 70–99)
Potassium: 3.9 mEq/L (ref 3.5–5.1)
Sodium: 142 mEq/L (ref 135–145)
Total Bilirubin: 0.7 mg/dL (ref 0.2–1.2)
Total Protein: 6.5 g/dL (ref 6.0–8.3)

## 2018-10-28 LAB — URIC ACID: URIC ACID, SERUM: 4.5 mg/dL (ref 4.0–7.8)

## 2018-10-28 MED ORDER — METHYLPREDNISOLONE SODIUM SUCC 125 MG IJ SOLR
125.0000 mg | Freq: Once | INTRAMUSCULAR | Status: AC
  Administered 2018-10-28: 125 mg via INTRAMUSCULAR

## 2018-10-28 MED ORDER — ATORVASTATIN CALCIUM 40 MG PO TABS: 40.0000 mg | ORAL_TABLET | Freq: Every day | ORAL | 11 refills | Status: DC

## 2018-10-28 MED ORDER — COLCHICINE 0.6 MG PO TABS: ORAL_TABLET | ORAL | 2 refills | Status: DC

## 2018-10-28 MED ORDER — TRAZODONE HCL 50 MG PO TABS: 25.0000 mg | ORAL_TABLET | Freq: Every evening | ORAL | 3 refills | Status: DC | PRN

## 2018-10-28 MED ORDER — METOPROLOL TARTRATE 25 MG PO TABS: 25.0000 mg | ORAL_TABLET | Freq: Two times a day (BID) | ORAL | 5 refills | Status: DC

## 2018-10-28 NOTE — Patient Instructions (Addendum)
Take a baby aspirin daily.   Keep the diet clean and stay active.  Foods to AVOID: Red meat, organ meat (liver), lunch meat, seafood (mussels, scallops, anchovies, etc) Alcohol Sugary foods/beverages (diet soft drinks have no link to flares)  Foods to migrate to: Dairy Vegetables Cherries have limited data to suggest they help lower uric acid levels (and prevent flares) Vit C (500 mg daily) may have a modest effect with preventing flares Poultry If you are going to eat red meat, beef and pork may give you less problems than lamb.   Let us know if you need anything.  Give [place] 2-3 business days to get the results of your labs back.   Sleep is important to [place] all. Getting good sleep is imperative to adequate functioning during the day. Work with our counselors who are trained to help people obtain quality sleep. Call [phone] to schedule an appointment or if you are curious about insurance coverage/cost.  Sleep Hygiene Tips:  Do not watch TV or look at screens within 1 hour of going to bed. If you do, make sure there is a blue light filter (nighttime mode) involved.  Try to go to bed around the same time every night. Wake up at the same time within 1 hour of regular time. Ex: If you wake up at 7 AM for work, do not sleep past 8 AM on days that you don't work.  Do not drink alcohol before bedtime.  Do not consume caffeine-containing beverages after noon or within 9 hours of intended bedtime.  Get regular exercise/physical activity in your life, but not within 2 hours of planned bedtime.  Do not take naps.   Do not eat within 2 hours of planned bedtime.  Melatonin, 3-5 mg 30-60 minutes before planned bedtime may be helpful.   The bed should be for sleep or sex only. If after 20-30 minutes you are unable to fall asleep, get up and do something relaxing. Do this until you feel ready to go to sleep again.   Let us know if you need anything.

## 2018-10-28 NOTE — Progress Notes (Signed)
Chief Complaint  Patient presents with  . Gout    both big toes       New Patient Visit SUBJECTIVE: HPI: Brian Velez is an 53 y.o.male who is being seen for establishing care.  The patient was previously seen at [facility].  Patient has a history of chronic gout, mainly affecting his left first MTP.  He does not take anything routinely, he used to be on colchicine.  He had a flare that is been going on for 3 weeks now.  It is better overall, but he was forced to go to the emergency department on 10/16/2018.  He was prescribed a prednisone burst in addition to hydrocodone.  He still having intermittent pangs of pain.  He is no longer having the redness, swelling, or constant ache.  No recent change in diet.  His uric acid has not been checked in a long time.  He has not had refills of his chronic medications in quite some time either.  The patient has a history of both heart attack and stroke.  He was prescribed metoprolol tartrate twice daily in addition to pravastatin.  Has never tried atorvastatin or rosuvastatin.  Patient has a history of insomnia.  He was prescribed Ambien in the past that worked well for him.  He has no problems falling asleep but does have issues waking up and being unable to fall back asleep.  He does not describe himself as an anxious person.  He has not tried anything other than Ambien in the past.  He has never followed with a counselor or had a cognitive behavioral therapist.  He will nap during the day.  No Known Allergies  Past Medical History:  Diagnosis Date  . History of gout   . Hypertension   . MI (myocardial infarction) (HCC) 2004  . Sleep apnea    "don't wear mask anymore"  . Stroke [facility]) 05/27/2014   "right side still weaker" (05/31/2014)   Past Surgical History:  Procedure Laterality Date  . ANKLE FRACTURE SURGERY Left 2000's  . CARDIAC CATHETERIZATION  2004; 2014  . HAND SURGERY     left palm and fingertip injury  . ROUX-EN-Y GASTRIC  BYPASS  2013  . SKIN BIOPSY     No Known Allergies  Current Outpatient Medications:  .  atorvastatin (LIPITOR) 40 MG tablet, Take 1 tablet (40 mg total) by mouth daily., Disp: 30 tablet, Rfl: 11 .  colchicine 0.6 MG tablet, Take 2 tabs and repeat in 1 hour at onset of flare then take 1 tab daily until resolution., Disp: 30 tablet, Rfl: 2 .  metoprolol tartrate (LOPRESSOR) 25 MG tablet, Take 1 tablet (25 mg total) by mouth 2 (two) times daily., Disp: 60 tablet, Rfl: 5 .  traZODone (DESYREL) 50 MG tablet, Take 0.5-1 tablets (25-50 mg total) by mouth at bedtime as needed for sleep., Disp: 30 tablet, Rfl: 3  ROS Const: Denies fevers  MSK: +toe pain   OBJECTIVE: BP 132/84 (BP Location: Left Arm, Patient Position: Sitting, Cuff Size: Normal)   Pulse (!) 54   Temp 98.3 F (36.8 C) (Oral)   Ht 5\' 9"  (1.753 m)   Wt 201 lb (91.2 kg)   SpO2 96%   BMI 29.68 kg/m   Constitutional: -  VS reviewed -  Well developed, well nourished, appears stated age -  No apparent distress  Psychiatric: -  Oriented to person, place, and time -  Memory intact -  Affect and mood normal -  Fluent conversation, good eye contact -  Judgment and insight age appropriate  Eye: -  Conjunctivae clear, no discharge -  Pupils symmetric, round, reactive to light  ENMT: -  MMM    Pharynx moist, no exudate, no erythema  Neck: -  No gross swelling, no palpable masses -  Thyroid midline, not enlarged, mobile, no palpable masses  Cardiovascular: -  RRR -  No LE edema  Respiratory: -  Normal respiratory effort, no accessory muscle use, no retraction -  Breath sounds equal, no wheezes, no ronchi, no crackles  Neurological:  -  CN II - XII grossly intact -  Sensation intact to light touch  Musculoskeletal: -  Antalgic gait -  +ttp over L 1st MTP, mild edema, mild warmth, no erythema  Skin: -  No significant lesion on inspection -  Warm and dry to palpation   ASSESSMENT/PLAN: Insomnia, unspecified type - Plan:  traZODone (DESYREL) 50 MG tablet  Chronic gout involving toe of left foot without tophus, unspecified cause - Plan: colchicine 0.6 MG tablet, Comprehensive metabolic panel, Uric acid, methylPREDNISolone sodium succinate (SOLU-MEDROL) 125 mg/2 mL injection 125 mg  Coronary artery disease involving native coronary artery of native heart without angina pectoris - Plan: atorvastatin (LIPITOR) 40 MG tablet, metoprolol tartrate (LOPRESSOR) 25 MG tablet  Start trazodone. CBT info given. Sleep hygiene. Solumedrol today. Gout diet given. Colchicine for flare, may need allopurinol. Ck renal function. Change pravastatin to atorvastatin. Refill lopressor. Patient should return in 1 mo. The patient voiced understanding and agreement to the plan.   Jilda Roche [place], DO 10/28/18  2:09 PM

## 2018-10-31 ENCOUNTER — Other Ambulatory Visit: Payer: Self-pay | Admitting: Medical

## 2018-10-31 DIAGNOSIS — M10072 Idiopathic gout, left ankle and foot: Secondary | ICD-10-CM

## 2018-11-09 ENCOUNTER — Ambulatory Visit: Payer: 59 | Admitting: Podiatry

## 2018-11-18 ENCOUNTER — Telehealth: Payer: Self-pay | Admitting: Family Medicine

## 2018-11-18 DIAGNOSIS — M1A9XX0 Chronic gout, unspecified, without tophus (tophi): Secondary | ICD-10-CM

## 2018-11-18 NOTE — Telephone Encounter (Signed)
Copied from CRM [id number]. Topic: Quick Communication - Rx Refill/Question >> Nov 18, 2018  8:36 AM Herby Abraham C wrote: Medication: colchicine 0.6 MG tablet   Has the patient contacted their pharmacy? No  (Agent: If no, request that the patient contact the pharmacy for the refill.) (Agent: If yes, when and what did the pharmacy advise?)  Preferred Pharmacy (with phone number or street name): [id number] - [place], [place] - [address] AT [address] OF [facility] [address] [phone] (Phone) [phone] (Fax)    Agent: Please be advised that RX refills may take up to 3 business days. We ask that you follow-up with your pharmacy.

## 2018-11-21 MED ORDER — COLCHICINE 0.6 MG PO TABS: ORAL_TABLET | ORAL | 2 refills | Status: DC

## 2018-11-22 ENCOUNTER — Encounter: Payer: Self-pay | Admitting: Family Medicine

## 2018-11-22 ENCOUNTER — Other Ambulatory Visit: Payer: Self-pay

## 2018-11-22 ENCOUNTER — Ambulatory Visit (INDEPENDENT_AMBULATORY_CARE_PROVIDER_SITE_OTHER): Payer: 59 | Admitting: Family Medicine

## 2018-11-22 DIAGNOSIS — M109 Gout, unspecified: Secondary | ICD-10-CM | POA: Diagnosis not present

## 2018-11-22 MED ORDER — PREDNISONE 20 MG PO TABS: 40.0000 mg | ORAL_TABLET | Freq: Every day | ORAL | 1 refills | Status: DC

## 2018-11-22 NOTE — Patient Instructions (Signed)
Foods to AVOID: Red meat, organ meat (liver), lunch meat, seafood (mussels, scallops, anchovies, etc) Alcohol Sugary foods/beverages (diet soft drinks have no link to flares)  Foods to migrate to: Dairy Vegetables Cherries have limited data to suggest they help lower uric acid levels (and prevent flares) Vit C (500 mg daily) may have a modest effect with preventing flares Poultry If you are going to eat red meat, beef and pork may give you less problems than lamb.   Give [place] 2-3 business days to get the results of your labs back.   Let us know if you need anything.

## 2018-11-22 NOTE — Progress Notes (Signed)
Chief Complaint  Patient presents with  . New Patient (Initial Visit)  . Gout    Subjective: Patient is a 53 y.o. male here for gout flare. Due to outbreak, we are interacting via web portal for an electronic face-to-face visit. I verified patient's ID using 2 identifiers.   Gout flare over past 4 d. No diet changes. Started on colchicine that has not been helpful. Does well w pred. L 1st MTP affected that is swollen and red.  ROS: MSK: +toe pain   Past Medical History:  Diagnosis Date  . History of gout   . Hypertension   . MI (myocardial infarction) (HCC) 2004  . Sleep apnea    "don't wear mask anymore"  . Stroke [facility]) 05/27/2014   "right side still weaker" (05/31/2014)    Objective: General: Awake, appears stated age Lungs: No accessory muscle use Psych: Age appropriate judgment and insight, normal affect and mood  Assessment and Plan: Acute gout involving toe of left foot, unspecified cause - Plan: predniSONE (DELTASONE) 20 MG tablet, Uric acid  Orders as above. Will give gout diet info. Ck Urate. Will likely start allopurinol given price of colchicine.  F/u in 1 mo pending lab results.  The patient voiced understanding and agreement to the plan.  Jilda Roche [place], DO 11/22/18  9:46 AM

## 2018-11-23 ENCOUNTER — Other Ambulatory Visit (INDEPENDENT_AMBULATORY_CARE_PROVIDER_SITE_OTHER): Payer: 59

## 2018-11-23 ENCOUNTER — Other Ambulatory Visit: Payer: Self-pay

## 2018-11-23 ENCOUNTER — Telehealth: Payer: Self-pay | Admitting: Family Medicine

## 2018-11-23 DIAGNOSIS — M109 Gout, unspecified: Secondary | ICD-10-CM

## 2018-11-23 LAB — URIC ACID: Uric Acid, Serum: 6.9 mg/dL (ref 4.0–7.8)

## 2018-11-23 MED ORDER — TRAMADOL HCL 50 MG PO TABS: 50.0000 mg | ORAL_TABLET | Freq: Three times a day (TID) | ORAL | 0 refills | Status: DC | PRN

## 2018-11-23 MED ORDER — PREDNISONE 20 MG PO TABS: 40.0000 mg | ORAL_TABLET | Freq: Every day | ORAL | 1 refills | Status: AC

## 2018-11-23 NOTE — Addendum Note (Signed)
Addended by: Radene Gunning on: 11/23/2018 02:39 PM   Modules accepted: Orders

## 2018-11-23 NOTE — Telephone Encounter (Signed)
He stated he got the prednisone yesterday and has not worked at all.

## 2018-11-23 NOTE — Addendum Note (Signed)
Addended by: Scharlene Gloss B on: 11/23/2018 02:47 PM   Modules accepted: Orders

## 2018-11-23 NOTE — Telephone Encounter (Signed)
Patient informed of PCP instructions.////put in referral to podiatrist.

## 2018-11-23 NOTE — Telephone Encounter (Signed)
Medicine for pain sent. I reviewed his uric acid level. It isn't high enough for me to blame gout for these recurrent attacks, especially with how atypical they would be for a true gout attack. I would like him to see a foot specialist if he is OK with it for further management. Ty.

## 2018-11-23 NOTE — Telephone Encounter (Signed)
Pt came in office stating had a VOV yesterday with provider and that provider was supposed to send pain medication for gout issue, pt went to pharmacy and no pain medication was sent. [facility] [address] Please advise.

## 2018-11-23 NOTE — Telephone Encounter (Signed)
I sent prednisone for the foot yesterday to that very location. Will send again.

## 2018-12-02 ENCOUNTER — Ambulatory Visit (INDEPENDENT_AMBULATORY_CARE_PROVIDER_SITE_OTHER): Payer: 59

## 2018-12-02 ENCOUNTER — Ambulatory Visit (INDEPENDENT_AMBULATORY_CARE_PROVIDER_SITE_OTHER): Payer: 59 | Admitting: Podiatry

## 2018-12-02 ENCOUNTER — Encounter: Payer: Self-pay | Admitting: Podiatry

## 2018-12-02 ENCOUNTER — Other Ambulatory Visit: Payer: Self-pay | Admitting: Podiatry

## 2018-12-02 ENCOUNTER — Other Ambulatory Visit: Payer: Self-pay

## 2018-12-02 VITALS — BP 168/95 | HR 56 | Temp 97.2°F | Resp 16

## 2018-12-02 DIAGNOSIS — M79675 Pain in left toe(s): Secondary | ICD-10-CM

## 2018-12-02 DIAGNOSIS — M2011 Hallux valgus (acquired), right foot: Secondary | ICD-10-CM | POA: Diagnosis not present

## 2018-12-02 DIAGNOSIS — M2012 Hallux valgus (acquired), left foot: Secondary | ICD-10-CM

## 2018-12-02 DIAGNOSIS — M205X2 Other deformities of toe(s) (acquired), left foot: Secondary | ICD-10-CM

## 2018-12-02 NOTE — Progress Notes (Signed)
   Subjective:    Patient ID: Brian Velez, male    DOB: 01/31/1966, 53 y.o.   MRN: [id number]  HPI    Review of Systems  All other systems reviewed and are negative.      Objective:   Physical Exam        Assessment & Plan:

## 2018-12-02 NOTE — Progress Notes (Signed)
Subjective:   Patient ID: Brian Velez, male   DOB: 53 y.o.   MRN: [id number]   HPI Patient presents stating he has had chronic bunion formation left and is been going on for years and also he has had diagnosis of gout that at times seems to flareup but the bone hurts all the time and he is tried wider shoes soaks and oral anti-inflammatory along with colchicine.  He is referred here for surgical intervention and does not smoke and likes to be active   Review of Systems  All other systems reviewed and are negative.       Objective:  Physical Exam Vitals signs and nursing note reviewed.  Constitutional:      Appearance: He is well-developed.  Pulmonary:     Effort: Pulmonary effort is normal.  Musculoskeletal: Normal range of motion.  Skin:    General: Skin is warm.  Neurological:     Mental Status: He is alert.     Neurovascular status intact muscle strength is found to be adequate range of motion within normal limits.  Patient is found to have hyperostosis medial aspect first metatarsal head left that is red and painful when palpated and does have elongated metatarsal.  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  This is why patient is here and he wants to do consent form today and I allowed him to review consent form going over all possible complications and the fact there is no guarantee this will solve his problem and that he also may have gout is complicating factor.  Patient is willing to accept risk signed consent form and is scheduled for outpatient surgery understanding total recovery will take 6 months to 1  year.  Patient had air fracture walker dispensed with all instructions on usage  X-rays indicate there is increase in the first metatarsal second metatarsal intermetatarsal angle bilateral with inflammation around first metatarsal head left

## 2018-12-02 NOTE — Patient Instructions (Addendum)
Gout  Gout is painful swelling of your joints. Gout is a type of arthritis. It is caused by having too much uric acid in your body. Uric acid is a chemical that is made when your body breaks down substances called purines. If your body has too much uric acid, sharp crystals can form and build up in your joints. This causes pain and swelling. Gout attacks can happen quickly and be very painful (acute gout). Over time, the attacks can affect more joints and happen more often (chronic gout). What are the causes?  Too much uric acid in your blood. This can happen because: ? Your kidneys do not remove enough uric acid from your blood. ? Your body makes too much uric acid. ? You eat too many foods that are high in purines. These foods include organ meats, some seafood, and beer.  Trauma or stress. What increases the risk?  Having a family history of gout.  Being male and middle-aged.  Being male and having gone through menopause.  Being very overweight (obese).  Drinking alcohol, especially beer.  Not having enough water in the body (being dehydrated).  Losing weight too quickly.  Having an organ transplant.  Having lead poisoning.  Taking certain medicines.  Having kidney disease.  Having a skin condition called psoriasis. What are the signs or symptoms? An attack of acute gout usually happens in just one joint. The most common place is the big toe. Attacks often start at night. Other joints that may be affected include joints of the feet, ankle, knee, fingers, wrist, or elbow. Symptoms of an attack may include:  Very bad pain.  Warmth.  Swelling.  Stiffness.  Shiny, red, or purple skin.  Tenderness. The affected joint may be very painful to touch.  Chills and fever. Chronic gout may cause symptoms more often. More joints may be involved. You may also have white or yellow lumps (tophi) on your hands or feet or in other areas near your joints. How is this  treated?  Treatment for this condition has two phases: treating an acute attack and preventing future attacks.  Acute gout treatment may include: ? NSAIDs. ? Steroids. These are taken by mouth or injected into a joint. ? Colchicine. This medicine relieves pain and swelling. It can be given by mouth or through an IV tube.  Preventive treatment may include: ? Taking small doses of NSAIDs or colchicine daily. ? Using a medicine that reduces uric acid levels in your blood. ? Making changes to your diet. You may need to see a food expert (dietitian) about what to eat and drink to prevent gout. Follow these instructions at home: During a gout attack   If told, put ice on the painful area: ? Put ice in a plastic bag. ? Place a towel between your skin and the bag. ? Leave the ice on for 20 minutes, 2-3 times a day.  Raise (elevate) the painful joint above the level of your heart as often as you can.  Rest the joint as much as possible. If the joint is in your leg, you may be given crutches.  Follow instructions from your doctor about what you cannot eat or drink. Avoiding future gout attacks  Eat a low-purine diet. Avoid foods and drinks such as: ? Liver. ? Kidney. ? Anchovies. ? Asparagus. ? Herring. ? Mushrooms. ? Mussels. ? Beer.  Stay at a healthy weight. If you want to lose weight, talk with your doctor. Do not lose weight   too fast.  Start or continue an exercise plan as told by your doctor. Eating and drinking  Drink enough fluids to keep your pee (urine) pale yellow.  If you drink alcohol: ? Limit how much you use to:  0-1 drink a day for women.  0-2 drinks a day for men. ? Be aware of how much alcohol is in your drink. In the [place] one drink equals one 12 oz bottle of beer (355 mL), one 5 oz glass of wine (148 mL), or one 1 oz glass of hard liquor (44 mL). General instructions  Take over-the-counter and prescription medicines only as told by your doctor.  Do  not drive or use heavy machinery while taking prescription pain medicine.  Return to your normal activities as told by your doctor. Ask your doctor what activities are safe for you.  Keep all follow-up visits as told by your doctor. This is important. Contact a doctor if:  You have another gout attack.  You still have symptoms of a gout attack after 10 days of treatment.  You have problems (side effects) because of your medicines.  You have chills or a fever.  You have burning pain when you pee (urinate).  You have pain in your lower back or belly. Get help right away if:  You have very bad pain.  Your pain cannot be controlled.  You cannot pee. Summary  Gout is painful swelling of the joints.  The most common site of pain is the big toe, but it can affect other joints.  Medicines and avoiding some foods can help to prevent and treat gout attacks. This information is not intended to replace advice given to you by your health care provider. Make sure you discuss any questions you have with your health care provider. Document Released: 05/05/2008 Document Revised: 02/16/2018 Document Reviewed: 02/16/2018 Elsevier Interactive Patient Education  2019 [employer]   Bunion  A bunion is a bump on the base of the big toe that forms when the bones of the big toe joint move out of position. Bunions may be small at first, but they often get larger over time. They can make walking painful. What are the causes? A bunion may be caused by:  Wearing narrow or pointed shoes that force the big toe to press against the other toes.  Abnormal foot development that causes the foot to roll inward (pronate).  Changes in the foot that are caused by certain diseases, such as rheumatoid arthritis or polio.  A foot injury. What increases the risk? The following factors may make you more likely to develop this condition:  Wearing shoes that squeeze the toes together.  Having certain  diseases, such as: ? Rheumatoid arthritis. ? Polio. ? Cerebral palsy.  Having family members who have bunions.  Being born with a foot deformity, such as flat feet or low arches.  Doing activities that put a lot of pressure on the feet, such as ballet dancing. What are the signs or symptoms? The main symptom of a bunion is a noticeable bump on the big toe. Other symptoms may include:  Pain.  Swelling around the big toe.  Redness and inflammation.  Thick or hardened skin on the big toe or between the toes.  Stiffness or loss of motion in the big toe.  Trouble with walking. How is this diagnosed? A bunion may be diagnosed based on your symptoms, medical history, and activities. You may have tests, such as:  X-rays. These allow your  health care provider to check the position of the bones in your foot and look for damage to your joint. They also help your health care provider determine the severity of your bunion and the best way to treat it.  Joint aspiration. In this test, a sample of fluid is removed from the toe joint. This test may be done if you are in a lot of pain. It helps rule out diseases that cause painful swelling of the joints, such as arthritis. How is this treated? Treatment depends on the severity of your symptoms. The goal of treatment is to relieve symptoms and prevent the bunion from getting worse. Your health care provider may recommend:  Wearing shoes that have a wide toe box.  Using bunion pads to cushion the affected area.  Taping your toes together to keep them in a normal position.  Placing a device inside your shoe (orthotics) to help reduce pressure on your toe joint.  Taking medicine to ease pain, inflammation, and swelling.  Applying heat or ice to the affected area.  Doing stretching exercises.  Surgery to remove scar tissue and move the toes back into their normal position. This treatment is rare. Follow these instructions at home: Managing  pain, stiffness, and swelling   If directed, put ice on the painful area: ? Put ice in a plastic bag. ? Place a towel between your skin and the bag. ? Leave the ice on for 20 minutes, 2-3 times a day. Activity   If directed, apply heat to the affected area before you exercise. Use the heat source that your health care provider recommends, such as a moist heat pack or a heating pad. ? Place a towel between your skin and the heat source. ? Leave the heat on for 20-30 minutes. ? Remove the heat if your skin turns bright red. This is especially important if you are unable to feel pain, heat, or cold. You may have a greater risk of getting burned.  Do exercises as told by your health care provider. General instructions  Support your toe joint with proper footwear, shoe padding, or taping as told by your health care provider.  Take over-the-counter and prescription medicines only as told by your health care provider.  Keep all follow-up visits as told by your health care provider. This is important. Contact a health care provider if your symptoms:  Get worse.  Do not improve in 2 weeks. Get help right away if you have:  Severe pain and trouble with walking. Summary  A bunion is a bump on the base of the big toe that forms when the bones of the big toe joint move out of position.  Bunions can make walking painful.  Treatment depends on the severity of your symptoms.  Support your toe joint with proper footwear, shoe padding, or taping as told by your health care provider. This information is not intended to replace advice given to you by your health care provider. Make sure you discuss any questions you have with your health care provider. Document Released: 07/27/2005 Document Revised: 12/07/2017 Document Reviewed: 12/07/2017 Elsevier Interactive Patient Education  2019 [employer].    Pre-Operative Instructions  Congratulations, you have decided to take an important step  towards improving your quality of life.  You can be assured that the doctors and staff at [facility] will be with you every step of the way.  Here are some important things you should know:  1. Plan to  be at the surgery center/hospital at least 1 (one) hour prior to your scheduled time, unless otherwise directed by the surgical center/hospital staff.  You must have a responsible adult accompany you, remain during the surgery and drive you home.  Make sure you have directions to the surgical center/hospital to ensure you arrive on time. 2. If you are having surgery at [facility] or [facility], you will need a copy of your medical history and physical form from your family physician within one month prior to the date of surgery. We will give you a form for your primary physician to complete.  3. We make every effort to accommodate the date you request for surgery.  However, there are times where surgery dates or times have to be moved.  We will contact you as soon as possible if a change in schedule is required.   4. No aspirin/ibuprofen for one week before surgery.  If you are on aspirin, any non-steroidal anti-inflammatory medications (Mobic, Aleve, Ibuprofen) should not be taken seven (7) days prior to your surgery.  You make take Tylenol for pain prior to surgery.  5. Medications - If you are taking daily heart and blood pressure medications, seizure, reflux, allergy, asthma, anxiety, pain or diabetes medications, make sure you notify the surgery center/hospital before the day of surgery so they can tell you which medications you should take or avoid the day of surgery. 6. No food or drink after midnight the night before surgery unless directed otherwise by surgical center/hospital staff. 7. No alcoholic beverages 24-hours prior to surgery.  No smoking 24-hours prior or 24-hours after surgery. 8. Wear loose pants or shorts. They should be loose enough to fit over bandages, boots, and  casts. 9. Don't wear slip-on shoes. Sneakers are preferred. 10. Bring your boot with you to the surgery center/hospital.  Also bring crutches or a walker if your physician has prescribed it for you.  If you do not have this equipment, it will be provided for you after surgery. 11. If you have not been contacted by the surgery center/hospital by the day before your surgery, call to confirm the date and time of your surgery. 12. Leave-time from work may vary depending on the type of surgery you have.  Appropriate arrangements should be made prior to surgery with your employer. 13. Prescriptions will be provided immediately following surgery by your doctor.  Fill these as soon as possible after surgery and take the medication as directed. Pain medications will not be refilled on weekends and must be approved by the doctor. 14. Remove nail polish on the operative foot and avoid getting pedicures prior to surgery. 15. Wash the night before surgery.  The night before surgery wash the foot and leg well with water and the antibacterial soap provided. Be sure to pay special attention to beneath the toenails and in between the toes.  Wash for at least three (3) minutes. Rinse thoroughly with water and dry well with a towel.  Perform this wash unless told not to do so by your physician.  Enclosed: 1 Ice pack (please put in freezer the night before surgery)   1 Hibiclens skin cleaner   Pre-op instructions  If you have any questions regarding the instructions, please do not hesitate to call our office.  [place]: [address] [place], [place] [address] -- [phone]  [place]: [address], [place], [place] [address] -- [phone]  [place]: [address]  [place], [place] [address] -- [phone]  [place]:  [address], [place], [place] [address] -- [phone]  Website: [email or web address]

## 2018-12-05 ENCOUNTER — Telehealth: Payer: Self-pay | Admitting: *Deleted

## 2018-12-05 NOTE — Telephone Encounter (Signed)
I called and left the patient a message that his appointment is for Tuesday, Dec 13, 2018, not Dec 15, 2018.  I asked him to give me a call if he has any questions.

## 2018-12-06 ENCOUNTER — Telehealth: Payer: Self-pay | Admitting: *Deleted

## 2018-12-06 NOTE — Telephone Encounter (Signed)
DOS 12/13/2018 Serafina Royals BI-PLANAR LEFT FOOT - [id number]  [employer] Plan Date Aug 10, 2018  Co-Payment - Surgical Network Not Applicable Employee and Children Auth Not Required  Surgeon  Outpatient Surgeon   Co-Insurance - Surgical In Network Employee and Children  All Other In-Network Providers  Surgeon,COINS APPLIES TO OUT OF POCKET  Outpatient Surgeon,COINS APPLIES TO OUT OF POCKET  20 %  Deductible - Health Benefit Plan Coverage  In Network Family  Med Dent,All Other In-Network Providers,DED INCLUDED IN OOP  Med Dent  $6,750.00 Calendar Year  [phone] Year to Date  $3,979.67 Remaining   Network Not Applicable Individual  $0.00 Calendar Year  $0.00 Year to Date  $0.00 Remaining

## 2018-12-13 ENCOUNTER — Encounter: Payer: Self-pay | Admitting: Podiatry

## 2018-12-13 DIAGNOSIS — M2022 Hallux rigidus, left foot: Secondary | ICD-10-CM

## 2018-12-14 ENCOUNTER — Other Ambulatory Visit: Payer: Self-pay | Admitting: *Deleted

## 2018-12-14 ENCOUNTER — Telehealth: Payer: Self-pay | Admitting: *Deleted

## 2018-12-14 ENCOUNTER — Other Ambulatory Visit: Payer: Self-pay | Admitting: Podiatry

## 2018-12-14 MED ORDER — HYDROMORPHONE HCL 4 MG PO TABS: 4.0000 mg | ORAL_TABLET | ORAL | 0 refills | Status: DC | PRN

## 2018-12-14 NOTE — Telephone Encounter (Signed)
Per Dr Charlsie Merles called in Dilaudid 4 mg to the pharmacy and I called and spoke with patient to go and pick the RX up today. Misty Stanley

## 2018-12-14 NOTE — Telephone Encounter (Signed)
I informed Dr. Charlsie Merles of pt's call and my instructions. Dr. Charlsie Merles agreed to the instructions given to pt and states if pt continues to have pain after performing the instructions, to order Dilaudid.

## 2018-12-14 NOTE — Telephone Encounter (Signed)
Called and spoke with the patient and the pain is about a 10 and patient is nauseated and I did take the ace bandage off and put the ice pack under my knee and that is not even helping and patient stated that the pain medicine is not strong enough and stated to take ibuprofen in between and patient stated that he has already done that and not helping either and I stated that I would let Dr Charlsie Merles now and would call patient back. Misty Stanley

## 2018-12-14 NOTE — Telephone Encounter (Signed)
Pt states had surgery with Dr. Charlsie Merles yesterday, and he is in severe pain and has not slept. I asked pt to describe his pain and he stated sharp with throbbing. I told pt some of the pain may be relieved by removing the surgery boot, open-ended sock and the ace wrap, then elevate the foot for 15 minutes, but if pain increased elevated, then dangle for 15 minutes, after 15 minutes either way, place foot level with hip, and rewrap the ace beginning at the toes and wrap loosely single layer up the leg, reapply the sock and boot. I told pt not to weight bear or have the foot below his heart for more than 15 minutes/hour and ice 15 minutes/hour, and if he was able to take OTC Ibuprofen take 800mg  3 times a day between the dosing of the oxycodone. I asked pt the name of the pain medication prescribed. Pt states he has oxycodone 10/325mg . I told pt to take the pain medication as directed to maintain a constant level of medication in his system. Pt states understanding.

## 2018-12-15 ENCOUNTER — Encounter: Payer: Self-pay | Admitting: Podiatry

## 2018-12-18 ENCOUNTER — Other Ambulatory Visit: Payer: Self-pay | Admitting: Family Medicine

## 2018-12-18 DIAGNOSIS — G4700 Insomnia, unspecified: Secondary | ICD-10-CM

## 2018-12-19 ENCOUNTER — Other Ambulatory Visit: Payer: Self-pay | Admitting: Family Medicine

## 2018-12-19 ENCOUNTER — Encounter: Payer: Self-pay | Admitting: Podiatry

## 2018-12-19 ENCOUNTER — Ambulatory Visit (INDEPENDENT_AMBULATORY_CARE_PROVIDER_SITE_OTHER): Payer: 59

## 2018-12-19 ENCOUNTER — Other Ambulatory Visit: Payer: Self-pay

## 2018-12-19 ENCOUNTER — Ambulatory Visit (INDEPENDENT_AMBULATORY_CARE_PROVIDER_SITE_OTHER): Payer: 59 | Admitting: Podiatry

## 2018-12-19 VITALS — Temp 97.2°F

## 2018-12-19 DIAGNOSIS — M205X2 Other deformities of toe(s) (acquired), left foot: Secondary | ICD-10-CM

## 2018-12-19 DIAGNOSIS — Z09 Encounter for follow-up examination after completed treatment for conditions other than malignant neoplasm: Secondary | ICD-10-CM

## 2018-12-19 DIAGNOSIS — G4700 Insomnia, unspecified: Secondary | ICD-10-CM

## 2018-12-21 NOTE — Progress Notes (Signed)
Subjective:   Patient ID: Brian Velez, male   DOB: 53 y.o.   MRN: [id number]   HPI Patient presents stating that he is doing very well with minimal discomfort and so far is very pleased with pain the first couple days but overall doing well   ROS      Objective:  Physical Exam  Neurovascular status intact with patient's left foot healing well wound edges well coapted good range of motion currently with negative Homans sign noted.     Assessment:  Doing well post osteotomy first metatarsal left     Plan:  H&P x-rays reviewed and allowed patient to continue with immobilization elevation compression and begin range of motion exercises.  Reappoint 3 weeks or earlier if needed  X-rays indicate osteotomies healing well fixation in place joint congruence

## 2018-12-26 ENCOUNTER — Other Ambulatory Visit: Payer: Self-pay

## 2018-12-26 ENCOUNTER — Encounter: Payer: Self-pay | Admitting: Podiatry

## 2018-12-26 ENCOUNTER — Ambulatory Visit: Payer: 59

## 2018-12-26 ENCOUNTER — Ambulatory Visit (INDEPENDENT_AMBULATORY_CARE_PROVIDER_SITE_OTHER): Payer: 59 | Admitting: Podiatry

## 2018-12-26 VITALS — Temp 97.3°F

## 2018-12-26 DIAGNOSIS — M205X2 Other deformities of toe(s) (acquired), left foot: Secondary | ICD-10-CM

## 2018-12-26 DIAGNOSIS — Z09 Encounter for follow-up examination after completed treatment for conditions other than malignant neoplasm: Secondary | ICD-10-CM

## 2018-12-28 NOTE — Progress Notes (Signed)
Subjective:   Patient ID: Brian Velez, male   DOB: 53 y.o.   MRN: [id number]   HPI Patient states I am improving with minimal discomfort noted currently   ROS      Objective:  Physical Exam  Neurovascular status intact negative Homans sign with patient's right foot healing well wound edges well coapted and good range of motion with no crepitus currently     Assessment:  Doing well post hallux limitus repair     Plan:  Instructed on continued range of motion exercises and applied sterile dressing to help with compression and instructed on continued elevation and reappoint in the next several weeks or earlier if needed signed visit

## 2018-12-29 NOTE — Telephone Encounter (Signed)
error 

## 2019-01-09 ENCOUNTER — Other Ambulatory Visit: Payer: Self-pay

## 2019-01-09 ENCOUNTER — Ambulatory Visit (INDEPENDENT_AMBULATORY_CARE_PROVIDER_SITE_OTHER): Payer: 59 | Admitting: Podiatry

## 2019-01-09 ENCOUNTER — Encounter: Payer: Self-pay | Admitting: Podiatry

## 2019-01-09 ENCOUNTER — Ambulatory Visit (INDEPENDENT_AMBULATORY_CARE_PROVIDER_SITE_OTHER): Payer: 59

## 2019-01-09 VITALS — Temp 97.9°F

## 2019-01-09 DIAGNOSIS — M2012 Hallux valgus (acquired), left foot: Secondary | ICD-10-CM | POA: Diagnosis not present

## 2019-01-09 NOTE — Progress Notes (Signed)
Subjective:   Patient ID: Brian Velez, male   DOB: 53 y.o.   MRN: [id number]   HPI Patient states she is doing very well with surgery with minimal discomfort and starting to wear normal type shoes   ROS      Objective:  Physical Exam  Neurovascular status intact with patient's left foot healing well wound edges well coapted hallux in rectus position     Assessment:  Doing well post HAV hallux limitus surgery left     Plan:  Explained on the importance of compression elevation range of motion and patient will be seen back 6 weeks or earlier if needed  X-rays indicate fixation in place osteotomy healing well

## 2019-01-17 ENCOUNTER — Other Ambulatory Visit: Payer: Self-pay | Admitting: Family Medicine

## 2019-01-17 DIAGNOSIS — I2510 Atherosclerotic heart disease of native coronary artery without angina pectoris: Secondary | ICD-10-CM

## 2019-01-17 MED ORDER — ATORVASTATIN CALCIUM 40 MG PO TABS: 40.0000 mg | ORAL_TABLET | Freq: Every day | ORAL | 1 refills | Status: DC

## 2019-02-20 NOTE — Progress Notes (Signed)
This encounter was created in error - please disregard.

## 2020-01-10 ENCOUNTER — Other Ambulatory Visit: Payer: Self-pay

## 2020-01-10 ENCOUNTER — Ambulatory Visit (INDEPENDENT_AMBULATORY_CARE_PROVIDER_SITE_OTHER): Payer: No Typology Code available for payment source | Admitting: Family Medicine

## 2020-01-10 ENCOUNTER — Encounter: Payer: Self-pay | Admitting: Family Medicine

## 2020-01-10 VITALS — BP 134/76 | HR 74 | Temp 96.1°F | Ht 69.0 in | Wt 193.4 lb

## 2020-01-10 DIAGNOSIS — Z125 Encounter for screening for malignant neoplasm of prostate: Secondary | ICD-10-CM | POA: Diagnosis not present

## 2020-01-10 DIAGNOSIS — Z0000 Encounter for general adult medical examination without abnormal findings: Secondary | ICD-10-CM | POA: Diagnosis not present

## 2020-01-10 DIAGNOSIS — G4700 Insomnia, unspecified: Secondary | ICD-10-CM | POA: Diagnosis not present

## 2020-01-10 LAB — CBC
HCT: 39.5 % (ref 39.0–52.0)
Hemoglobin: 13.4 g/dL (ref 13.0–17.0)
MCHC: 34 g/dL (ref 30.0–36.0)
MCV: 85.2 fl (ref 78.0–100.0)
Platelets: 252 10*3/uL (ref 150.0–400.0)
RBC: 4.64 Mil/uL (ref 4.22–5.81)
RDW: 13.6 % (ref 11.5–15.5)
WBC: 6.5 10*3/uL (ref 4.0–10.5)

## 2020-01-10 LAB — COMPREHENSIVE METABOLIC PANEL
ALT: 11 U/L (ref 0–53)
AST: 16 U/L (ref 0–37)
Albumin: 3.9 g/dL (ref 3.5–5.2)
Alkaline Phosphatase: 79 U/L (ref 39–117)
BUN: 11 mg/dL (ref 6–23)
CO2: 30 mEq/L (ref 19–32)
Calcium: 8.7 mg/dL (ref 8.4–10.5)
Chloride: 104 mEq/L (ref 96–112)
Creatinine, Ser: 0.98 mg/dL (ref 0.40–1.50)
GFR: 96.72 mL/min (ref >60.00)
Glucose, Bld: 92 mg/dL (ref 70–99)
Potassium: 4.1 mEq/L (ref 3.5–5.1)
Sodium: 139 mEq/L (ref 135–145)
Total Bilirubin: 1.4 mg/dL — ABNORMAL HIGH (ref 0.2–1.2)
Total Protein: 6 g/dL (ref 6.0–8.3)

## 2020-01-10 LAB — LIPID PANEL
Cholesterol: 121 mg/dL (ref 0–200)
HDL: 62.4 mg/dL (ref >39.00)
LDL Cholesterol: 50 mg/dL (ref 0–99)
NonHDL: 58.68
Total CHOL/HDL Ratio: 2
Triglycerides: 45 mg/dL (ref 0.0–149.0)
VLDL: 9 mg/dL (ref 0.0–40.0)

## 2020-01-10 LAB — PSA: PSA: 2.21 ng/mL (ref 0.10–4.00)

## 2020-01-10 LAB — HEMOGLOBIN A1C: Hgb A1c MFr Bld: 6.2 % (ref 4.6–6.5)

## 2020-01-10 MED ORDER — TRAZODONE HCL 50 MG PO TABS: 25.0000 mg | ORAL_TABLET | Freq: Every evening | ORAL | 2 refills | Status: DC | PRN

## 2020-01-10 MED ORDER — AMLODIPINE BESYLATE 5 MG PO TABS: 5.0000 mg | ORAL_TABLET | Freq: Every day | ORAL | 3 refills | Status: DC

## 2020-01-10 NOTE — Progress Notes (Signed)
Chief Complaint  Patient presents with   Annual Exam    Well Male Brian Velez is here for a complete physical.   His last physical was >1 year ago.  Current diet: in general, a "pretty good" diet.  Current exercise: none Weight trend: stable Daytime fatigue? No. Seat belt? Yes.    Health maintenance Shingrix- No Colonoscopy- Yes Tetanus- Yes HIV- Yes   Past Medical History:  Diagnosis Date   History of gout    Hypertension    MI (myocardial infarction) ([place]) 2004   Sleep apnea    "don't wear mask anymore"   Stroke ([place]) 05/27/2014   "right side still weaker" (05/31/2014)      Past Surgical History:  Procedure Laterality Date   ANKLE FRACTURE SURGERY Left 2000's   CARDIAC CATHETERIZATION  2004; 2014   HAND SURGERY     left palm and fingertip injury   ROUX-EN-Y GASTRIC BYPASS  2013   SKIN BIOPSY      Medications  No current outpatient medications on file prior to visit.   No current facility-administered medications on file prior to visit.     Allergies No Known Allergies  Family History Family History  Problem Relation Age of Onset   Dementia Mother        died of dementia   Breast cancer Mother    Hypertension Mother    Heart disease Mother    Cancer Mother        breast   Diabetes Mother    CAD Father    Stroke Father    Prostate cancer Father 69   Cancer Father 81       colon   Hypertension Father    Diabetes Father    Heart disease Father    Colon cancer Father    Diabetes type II Sister    Hypertension Sister    Diabetes Sister    Diabetes type II Brother    Hypertension Brother    Diabetes Brother    Colon polyps Brother    Diabetes type II Sister    Hypertension Sister    Diabetes Sister    Diabetes type II Sister    Hypertension Sister    Stroke Brother    Colon cancer Brother    Colon cancer Paternal Uncle    Colon cancer Paternal Uncle    Esophageal cancer Neg Hx    Rectal  cancer Neg Hx    Stomach cancer Neg Hx     Review of Systems: Constitutional:  no fevers Eye:  no recent significant change in vision Ear/Nose/Mouth/Throat:  Ears:  no hearing loss Nose/Mouth/Throat:  no complaints of nasal congestion, no sore throat Cardiovascular:  no chest pain, no palpitations Respiratory:  no cough and no shortness of breath Gastrointestinal:  no abdominal pain, no change in bowel habits GU:  Male: negative for dysuria, frequency, and incontinence and negative for prostate symptoms Musculoskeletal/Extremities:  no pain, redness, or swelling of the joints Integumentary (Skin/Breast):  no abnormal skin lesions reported Neurologic:  no headaches Endocrine: No unexpected weight changes Hematologic/Lymphatic:  no abnormal bleeding  Exam BP 134/76 (BP Location: Left Arm, Patient Position: Sitting, Cuff Size: Normal)    Pulse 74    Temp (!) 96.1 F (35.6 C) (Temporal)    Ht 5\' 9"  (1.753 m)    Wt 193 lb 6 oz (87.7 kg)    SpO2 97%    BMI 28.56 kg/m  General:  well developed, well nourished, in no  apparent distress Skin:  no significant moles, warts, or growths Head:  no masses, lesions, or tenderness Eyes:  pupils equal and round, sclera anicteric without injection Ears:  canals without lesions, TMs shiny without retraction, no obvious effusion, no erythema Nose:  nares patent, septum midline, mucosa normal Throat/Pharynx:  lips and gingiva without lesion; tongue and uvula midline; non-inflamed pharynx; no exudates or postnasal drainage Neck: neck supple without adenopathy, thyromegaly, or masses Cardiac: RRR, no bruits, no LE edema Lungs:  clear to auscultation, breath sounds equal bilaterally, no respiratory distress Rectal: Deferred Musculoskeletal:  symmetrical muscle groups noted without atrophy or deformity Neuro:  gait normal; deep tendon reflexes normal and symmetric Psych: well oriented with normal range of affect and appropriate  judgment/insight  Assessment and Plan  Well adult exam - Plan: Hemoglobin A1c, Lipid panel, CBC, Comprehensive metabolic panel  Insomnia, unspecified type - Plan: traZODone (DESYREL) 50 MG tablet  Screening for prostate cancer - Plan: PSA   Well 54 y.o. male. Counseled on diet and exercise. Counseled on risks and benefits of prostate cancer screening with PSA. The patient agrees to undergo testing. Reorder meds. Will change Lipitor to pravastatin as it is on the $4 list if cholesterol still elevated.  Immunizations, labs, and further orders as above. Follow up in 6 mo or prn. If BP not controlled, I want to see him in 1 mo.  The patient voiced understanding and agreement to the plan.  Jilda Roche [place], DO 01/10/20 10:21 AM

## 2020-01-10 NOTE — Patient Instructions (Addendum)
Give [place] 2-3 business days to get the results of your labs back.   Keep the diet clean and stay active.   I want your blood pressure <140/90 routinely. Have her check back to back waiting 1 minute between readings before service. If not at goal, I want to see you in 1 month.   The new Shingrix vaccine (for shingles) is a 2 shot series. It can make people feel low energy, achy and almost like they have the flu for 48 hours after injection. Please plan accordingly when deciding on when to get this shot. Call our office for a nurse visit appointment to get this. The second shot of the series is less severe regarding the side effects, but it still lasts 48 hours. Wait at least 2 weeks from your final covid vaccine.  Let us know if you need anything.

## 2020-01-31 ENCOUNTER — Telehealth: Payer: Self-pay | Admitting: *Deleted

## 2020-01-31 NOTE — Telephone Encounter (Signed)
Looks like patient did not go to ED and sent a [employer].  Chief Complaint CHEST PAIN (>=21 years) - pain, pressure, heaviness or tightness Reason for Call Symptomatic / Request for Health Information Initial Comment Caller states her husband has been experiencing high BP and chest pains. Translation No Nurse Assessment Nurse: Yetta Barre, RN, Miranda Date/Time (Eastern Time): 01/31/2020 2:32:43 PM Confirm and document reason for call. If symptomatic, describe symptoms. ---Caller states her husband has been c/o chest pain for 1 week. His BP has also been high. BP 1 hr ago, 174/122. He has been CP since this morning.   Disp. Time Lamount Cohen Time) Disposition Final User 01/31/2020 2:31:55 PM Send to Urgent Mammie Russian 01/31/2020 2:36:50 PM Go to ED Now Yes Yetta Barre, RN, Tamera Punt

## 2020-02-01 NOTE — Telephone Encounter (Signed)
He needs to be seen by someone, sooner than later.

## 2020-02-01 NOTE — Telephone Encounter (Signed)
Spoke with patient he stated his BP has been extremely high and he had chest pains on Monday.. BP (174/138) ,& this Am(6/24)  155/140 and he is having chest tightness but he is out of town and wont be back in town Friday. He states this has been going on for the past 2 weeks. He has an appt with you on 6/28 ... and he is taking amlodipine BID started today.

## 2020-02-05 ENCOUNTER — Ambulatory Visit (INDEPENDENT_AMBULATORY_CARE_PROVIDER_SITE_OTHER): Payer: No Typology Code available for payment source | Admitting: Family Medicine

## 2020-02-05 ENCOUNTER — Other Ambulatory Visit: Payer: Self-pay

## 2020-02-05 ENCOUNTER — Encounter: Payer: Self-pay | Admitting: Family Medicine

## 2020-02-05 VITALS — BP 134/72 | HR 61 | Temp 96.1°F | Ht 69.0 in | Wt 192.1 lb

## 2020-02-05 DIAGNOSIS — I25119 Atherosclerotic heart disease of native coronary artery with unspecified angina pectoris: Secondary | ICD-10-CM | POA: Diagnosis not present

## 2020-02-05 MED ORDER — AMLODIPINE BESYLATE 5 MG PO TABS: 5.0000 mg | ORAL_TABLET | Freq: Every day | ORAL | 3 refills | Status: DC

## 2020-02-05 MED ORDER — ASPIRIN EC 81 MG PO TBEC: 81.0000 mg | DELAYED_RELEASE_TABLET | Freq: Every day | ORAL | 11 refills | Status: DC

## 2020-02-05 MED ORDER — ATORVASTATIN CALCIUM 40 MG PO TABS: 40.0000 mg | ORAL_TABLET | Freq: Every day | ORAL | 2 refills | Status: DC

## 2020-02-05 MED ORDER — CARVEDILOL 12.5 MG PO TABS: 12.5000 mg | ORAL_TABLET | Freq: Two times a day (BID) | ORAL | 2 refills | Status: DC

## 2020-02-05 MED ORDER — TRAZODONE HCL 100 MG PO TABS: 100.0000 mg | ORAL_TABLET | Freq: Every day | ORAL | 3 refills | Status: DC

## 2020-02-05 NOTE — Patient Instructions (Signed)
If you do not hear anything about your referral in the next 1-2 days, call our office and ask for an update.  Monitor your blood pressures with an automated cuff.  Listen to your body. I would avoid strenuous exercise until you see the specialist.  Let me know if there are cost issues with the medication.  Let us know if you need anything.

## 2020-02-05 NOTE — Progress Notes (Signed)
Chief Complaint  Patient presents with   Hypertension    Subjective Brian Velez is a 54 y.o. male who presents for hypertension follow up. He does monitor home blood pressures. Blood pressures ranging from 140-150's/100's on average. He is compliant with medications- Norvasc 10 mg/d. Patient has these side effects of medication: none He is adhering to a healthy diet overall. Current exercise: none lately  Duration of issue: 3 weeks Quality: sharp, aching if it is continuous Palliation: rest Provocation: physical exertion, sometimes randomly when he is sitting Severity: 6/10 Radiation: Some L arm pain in certain instances Duration of chest pain: 45-60 minutes Associated symptoms: L arm pain, sob Cardiac history: CAD, stroke Family heart history: mom, dad, siblings Smoker? No    Past Medical History:  Diagnosis Date   History of gout    Hypertension    MI (myocardial infarction) (HCC) 2004   Sleep apnea    "don't wear mask anymore"   Stroke (HCC) 05/27/2014   "right side still weaker" (05/31/2014)    Exam BP 134/72 (BP Location: Left Arm, Patient Position: Sitting, Cuff Size: Normal)    Pulse 61    Temp (!) 96.1 F (35.6 C) (Temporal)    Ht 5\' 9"  (1.753 m)    Wt 192 lb 2 oz (87.1 kg)    SpO2 97%    BMI 28.37 kg/m  General:  well developed, well nourished, in no apparent distress Heart: RRR, no bruits, no LE edema Lungs: clear to auscultation, no accessory muscle use MSK: Chest pain is reproducible to palpation just L of midline around T4 dermatome Psych: well oriented with normal range of affect and appropriate judgment/insight  Coronary artery disease involving native heart with angina pectoris, unspecified vessel or lesion type (HCC) - Plan: carvedilol (COREG) 12.5 MG tablet, atorvastatin (LIPITOR) 40 MG tablet, aspirin EC 81 MG tablet, EKG 12-Lead, Ambulatory referral to Cardiology  EKG show Sinus bradycardia, nml axis, RBBB w/o other interval abn, no  signs of ischemia or T wave changes, decent R wave progression. Go back to Norvasc 5mg /d. Restart ASA, statin, Coreg. He was off of these due to loss of insurance. Refer cards urgent. Some of his s/s's sound cardiac and some do not. With his hx, would like him seen sooner than later.  F/u in 1 week unless he gets in w cards soon. The patient voiced understanding and agreement to the plan.  [place], DO 02/05/20  11:00 AM

## 2020-02-08 ENCOUNTER — Telehealth: Payer: Self-pay | Admitting: Cardiology

## 2020-02-08 NOTE — Telephone Encounter (Signed)
Called patient moved his appointment up to next week. He verbally understood. No further questions.

## 2020-02-08 NOTE — Telephone Encounter (Signed)
Follow up   Pt is returning call    

## 2020-02-08 NOTE — Telephone Encounter (Signed)
Left message for patient to return call.

## 2020-02-08 NOTE — Telephone Encounter (Signed)
Patient is scheduled 04/08/2020 with Dr. Bing Matter. He would like to know if he can be seen any sooner since his referral is urgent. I did not see anything sooner with Dr. Bing Matter.

## 2020-02-09 ENCOUNTER — Ambulatory Visit: Payer: No Typology Code available for payment source | Admitting: Family Medicine

## 2020-02-13 ENCOUNTER — Ambulatory Visit (INDEPENDENT_AMBULATORY_CARE_PROVIDER_SITE_OTHER): Payer: No Typology Code available for payment source | Admitting: Cardiology

## 2020-02-13 ENCOUNTER — Encounter: Payer: Self-pay | Admitting: Cardiology

## 2020-02-13 ENCOUNTER — Ambulatory Visit: Payer: No Typology Code available for payment source | Admitting: Cardiology

## 2020-02-13 ENCOUNTER — Other Ambulatory Visit: Payer: Self-pay

## 2020-02-13 VITALS — BP 130/86 | HR 56 | Ht 69.0 in | Wt 193.4 lb

## 2020-02-13 DIAGNOSIS — R0600 Dyspnea, unspecified: Secondary | ICD-10-CM

## 2020-02-13 DIAGNOSIS — R0609 Other forms of dyspnea: Secondary | ICD-10-CM

## 2020-02-13 DIAGNOSIS — I25119 Atherosclerotic heart disease of native coronary artery with unspecified angina pectoris: Secondary | ICD-10-CM

## 2020-02-13 DIAGNOSIS — R0789 Other chest pain: Secondary | ICD-10-CM | POA: Diagnosis not present

## 2020-02-13 DIAGNOSIS — E782 Mixed hyperlipidemia: Secondary | ICD-10-CM

## 2020-02-13 DIAGNOSIS — I10 Essential (primary) hypertension: Secondary | ICD-10-CM | POA: Diagnosis not present

## 2020-02-13 MED ORDER — ISOSORBIDE MONONITRATE ER 30 MG PO TB24
30.0000 mg | ORAL_TABLET | Freq: Every day | ORAL | 1 refills | Status: DC
Start: 2020-02-13 — End: 2021-09-30

## 2020-02-13 NOTE — Progress Notes (Signed)
Cardiology Office Note:    Date:  02/13/2020   ID:  [name], DOB December 07, 1965, MRN [id number]  PCP:  Sharlene Dory, DO  Cardiologist:  Gypsy Balsam, MD    Referring MD: Sharlene Dory*   No chief complaint on file. I am having chest pain  History of Present Illness:    Brian Velez is a 54 y.o. male with past medical history significant for questionable coronary artery disease, pontine 2004 he was in the hospital was told to have small myocardial infarction, no cardiac catheterization was done.  At that time he was morbidly obese.  He does have history of hypertension remote history of sleep apnea dyslipidemia.  About 6 years ago he did have gastric bypass surgery done.  Since that time he lost about 200 pounds now he waits less than 200 pounds.  He came to me because of atypical chest pain.  He described heavy sensation in the chest all the time.  It is very mild is not noticeable when he does think it is noticeable when he sits down in the chair.  There is no provoking or relieving factor.  However story goes much deeper.  He described to have fatigue tiredness and inability to do what he likes to do because of fatigue.  There is no swelling of lower extremities there is no palpitations he describes sometimes to have stabbing-like chest pains.  But he thinks there is a problem with his heart and then he while he would like to be seen.  Past Medical History:  Diagnosis Date  . History of gout   . Hypertension   . MI (myocardial infarction) (HCC) 2004  . Sleep apnea    "don't wear mask anymore"  . Stroke [facility]) 05/27/2014   "right side still weaker" (05/31/2014)    Past Surgical History:  Procedure Laterality Date  . ANKLE FRACTURE SURGERY Left 2000's  . CARDIAC CATHETERIZATION  2004; 2014  . HAND SURGERY     left palm and fingertip injury  . ROUX-EN-Y GASTRIC BYPASS  2013  . SKIN BIOPSY      Current Medications: Current Meds  Medication Sig  .  amLODipine (NORVASC) 5 MG tablet Take 1 tablet (5 mg total) by mouth daily.  Marland Kitchen aspirin EC 81 MG tablet Take 1 tablet (81 mg total) by mouth daily. Swallow whole.  Marland Kitchen atorvastatin (LIPITOR) 40 MG tablet Take 1 tablet (40 mg total) by mouth daily.  . carvedilol (COREG) 12.5 MG tablet Take 1 tablet (12.5 mg total) by mouth 2 (two) times daily with a meal.  . traZODone (DESYREL) 100 MG tablet Take 1 tablet (100 mg total) by mouth at bedtime.     Allergies:   Patient has no known allergies.   Social History   Socioeconomic History  . Marital status: Single    Spouse name: Not on file  . Number of children: Not on file  . Years of education: Not on file  . Highest education level: Not on file  Occupational History  . Not on file  Tobacco Use  . Smoking status: Never Smoker  . Smokeless tobacco: Never Used  Vaping Use  . Vaping Use: Never used  Substance and Sexual Activity  . Alcohol use: No  . Drug use: No  . Sexual activity: Never  Other Topics Concern  . Not on file  Social History Narrative   Brian Velez.   Divorced.   4 children, 1 grandchildren.   Exercise - moderate,  goes to the gym   10/2017.   Social Determinants of Health   Financial Resource Strain:   . Difficulty of Paying Living Expenses:   Food Insecurity:   . Worried About Programme researcher, broadcasting/film/video in the Last Year:   . Barista in the Last Year:   Transportation Needs:   . Freight forwarder (Medical):   Marland Kitchen Lack of Transportation (Non-Medical):   Physical Activity:   . Days of Exercise per Week:   . Minutes of Exercise per Session:   Stress:   . Feeling of Stress :   Social Connections:   . Frequency of Communication with Friends and Family:   . Frequency of Social Gatherings with Friends and Family:   . Attends Religious Services:   . Active Member of Clubs or Organizations:   . Attends Banker Meetings:   Marland Kitchen Marital Status:      Family History: The patient's family history includes Breast  cancer in his mother; CAD in his father; Cancer in his mother; Cancer (age of onset: 28) in his father; Colon cancer in his brother, father, paternal uncle, and paternal uncle; Colon polyps in his brother; Dementia in his mother; Diabetes in his brother, father, mother, sister, and sister; Diabetes type II in his brother, sister, sister, and sister; Heart disease in his father and mother; Hypertension in his brother, father, mother, sister, sister, and sister; Prostate cancer (age of onset: 77) in his father; Stroke in his brother and father. There is no history of Esophageal cancer, Rectal cancer, or Stomach cancer. ROS:   Please see the history of present illness.    All 14 point review of systems negative except as described per history of present illness  EKGs/Labs/Other Studies Reviewed:      Recent Labs: 01/10/2020: ALT 11; BUN 11; Creatinine, Ser 0.98; Hemoglobin 13.4; Platelets 252.0; Potassium 4.1; Sodium 139  Recent Lipid Panel    Component Value Date/Time   CHOL 121 01/10/2020 1028   CHOL 146 11/19/2017 1016   TRIG 45.0 01/10/2020 1028   HDL 62.40 01/10/2020 1028   HDL 79 11/19/2017 1016   CHOLHDL 2 01/10/2020 1028   VLDL 9.0 01/10/2020 1028   LDLCALC 50 01/10/2020 1028   LDLCALC 59 11/19/2017 1016    Physical Exam:    VS:  BP 130/86 (BP Location: Right Arm, Patient Position: Sitting, Cuff Size: Normal)   Pulse (!) 56   Ht 5\' 9"  (1.753 m)   Wt 193 lb 6.4 oz (87.7 kg)   SpO2 97%   BMI 28.56 kg/m     Wt Readings from Last 3 Encounters:  02/13/20 193 lb 6.4 oz (87.7 kg)  02/05/20 192 lb 2 oz (87.1 kg)  01/10/20 193 lb 6 oz (87.7 kg)     GEN:  Well nourished, well developed in no acute distress HEENT: Normal NECK: No JVD; No carotid bruits LYMPHATICS: No lymphadenopathy CARDIAC: RRR, no murmurs, no rubs, no gallops RESPIRATORY:  Clear to auscultation without rales, wheezing or rhonchi  ABDOMEN: Soft, non-tender, non-distended MUSCULOSKELETAL:  No edema; No  deformity  SKIN: Warm and dry LOWER EXTREMITIES: no swelling NEUROLOGIC:  Alert and oriented x 3 PSYCHIATRIC:  Normal affect   ASSESSMENT:    1. Atypical chest pain   2. Dyspnea on exertion   3. Essential hypertension   4. Coronary artery disease involving native coronary artery of native heart with angina pectoris (HCC)   5. Mixed hyperlipidemia    PLAN:  In order of problems listed above:  1. Atypical chest pain.  He is taking already aspirin which I will continue.  He is on high intensity statin Lipitor 40 mg daily which I will continue.  I will ask him to start taking Imdur 30 mg daily to see if I can help with his blood pressure as well as with his symptomatology even though his symptoms are very atypical. 2. Dyspnea on exertion.  Echocardiogram will be done to assess left ventricle ejection fraction, stress test will be done to rule out significant ischemia. 3. Essential hypertension blood pressure 09/08/1984.  Blood pressure well controlled we will continue present management.  He tells me that sometimes his blood pressure goes very high.  Hopefully addition of Imdur will help with that. 4. Mixed dyslipidemia: I did review his K PN he did have last fasting lipid profile done in June 2 this year and I see HDL of 62 and LDL 50.  He is on high intense statin which I will continue. 5. Carotic arterial disease: We will schedule him to have carotic ultrasounds later.   Medication Adjustments/Labs and Tests Ordered: Current medicines are reviewed at length with the patient today.  Concerns regarding medicines are outlined above.  No orders of the defined types were placed in this encounter.  Medication changes: No orders of the defined types were placed in this encounter.   Signed, Georgeanna Lea, MD, [facility] 02/13/2020 2:33 PM    [facility]

## 2020-02-13 NOTE — Patient Instructions (Signed)
Medication Instructions:  Your physician has recommended you make the following change in your medication:   START: Imdur 30 mg daily   *If you need a refill on your cardiac medications before your next appointment, please call your pharmacy*   Lab Work: None.  If you have labs (blood work) drawn today and your tests are completely normal, you will receive your results only by: Marland Kitchen MyChart Message (if you have MyChart) OR . A paper copy in the mail If you have any lab test that is abnormal or we need to change your treatment, we will call you to review the results.   Testing/Procedures: Your physician has requested that you have an echocardiogram. Echocardiography is a painless test that uses sound waves to create images of your heart. It provides your doctor with information about the size and shape of your heart and how well your heart's chambers and valves are working. This procedure takes approximately one hour. There are no restrictions for this procedure.     [facility] [address] [place], [place] [address] Phone:  [phone]    Please arrive 15 minutes prior to your appointment time for registration and insurance purposes.  The test will take approximately 3 to 4 hours to complete; you may bring reading material.  If someone comes with you to your appointment, they will need to remain in the main lobby due to limited space in the testing area. **If you are pregnant or breastfeeding, please notify the nuclear lab prior to your appointment**  How to prepare for your Myocardial Perfusion Test: . Do not eat or drink 3 hours prior to your test, except you may have water. . Do not consume products containing caffeine (regular or decaffeinated) 12 hours prior to your test. (ex: coffee, chocolate, sodas, tea). Take only half of the carvedilol dose (6.25 mg ) in the morning on day of test.  . Do wear comfortable clothes (no dresses or overalls) and  walking shoes, tennis shoes preferred (No heels or open toe shoes are allowed). . Do NOT wear cologne, perfume, aftershave, or lotions (deodorant is allowed). . If these instructions are not followed, your test will have to be rescheduled.  Please report to [address] for your test.  If you have questions or concerns about your appointment, you can call the [facility] at [phone].  If you cannot keep your appointment, please provide 24 hours notification to the Nuclear Lab, to avoid a possible $50 charge to your account.   Follow-Up: At [facility], you and your health needs are our priority.  As part of our continuing mission to provide you with exceptional heart care, we have created designated Provider Care Teams.  These Care Teams include your primary Cardiologist (physician) and Advanced Practice Providers (APPs -  Physician Assistants and Nurse Practitioners) who all work together to provide you with the care you need, when you need it.  We recommend signing up for the patient portal called "MyChart".  Sign up information is provided on this After Visit Summary.  MyChart is used to connect with patients for Virtual Visits (Telemedicine).  Patients are able to view lab/test results, encounter notes, upcoming appointments, etc.  Non-urgent messages can be sent to your provider as well.   To learn more about what you can do with MyChart, go to [email or web address].    Your next appointment:   1 month(s)  The format for your next appointment:  In Person  Provider:   Gypsy Balsamobert Krasowski, MD   Other Instructions   Echocardiogram An echocardiogram is a procedure that uses painless sound waves (ultrasound) to produce an image of the heart. Images from an echocardiogram can provide important information about:  Signs of coronary artery disease (CAD).  Aneurysm detection. An aneurysm is a weak or damaged part of an artery wall that  bulges out from the normal force of blood pumping through the body.  Heart size and shape. Changes in the size or shape of the heart can be associated with certain conditions, including heart failure, aneurysm, and CAD.  Heart muscle function.  Heart valve function.  Signs of a past heart attack.  Fluid buildup around the heart.  Thickening of the heart muscle.  A tumor or infectious growth around the heart valves. Tell a health care provider about:  Any allergies you have.  All medicines you are taking, including vitamins, herbs, eye drops, creams, and over-the-counter medicines.  Any blood disorders you have.  Any surgeries you have had.  Any medical conditions you have.  Whether you are pregnant or may be pregnant. What are the risks? Generally, this is a safe procedure. However, problems may occur, including:  Allergic reaction to dye (contrast) that may be used during the procedure. What happens before the procedure? No specific preparation is needed. You may eat and drink normally. What happens during the procedure?   An IV tube may be inserted into one of your veins.  You may receive contrast through this tube. A contrast is an injection that improves the quality of the pictures from your heart.  A gel will be applied to your chest.  A wand-like tool (transducer) will be moved over your chest. The gel will help to transmit the sound waves from the transducer.  The sound waves will harmlessly bounce off of your heart to allow the heart images to be captured in real-time motion. The images will be recorded on a computer. The procedure may vary among health care providers and hospitals. What happens after the procedure?  You may return to your normal, everyday life, including diet, activities, and medicines, unless your health care provider tells you not to do that. Summary  An echocardiogram is a procedure that uses painless sound waves (ultrasound) to produce  an image of the heart.  Images from an echocardiogram can provide important information about the size and shape of your heart, heart muscle function, heart valve function, and fluid buildup around your heart.  You do not need to do anything to prepare before this procedure. You may eat and drink normally.  After the echocardiogram is completed, you may return to your normal, everyday life, unless your health care provider tells you not to do that. This information is not intended to replace advice given to you by your health care provider. Make sure you discuss any questions you have with your health care provider. Document Revised: 11/17/2018 Document Reviewed: 08/29/2016 Elsevier Patient Education  2020 Elsevier Inc[employer]   Cardiac Nuclear Scan A cardiac nuclear scan is a test that measures blood flow to the heart when a person is resting and when he or she is exercising. The test looks for problems such as:  Not enough blood reaching a portion of the heart.  The heart muscle not working normally. You may need this test if:  You have heart disease.  You have had abnormal lab results.  You have had heart surgery or a balloon  procedure to open up blocked arteries (angioplasty).  You have chest pain.  You have shortness of breath. In this test, a radioactive dye (tracer) is injected into your bloodstream. After the tracer has traveled to your heart, an imaging device is used to measure how much of the tracer is absorbed by or distributed to various areas of your heart. This procedure is usually done at a hospital and takes 2-4 hours. Tell a health care provider about:  Any allergies you have.  All medicines you are taking, including vitamins, herbs, eye drops, creams, and over-the-counter medicines.  Any problems you or family members have had with anesthetic medicines.  Any blood disorders you have.  Any surgeries you have had.  Any medical conditions you have.  Whether you  are pregnant or may be pregnant. What are the risks? Generally, this is a safe procedure. However, problems may occur, including:  Serious chest pain and heart attack. This is only a risk if the stress portion of the test is done.  Rapid heartbeat.  Sensation of warmth in your chest. This usually passes quickly.  Allergic reaction to the tracer. What happens before the procedure?  Ask your health care provider about changing or stopping your regular medicines. This is especially important if you are taking diabetes medicines or blood thinners.  Follow instructions from your health care provider about eating or drinking restrictions.  Remove your jewelry on the day of the procedure. What happens during the procedure?  An IV will be inserted into one of your veins.  Your health care provider will inject a small amount of radioactive tracer through the IV.  You will wait for 20-40 minutes while the tracer travels through your bloodstream.  Your heart activity will be monitored with an electrocardiogram (ECG).  You will lie down on an exam table.  Images of your heart will be taken for about 15-20 minutes.  You may also have a stress test. For this test, one of the following may be done: ? You will exercise on a treadmill or stationary bike. While you exercise, your heart's activity will be monitored with an ECG, and your blood pressure will be checked. ? You will be given medicines that will increase blood flow to parts of your heart. This is done if you are unable to exercise.  When blood flow to your heart has peaked, a tracer will again be injected through the IV.  After 20-40 minutes, you will get back on the exam table and have more images taken of your heart.  Depending on the type of tracer used, scans may need to be repeated 3-4 hours later.  Your IV line will be removed when the procedure is over. The procedure may vary among health care providers and hospitals. What  happens after the procedure?  Unless your health care provider tells you otherwise, you may return to your normal schedule, including diet, activities, and medicines.  Unless your health care provider tells you otherwise, you may increase your fluid intake. This will help to flush the contrast dye from your body. Drink enough fluid to keep your urine pale yellow.  Ask your health care provider, or the department that is doing the test: ? When will my results be ready? ? How will I get my results? Summary  A cardiac nuclear scan measures the blood flow to the heart when a person is resting and when he or she is exercising.  Tell your health care provider if you are  pregnant.  Before the procedure, ask your health care provider about changing or stopping your regular medicines. This is especially important if you are taking diabetes medicines or blood thinners.  After the procedure, unless your health care provider tells you otherwise, increase your fluid intake. This will help flush the contrast dye from your body.  After the procedure, unless your health care provider tells you otherwise, you may return to your normal schedule, including diet, activities, and medicines. This information is not intended to replace advice given to you by your health care provider. Make sure you discuss any questions you have with your health care provider. Document Revised: 01/10/2018 Document Reviewed: 01/10/2018 Elsevier Patient Education  2020 [employer].

## 2020-02-27 ENCOUNTER — Encounter: Payer: Self-pay | Admitting: *Deleted

## 2020-02-27 ENCOUNTER — Telehealth: Payer: Self-pay | Admitting: *Deleted

## 2020-02-27 NOTE — Telephone Encounter (Signed)
Left message on voicemail per DPR in reference to upcoming appointment scheduled on 03/05/2020 at 0815 with detailed instructions given per Myocardial Perfusion Study Information Sheet for the test. LM to arrive 15 minutes early, and that it is imperative to arrive on time for appointment to keep from having the test rescheduled. If you need to cancel or reschedule your appointment, please call the office within 24 hours of your appointment. Failure to do so may result in a cancellation of your appointment, and a $50 no show fee. Phone number given for call back for any questions.   Mychart letter sent with instructions.Equilla Que, Adelene Idler

## 2020-03-03 ENCOUNTER — Other Ambulatory Visit: Payer: Self-pay

## 2020-03-03 ENCOUNTER — Emergency Department (HOSPITAL_COMMUNITY): Payer: No Typology Code available for payment source

## 2020-03-03 ENCOUNTER — Encounter (HOSPITAL_COMMUNITY): Payer: Self-pay

## 2020-03-03 ENCOUNTER — Emergency Department (HOSPITAL_COMMUNITY)
Admission: EM | Admit: 2020-03-03 | Discharge: 2020-03-03 | Disposition: A | Discharge status: Home / Self Care | Payer: No Typology Code available for payment source | Attending: Emergency Medicine | Admitting: Emergency Medicine

## 2020-03-03 DIAGNOSIS — R0602 Shortness of breath: Secondary | ICD-10-CM | POA: Diagnosis not present

## 2020-03-03 DIAGNOSIS — R072 Precordial pain: Secondary | ICD-10-CM | POA: Insufficient documentation

## 2020-03-03 DIAGNOSIS — I2510 Atherosclerotic heart disease of native coronary artery without angina pectoris: Secondary | ICD-10-CM | POA: Insufficient documentation

## 2020-03-03 DIAGNOSIS — I10 Essential (primary) hypertension: Secondary | ICD-10-CM | POA: Insufficient documentation

## 2020-03-03 DIAGNOSIS — Z7982 Long term (current) use of aspirin: Secondary | ICD-10-CM | POA: Insufficient documentation

## 2020-03-03 DIAGNOSIS — Z79899 Other long term (current) drug therapy: Secondary | ICD-10-CM | POA: Insufficient documentation

## 2020-03-03 DIAGNOSIS — R0789 Other chest pain: Secondary | ICD-10-CM | POA: Diagnosis present

## 2020-03-03 LAB — TROPONIN I (HIGH SENSITIVITY)
Troponin I (High Sensitivity): 4 ng/L (ref <18)
Troponin I (High Sensitivity): 4 ng/L (ref <18)

## 2020-03-03 LAB — CBC WITH DIFFERENTIAL/PLATELET
Abs Immature Granulocytes: 0.02 10*3/uL (ref 0.00–0.07)
Basophils Absolute: 0 10*3/uL (ref 0.0–0.1)
Basophils Relative: 1 %
Eosinophils Absolute: 0.1 10*3/uL (ref 0.0–0.5)
Eosinophils Relative: 1 %
HCT: 37.7 % — ABNORMAL LOW (ref 39.0–52.0)
Hemoglobin: 12.5 g/dL — ABNORMAL LOW (ref 13.0–17.0)
Immature Granulocytes: 0 %
Lymphocytes Relative: 22 %
Lymphs Abs: 1.5 10*3/uL (ref 0.7–4.0)
MCH: 29 pg (ref 26.0–34.0)
MCHC: 33.2 g/dL (ref 30.0–36.0)
MCV: 87.5 fL (ref 80.0–100.0)
Monocytes Absolute: 0.4 10*3/uL (ref 0.1–1.0)
Monocytes Relative: 6 %
Neutro Abs: 5 10*3/uL (ref 1.7–7.7)
Neutrophils Relative %: 70 %
Platelets: 245 10*3/uL (ref 150–400)
RBC: 4.31 MIL/uL (ref 4.22–5.81)
RDW: 13.2 % (ref 11.5–15.5)
WBC: 7.1 10*3/uL (ref 4.0–10.5)
nRBC: 0 % (ref 0.0–0.2)

## 2020-03-03 LAB — BASIC METABOLIC PANEL
Anion gap: 10 (ref 5–15)
BUN: 10 mg/dL (ref 6–20)
CO2: 25 mmol/L (ref 22–32)
Calcium: 8.8 mg/dL — ABNORMAL LOW (ref 8.9–10.3)
Chloride: 106 mmol/L (ref 98–111)
Creatinine, Ser: 0.94 mg/dL (ref 0.61–1.24)
GFR calc Af Amer: >60 mL/min (ref >60)
GFR calc non Af Amer: >60 mL/min (ref >60)
Glucose, Bld: 102 mg/dL — ABNORMAL HIGH (ref 70–99)
Potassium: 3.4 mmol/L — ABNORMAL LOW (ref 3.5–5.1)
Sodium: 141 mmol/L (ref 135–145)

## 2020-03-03 LAB — BRAIN NATRIURETIC PEPTIDE: B Natriuretic Peptide: 23.1 pg/mL (ref 0.0–100.0)

## 2020-03-03 NOTE — ED Provider Notes (Addendum)
[facility]   CSN: [id number] Arrival date & time: 03/03/20  0057     History Chief Complaint  Patient presents with  . Chest Pain    Brian Velez A Christiansen is a 54 y.o. male.  Patient presents to the emergency department for evaluation of chest pain.  Patient reports that he was sitting at rest when the pain began around 9:30 PM.  He reports that sharp pain associated with shortness of breath, diaphoresis, nausea.  This feels the same as when he had a heart attack years ago.  Patient reports that he was given aspirin and nitroglycerin by EMS.  After he received the nitroglycerin, pain has resolved.  Patient reports that he had a similar episode 2 weeks ago.  He saw his doctor and is being set up to see a new cardiologist.     HPI: A 54 year old patient with a history of CVA, hypertension and hypercholesterolemia presents for evaluation of chest pain. Initial onset of pain was approximately 1-3 hours ago. The patient's chest pain is sharp, is not worse with exertion and is relieved by nitroglycerin. The patient's chest pain is middle- or left-sided, is not well-localized, is not described as heaviness/pressure/tightness and does not radiate to the arms/jaw/neck. The patient does not complain of nausea and denies diaphoresis. The patient has no history of peripheral artery disease, has not smoked in the past 90 days, denies any history of treated diabetes, has no relevant family history of coronary artery disease (first degree relative at less than age 53) and does not have an elevated BMI (>=30).   Past Medical History:  Diagnosis Date  . History of gout   . Hypertension   . MI (myocardial infarction) (HCC) 2004  . Sleep apnea    "don't wear mask anymore"  . Stroke [facility](HCC) 05/27/2014   "right side still weaker" (05/31/2014)    Patient Active Problem List   Diagnosis Date Noted  . Chronic gout involving toe of left foot without tophus  10/28/2018  . 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 Hyperlipidemia 12/10/2017  . Neck pain 12/10/2017  . Radicular pain in right arm 12/10/2017  . Dyspnea on exertion 12/10/2017  . History of myocardial infarction 11/19/2017  . History of stroke 11/19/2017  . Heart disease 11/19/2017  . History of gout 11/19/2017  . Vaccine counseling 11/19/2017  . Need for Tdap vaccination 11/19/2017  . Insomnia 11/19/2017  . Essential hypertension 10/19/2017  . Family history of hypertension 10/19/2017  . Family history of prostate cancer 10/19/2017  . Family history of colon cancer 10/19/2017  . Family history of diabetes mellitus 10/19/2017  . Encounter for hepatitis C screening test for low risk patient 10/19/2017  . 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 Right hemiplegia (HCC) 05/31/2014  . Atypical chest pain 05/31/2014    Past Surgical History:  Procedure Laterality Date  . ANKLE FRACTURE SURGERY Left 2000's  . CARDIAC CATHETERIZATION  2004; 2014  . HAND SURGERY     left palm and fingertip injury  . ROUX-EN-Y GASTRIC BYPASS  2013  . SKIN BIOPSY         Family History  Problem Relation Age of Onset  . Dementia Mother        died of dementia  . Breast cancer Mother   . Hypertension Mother   . Heart disease Mother   . Cancer Mother        breast  .  Diabetes Mother   . CAD Father   . Stroke Father   . Prostate cancer Father 84  . Cancer Father 28       colon  . Hypertension Father   . Diabetes Father   . Heart disease Father   . Colon cancer Father   . Diabetes type II Sister   . Hypertension Sister   . Diabetes Sister   . Diabetes type II Brother   . Hypertension Brother   . Diabetes Brother   . Colon polyps Brother   . Diabetes type II Sister   . Hypertension Sister   . Diabetes Sister   . Diabetes type II Sister   . Hypertension Sister   . Stroke Brother   . Colon cancer Brother   . Colon cancer Paternal Uncle    . Colon cancer Paternal Uncle   . Esophageal cancer Neg Hx   . Rectal cancer Neg Hx   . Stomach cancer Neg Hx     Social History   Tobacco Use  . Smoking status: Never Smoker  . Smokeless tobacco: Never Used  Vaping Use  . Vaping Use: Never used  Substance Use Topics  . Alcohol use: No  . Drug use: No    Home Medications Prior to Admission medications   Medication Sig Start Date End Date Taking? Authorizing Provider  amLODipine (NORVASC) 5 MG tablet Take 1 tablet (5 mg total) by mouth daily. 02/05/20  Yes Sharlene Dory, DO  aspirin EC 81 MG tablet Take 1 tablet (81 mg total) by mouth daily. Swallow whole. 02/05/20  Yes Sharlene Dory, DO  atorvastatin (LIPITOR) 40 MG tablet Take 1 tablet (40 mg total) by mouth daily. 02/05/20  Yes Sharlene Dory, DO  carvedilol (COREG) 12.5 MG tablet Take 1 tablet (12.5 mg total) by mouth 2 (two) times daily with a meal. 02/05/20  Yes Wendling, Jilda Roche, DO  isosorbide mononitrate (IMDUR) 30 MG 24 hr tablet Take 1 tablet (30 mg total) by mouth daily. 02/13/20 05/13/20 Yes Georgeanna Lea, MD  traZODone (DESYREL) 100 MG tablet Take 1 tablet (100 mg total) by mouth at bedtime. 02/05/20  Yes Sharlene Dory, DO    Allergies    Patient has no known allergies.  Review of Systems   Review of Systems  Constitutional: Positive for diaphoresis.  Respiratory: Positive for shortness of breath.   Cardiovascular: Positive for chest pain.  All other systems reviewed and are negative.   Physical Exam Updated Vital Signs BP (!) 136/92   Pulse 56   Temp 97.9 F (36.6 C)   Resp 14   Ht 5\' 9"  (1.753 m)   Wt 83 kg   SpO2 96%   BMI 27.02 kg/m   Physical Exam Vitals and nursing note reviewed.  Constitutional:      General: He is not in acute distress.    Appearance: Normal appearance. He is well-developed.  HENT:     Head: Normocephalic and atraumatic.     Right Ear: Hearing normal.     Left Ear: Hearing  normal.     Nose: Nose normal.  Eyes:     Conjunctiva/sclera: Conjunctivae normal.     Pupils: Pupils are equal, round, and reactive to light.  Cardiovascular:     Rate and Rhythm: Regular rhythm.     Heart sounds: S1 normal and S2 normal. No murmur heard.  No friction rub. No gallop.   Pulmonary:     Effort: Pulmonary effort  is normal. No respiratory distress.     Breath sounds: Normal breath sounds.  Chest:     Chest wall: No tenderness.  Abdominal:     General: Bowel sounds are normal.     Palpations: Abdomen is soft.     Tenderness: There is no abdominal tenderness. There is no guarding or rebound. Negative signs include Murphy's sign and McBurney's sign.     Hernia: No hernia is present.  Musculoskeletal:        General: Normal range of motion.     Cervical back: Normal range of motion and neck supple.  Skin:    General: Skin is warm and dry.     Findings: No rash.  Neurological:     Mental Status: He is alert and oriented to person, place, and time.     GCS: GCS eye subscore is 4. GCS verbal subscore is 5. GCS motor subscore is 6.     Cranial Nerves: No cranial nerve deficit.     Sensory: No sensory deficit.     Coordination: Coordination normal.  Psychiatric:        Speech: Speech normal.        Behavior: Behavior normal.        Thought Content: Thought content normal.     ED Results / Procedures / Treatments   Labs (all labs ordered are listed, but only abnormal results are displayed) Labs Reviewed  CBC WITH DIFFERENTIAL/PLATELET - Abnormal; Notable for the following components:      Result Value   Hemoglobin 12.5 (*)    HCT 37.7 (*)    All other components within normal limits  BASIC METABOLIC PANEL - Abnormal; Notable for the following components:   Potassium 3.4 (*)    Glucose, Bld 102 (*)    Calcium 8.8 (*)    All other components within normal limits  BRAIN NATRIURETIC PEPTIDE  TROPONIN I (HIGH SENSITIVITY)  TROPONIN I (HIGH SENSITIVITY)     EKG EKG Interpretation  Date/Time:  Sunday March 03 2020 01:13:20 EDT Ventricular Rate:  60 PR Interval:    QRS Duration: 143 QT Interval:  455 QTC Calculation: 455 R Axis:   66 Text Interpretation: Sinus rhythm Consider left atrial enlargement Right bundle branch block No significant change since last tracing Confirmed by Gilda Crease [phone]) on 03/03/2020 1:17:37 AM   Radiology DG Chest Port 1 View  Result Date: 03/03/2020 CLINICAL DATA:  Chest pain. EXAM: PORTABLE CHEST 1 VIEW COMPARISON:  Dec 23, 2016 FINDINGS: The heart size and mediastinal contours are within normal limits. Both lungs are clear. The visualized skeletal structures are unremarkable. IMPRESSION: No active disease. Electronically Signed   By: Katherine Mantle M.D.   On: 03/03/2020 02:14    Procedures Procedures (including critical care time)  Medications Ordered in ED Medications - No data to display  ED Course  I have reviewed the triage vital signs and the nursing notes.  Pertinent labs & imaging results that were available during my care of the patient were reviewed by me and considered in my medical decision making (see chart for details).    MDM Rules/Calculators/A&P HEAR Score: 4                        Patient presents to the emergency department for evaluation of chest pain.  Patient had onset of chest pain earlier tonight while sitting at rest.  He was recently seen by cardiology in [facility] for similar symptoms.  It was felt at that time that his symptoms were atypical for cardiac etiology.  He was started on Imdur for elevated blood pressures and to see if this would control his chest pain.  Pain tonight was once again somewhat atypical in that it was sharp in nature and began at rest.  It did, however, resolved with nitroglycerin.  Patient's records indicate a history of myocardial infarction, but it is not clear where this came from.  Records indicate that he has had a heart cath in  2004 in 2014, but no results are available.  He does not remember if he ever got a stent or any intervention with any of the heart catheterizations.  Cardiology note does not indicate this either.  Patient has been comfortable here in the ER. Blood pressure has been well controlled. Troponin negative x2. EKG is reassuring. Heart pathway consistent with follow-up or observation. As he has well-established follow-up with a cardiologist who is already managing the symptoms, patient does not require hospitalization at this time, can follow-up with his cardiologist.  Final Clinical Impression(s) / ED Diagnoses Final diagnoses:  Precordial pain    Rx / DC Orders ED Discharge Orders    None       Annalynn Centanni, Canary Brim, MD 03/03/20 [id number]    Gilda Crease, MD 03/19/20 [phone]

## 2020-03-03 NOTE — ED Triage Notes (Addendum)
Pt. Arrived via [facility] c/o central c/p 10/10 that started at 9pm while at church just sitting. Per EMS, pt. Stated that chest pain has been present intermittently x2 weeks but usually goes away and today it didn't. Pt. was schedule to see his cardiologist on the 27th and they were suppose to run test. EMS stated pt. Initial b/p 200/110  EMS administered 3 sublingual nitro and 650mg  ASA, current b/p 180/110 and chest pain resolved. Denies nausea Hx. MI in 2006

## 2020-03-03 NOTE — ED Notes (Signed)
Got patient vitals patient is resting with call bell in reach 

## 2020-03-05 ENCOUNTER — Ambulatory Visit (INDEPENDENT_AMBULATORY_CARE_PROVIDER_SITE_OTHER): Payer: No Typology Code available for payment source

## 2020-03-05 ENCOUNTER — Other Ambulatory Visit: Payer: Self-pay

## 2020-03-05 DIAGNOSIS — R0789 Other chest pain: Secondary | ICD-10-CM

## 2020-03-05 DIAGNOSIS — R0600 Dyspnea, unspecified: Secondary | ICD-10-CM

## 2020-03-05 DIAGNOSIS — R0609 Other forms of dyspnea: Secondary | ICD-10-CM

## 2020-03-05 LAB — ECHOCARDIOGRAM COMPLETE
Area-P 1/2: 3.37 cm2
Height: 69 in
S' Lateral: 3 cm
Weight: 3088 oz

## 2020-03-05 LAB — MYOCARDIAL PERFUSION IMAGING
LV dias vol: 126 mL (ref 62–150)
LV sys vol: 54 mL
Peak HR: 93 {beats}/min
Rest HR: 55 {beats}/min
SDS: 0
SRS: 1
SSS: 1
TID: 1.06

## 2020-03-05 MED ORDER — REGADENOSON 0.4 MG/5ML IV SOLN
0.4000 mg | Freq: Once | INTRAVENOUS | Status: AC
Start: 2020-03-05 — End: 2020-03-05
  Administered 2020-03-05: 0.4 mg via INTRAVENOUS

## 2020-03-05 MED ORDER — TECHNETIUM TC 99M TETROFOSMIN IV KIT
32.4000 | PACK | Freq: Once | INTRAVENOUS | Status: AC | PRN
  Administered 2020-03-05: 32.4 via INTRAVENOUS

## 2020-03-05 MED ORDER — TECHNETIUM TC 99M TETROFOSMIN IV KIT
10.8000 | PACK | Freq: Once | INTRAVENOUS | Status: AC | PRN
  Administered 2020-03-05: 10.8 via INTRAVENOUS

## 2020-03-05 NOTE — Progress Notes (Signed)
Complete echocardiogram performed.  Jimmy Amoni Scallan RDCS, RTVT 

## 2020-03-08 ENCOUNTER — Telehealth: Payer: Self-pay | Admitting: Cardiology

## 2020-03-08 NOTE — Telephone Encounter (Signed)
Patient is returning Hayley's call regarding echo results. Please call back.

## 2020-03-08 NOTE — Telephone Encounter (Signed)
Called patient back and informed him of results. No further questions.

## 2020-03-28 ENCOUNTER — Ambulatory Visit: Payer: No Typology Code available for payment source | Admitting: Cardiology

## 2020-04-08 ENCOUNTER — Ambulatory Visit: Payer: No Typology Code available for payment source | Admitting: Cardiology

## 2020-04-10 ENCOUNTER — Encounter: Payer: Self-pay | Admitting: General Practice

## 2020-05-03 ENCOUNTER — Ambulatory Visit: Payer: No Typology Code available for payment source | Admitting: Family Medicine

## 2020-05-03 DIAGNOSIS — Z0289 Encounter for other administrative examinations: Secondary | ICD-10-CM

## 2020-05-14 ENCOUNTER — Encounter: Payer: Self-pay | Admitting: Family Medicine

## 2020-05-14 ENCOUNTER — Ambulatory Visit (INDEPENDENT_AMBULATORY_CARE_PROVIDER_SITE_OTHER): Payer: No Typology Code available for payment source | Admitting: Family Medicine

## 2020-05-14 ENCOUNTER — Other Ambulatory Visit: Payer: Self-pay

## 2020-05-14 ENCOUNTER — Telehealth: Payer: Self-pay | Admitting: Family Medicine

## 2020-05-14 VITALS — BP 132/84 | HR 67 | Temp 98.3°F | Ht 69.0 in | Wt 190.5 lb

## 2020-05-14 DIAGNOSIS — M5442 Lumbago with sciatica, left side: Secondary | ICD-10-CM

## 2020-05-14 DIAGNOSIS — N401 Enlarged prostate with lower urinary tract symptoms: Secondary | ICD-10-CM

## 2020-05-14 DIAGNOSIS — Z8042 Family history of malignant neoplasm of prostate: Secondary | ICD-10-CM | POA: Diagnosis not present

## 2020-05-14 DIAGNOSIS — R3911 Hesitancy of micturition: Secondary | ICD-10-CM

## 2020-05-14 DIAGNOSIS — M4802 Spinal stenosis, cervical region: Secondary | ICD-10-CM | POA: Diagnosis not present

## 2020-05-14 MED ORDER — TAMSULOSIN HCL 0.4 MG PO CAPS: 0.4000 mg | ORAL_CAPSULE | Freq: Every day | ORAL | 3 refills | Status: DC

## 2020-05-14 MED ORDER — METHYLPREDNISOLONE ACETATE 80 MG/ML IJ SUSP
80.0000 mg | Freq: Once | INTRAMUSCULAR | Status: AC
  Administered 2020-05-14: 80 mg via INTRAMUSCULAR

## 2020-05-14 MED ORDER — GABAPENTIN 300 MG PO CAPS: 300.0000 mg | ORAL_CAPSULE | Freq: Three times a day (TID) | ORAL | 3 refills | Status: DC

## 2020-05-14 MED ORDER — PREDNISONE 20 MG PO TABS: 40.0000 mg | ORAL_TABLET | Freq: Every day | ORAL | 0 refills | Status: AC

## 2020-05-14 NOTE — Telephone Encounter (Signed)
Patient is scheduled tomorrow with PCP. He will try to hold off until then. We had no openings today. Pain is pretty bad//just concerned because afraid of BP getting elevated/pharmacist checked BP today 161/140... he repeated twice.  Marland Kitchen He is just taking ibuprofen and is not working and just concerned.  He is ok to wait since nothing available.

## 2020-05-14 NOTE — Telephone Encounter (Signed)
Caller name: Aarish Call back number: [phone]   Patient states he is having left arm, left leg pain. He is in a lot of pain. Patient is wondering if you could see hm today.

## 2020-05-14 NOTE — Progress Notes (Signed)
Chief Complaint  Patient presents with  . Pain    left arm/hip and leg    Subjective: Patient is a 54 y.o. male here for pain.  3 weeks ago, the patient started having burning and pins-and-needles in his left upper extremity.  No injury or change in activity.  He denies any neck pain.  No bruising, redness, or swelling.  He is not having any fevers.  He tried ibuprofen/Tylenol with some temporary relief.  Nighttime seems to be worse for him.  No decreased range of motion.  Yesterday, he started having left-sided lower back pain with the same numbness, tingling, and pins-and-needles sensation down his left leg.  He is not losing control of his bowel or bladder function.  No weakness otherwise.  The pain is making it difficult for him to walk without a cane.  He denies any injury or change in activity for this issue also.  Patient has a several month history of difficulty initiating his stream at nighttime.  He does have a strong family history of prostate cancer.  His last PSA was within normal limits.  No increasing PSA velocity.  He has never been on any medication for this.  He does not follow with the urologist.  Past Medical History:  Diagnosis Date  . History of gout   . Hypertension   . MI (myocardial infarction) (HCC) 2004  . Sleep apnea    "don't wear mask anymore"  . Stroke (HCC) 05/27/2014   "right side still weaker" (05/31/2014)    Objective: BP 132/84 (BP Location: Left Arm, Patient Position: Sitting, Cuff Size: Normal)   Pulse 67   Temp 98.3 F (36.8 C) (Oral)   Ht 5\' 9"  (1.753 m)   Wt 190 lb 8 oz (86.4 kg)   SpO2 97%   BMI 28.13 kg/m  General: Awake, appears stated age Rectal: Sphincter of good tone.  The prostate is smooth and firm.  There are no nodules or areas of induration noted. Lungs: No accessory muscle use MSK: +ttp over lateral L neck and L lateral lumbar trunk region. Poor hamstring ROM. No deformity.  Neuro: Sensation intact to light touch throughout.  +Spurling's on L. Neg straight leg. DTR's equal and symmetric throughout, no clonus. Antalgic gait.  4/5 strength with left hip flexion secondary to pain.  5/5 strength in the upper and lower extremities throughout otherwise. Psych: Age appropriate judgment and insight, normal affect and mood  Assessment and Plan: Cervical stenosis of spine - Plan: Ambulatory referral to Physical Therapy, gabapentin (NEURONTIN) 300 MG capsule, predniSONE (DELTASONE) 20 MG tablet, methylPREDNISolone acetate (DEPO-MEDROL) injection 80 mg  Benign prostatic hyperplasia with urinary hesitancy - Plan: tamsulosin (FLOMAX) 0.4 MG CAPS capsule, Ambulatory referral to Urology  Family history of prostate cancer - Plan: Ambulatory referral to Urology  Acute left-sided low back pain with left-sided sciatica - Plan: Ambulatory referral to Physical Therapy, predniSONE (DELTASONE) 20 MG tablet, methylPREDNISolone acetate (DEPO-MEDROL) injection 80 mg  1.  Steroid injection today as above.  Start 40 mg/day prednisone tomorrow for 5 days.  Gabapentin 3 times daily, 300 mg.  Warned about drowsiness.  Stretches and exercises were provided.  Given the severity, I will refer to physical therapy today. 2.  Ice, heat, Tylenol, stretches and exercises.  Physical therapy should benefit for this as well. 3/4.  We will refer to urology for their opinion and reassurance for the patient.  I will start Flomax today.  If he is unable to get in with them  in the next month, I will see him back to recheck how he is doing. The patient voiced understanding and agreement to the plan.  Jilda Roche [place], DO 05/14/20  4:24 PM

## 2020-05-14 NOTE — Telephone Encounter (Signed)
I only have 2 sick slots taken, could he be seen at 3?

## 2020-05-14 NOTE — Telephone Encounter (Signed)
He does not want to do a virtual today as his computer is at work and he is home. He is scheduled tomorrow and will just have to wait until that time. He did just take 3 ibuprofen.

## 2020-05-14 NOTE — Patient Instructions (Signed)
Ice/cold pack over area for 10-15 min twice daily.  Heat (pad or rice pillow in microwave) over affected area, 10-15 minutes twice daily.   OK to take Tylenol 1000 mg (2 extra strength tabs) or 975 mg (3 regular strength tabs) every 6 hours as needed.  If you do not hear anything about your referrals in the next 1-2 weeks, call our office and ask for an update.  EXERCISES RANGE OF MOTION (ROM) AND STRETCHING EXERCISES  These exercises may help you when beginning to rehabilitate your issue. In order to successfully resolve your symptoms, you must improve your posture. These exercises are designed to help reduce the forward-head and rounded-shoulder posture which contributes to this condition. Your symptoms may resolve with or without further involvement from your physician, physical therapist or athletic trainer. While completing these exercises, remember:   Restoring tissue flexibility helps normal motion to return to the joints. This allows healthier, less painful movement and activity.  An effective stretch should be held for at least 20 seconds, although you may need to begin with shorter hold times for comfort.  A stretch should never be painful. You should only feel a gentle lengthening or release in the stretched tissue.  Do not do any stretch or exercise that you cannot tolerate.  STRETCH- Axial Extensors  Lie on your back on the floor. You may bend your knees for comfort. Place a rolled-up hand towel or dish towel, about 2 inches in diameter, under the part of your head that makes contact with the floor.  Gently tuck your chin, as if trying to make a "double chin," until you feel a gentle stretch at the base of your head.  Hold 15-20 seconds. Repeat 2-3 times. Complete this exercise 1 time per day.   STRETCH - Axial Extension   Stand or sit on a firm surface. Assume a good posture: chest up, shoulders drawn back, abdominal muscles slightly tense, knees unlocked (if standing)  and feet hip width apart.  Slowly retract your chin so your head slides back and your chin slightly lowers. Continue to look straight ahead.  You should feel a gentle stretch in the back of your head. Be certain not to feel an aggressive stretch since this can cause headaches later.  Hold for 15-20 seconds. Repeat 2-3 times. Complete this exercise 1 time per day.  STRETCH - Cervical Side Bend   Stand or sit on a firm surface. Assume a good posture: chest up, shoulders drawn back, abdominal muscles slightly tense, knees unlocked (if standing) and feet hip width apart.  Without letting your nose or shoulders move, slowly tip your right / left ear to your shoulder until your feel a gentle stretch in the muscles on the opposite side of your neck.  Hold 15-20 seconds. Repeat 2-3 times. Complete this exercise 1-2 times per day.  STRETCH - Cervical Rotators   Stand or sit on a firm surface. Assume a good posture: chest up, shoulders drawn back, abdominal muscles slightly tense, knees unlocked (if standing) and feet hip width apart.  Keeping your eyes level with the ground, slowly turn your head until you feel a gentle stretch along the back and opposite side of your neck.  Hold 15-20 seconds. Repeat 2-3 times. Complete this exercise 1-2 times per day.  RANGE OF MOTION - Neck Circles   Stand or sit on a firm surface. Assume a good posture: chest up, shoulders drawn back, abdominal muscles slightly tense, knees unlocked (if standing) and feet  hip width apart.  Gently roll your head down and around from the back of one shoulder to the back of the other. The motion should never be forced or painful.  Repeat the motion 10-20 times, or until you feel the neck muscles relax and loosen. Repeat 2-3 times. Complete the exercise 1-2 times per day. STRENGTHENING EXERCISES - Cervical Strain and Sprain These exercises may help you when beginning to rehabilitate your injury. They may resolve your  symptoms with or without further involvement from your physician, physical therapist, or athletic trainer. While completing these exercises, remember:   Muscles can gain both the endurance and the strength needed for everyday activities through controlled exercises.  Complete these exercises as instructed by your physician, physical therapist, or athletic trainer. Progress the resistance and repetitions only as guided.  You may experience muscle soreness or fatigue, but the pain or discomfort you are trying to eliminate should never worsen during these exercises. If this pain does worsen, stop and make certain you are following the directions exactly. If the pain is still present after adjustments, discontinue the exercise until you can discuss the trouble with your clinician.  STRENGTH - Cervical Flexors, Isometric  Face a wall, standing about 6 inches away. Place a small pillow, a ball about 6-8 inches in diameter, or a folded towel between your forehead and the wall.  Slightly tuck your chin and gently push your forehead into the soft object. Push only with mild to moderate intensity, building up tension gradually. Keep your jaw and forehead relaxed.  Hold 10 to 20 seconds. Keep your breathing relaxed.  Release the tension slowly. Relax your neck muscles completely before you start the next repetition. Repeat 2-3 times. Complete this exercise 1 time per day.  STRENGTH- Cervical Lateral Flexors, Isometric   Stand about 6 inches away from a wall. Place a small pillow, a ball about 6-8 inches in diameter, or a folded towel between the side of your head and the wall.  Slightly tuck your chin and gently tilt your head into the soft object. Push only with mild to moderate intensity, building up tension gradually. Keep your jaw and forehead relaxed.  Hold 10 to 20 seconds. Keep your breathing relaxed.  Release the tension slowly. Relax your neck muscles completely before you start the next  repetition. Repeat 2-3 times. Complete this exercise 1 time per day.  STRENGTH - Cervical Extensors, Isometric   Stand about 6 inches away from a wall. Place a small pillow, a ball about 6-8 inches in diameter, or a folded towel between the back of your head and the wall.  Slightly tuck your chin and gently tilt your head back into the soft object. Push only with mild to moderate intensity, building up tension gradually. Keep your jaw and forehead relaxed.  Hold 10 to 20 seconds. Keep your breathing relaxed.  Release the tension slowly. Relax your neck muscles completely before you start the next repetition. Repeat 2-3 times. Complete this exercise 1 time per day.  POSTURE AND BODY MECHANICS CONSIDERATIONS Keeping correct posture when sitting, standing or completing your activities will reduce the stress put on different body tissues, allowing injured tissues a chance to heal and limiting painful experiences. The following are general guidelines for improved posture. Your physician or physical therapist will provide you with any instructions specific to your needs. While reading these guidelines, remember:  The exercises prescribed by your provider will help you have the flexibility and strength to maintain correct postures.  The correct posture provides the optimal environment for your joints to work. All of your joints have less wear and tear when properly supported by a spine with good posture. This means you will experience a healthier, less painful body.  Correct posture must be practiced with all of your activities, especially prolonged sitting and standing. Correct posture is as important when doing repetitive low-stress activities (typing) as it is when doing a single heavy-load activity (lifting).  PROLONGED STANDING WHILE SLIGHTLY LEANING FORWARD When completing a task that requires you to lean forward while standing in one place for a long time, place either foot up on a stationary  2- to 4-inch high object to help maintain the best posture. When both feet are on the ground, the low back tends to lose its slight inward curve. If this curve flattens (or becomes too large), then the back and your other joints will experience too much stress, fatigue more quickly, and can cause pain.   RESTING POSITIONS Consider which positions are most painful for you when choosing a resting position. If you have pain with flexion-based activities (sitting, bending, stooping, squatting), choose a position that allows you to rest in a less flexed posture. You would want to avoid curling into a fetal position on your side. If your pain worsens with extension-based activities (prolonged standing, working overhead), avoid resting in an extended position such as sleeping on your stomach. Most people will find more comfort when they rest with their spine in a more neutral position, neither too rounded nor too arched. Lying on a non-sagging bed on your side with a pillow between your knees, or on your back with a pillow under your knees will often provide some relief. Keep in mind, being in any one position for a prolonged period of time, no matter how correct your posture, can still lead to stiffness.  WALKING Walk with an upright posture. Your ears, shoulders, and hips should all line up. OFFICE WORK When working at a desk, create an environment that supports good, upright posture. Without extra support, muscles fatigue and lead to excessive strain on joints and other tissues.  CHAIR:  A chair should be able to slide under your desk when your back makes contact with the back of the chair. This allows you to work closely.  The chair's height should allow your eyes to be level with the upper part of your monitor and your hands to be slightly lower than your elbows.  Body position: ? Your feet should make contact with the floor. If this is not possible, use a foot rest. ? Keep your ears over your  shoulders. This will reduce stress on your neck and low back.   EXERCISES  RANGE OF MOTION (ROM) AND STRETCHING EXERCISES - Low Back Pain Most people with lower back pain will find that their symptoms get worse with excessive bending forward (flexion) or arching at the lower back (extension). The exercises that will help resolve your symptoms will focus on the opposite motion.  If you have pain, numbness or tingling which travels down into your buttocks, leg or foot, the goal of the therapy is for these symptoms to move closer to your back and eventually resolve. Sometimes, these leg symptoms will get better, but your lower back pain may worsen. This is often an indication of progress in your rehabilitation. Be very alert to any changes in your symptoms and the activities in which you participated in the 24 hours prior to the change.  Sharing this information with your caregiver will allow him or her to most efficiently treat your condition. These exercises may help you when beginning to rehabilitate your injury. Your symptoms may resolve with or without further involvement from your physician, physical therapist or athletic trainer. While completing these exercises, remember:   Restoring tissue flexibility helps normal motion to return to the joints. This allows healthier, less painful movement and activity.  An effective stretch should be held for at least 30 seconds.  A stretch should never be painful. You should only feel a gentle lengthening or release in the stretched tissue. FLEXION RANGE OF MOTION AND STRETCHING EXERCISES:  STRETCH - Flexion, Single Knee to Chest   Lie on a firm bed or floor with both legs extended in front of you.  Keeping one leg in contact with the floor, bring your opposite knee to your chest. Hold your leg in place by either grabbing behind your thigh or at your knee.  Pull until you feel a gentle stretch in your low back. Hold 30 seconds.  Slowly release your  grasp and repeat the exercise with the opposite side. Repeat 2 times. Complete this exercise 3 times per week.   STRETCH - Flexion, Double Knee to Chest  Lie on a firm bed or floor with both legs extended in front of you.  Keeping one leg in contact with the floor, bring your opposite knee to your chest.  Tense your stomach muscles to support your back and then lift your other knee to your chest. Hold your legs in place by either grabbing behind your thighs or at your knees.  Pull both knees toward your chest until you feel a gentle stretch in your low back. Hold 30 seconds.  Tense your stomach muscles and slowly return one leg at a time to the floor. Repeat 2 times. Complete this exercise 3 times per week.   STRETCH - Low Trunk Rotation  Lie on a firm bed or floor. Keeping your legs in front of you, bend your knees so they are both pointed toward the ceiling and your feet are flat on the floor.  Extend your arms out to the side. This will stabilize your upper body by keeping your shoulders in contact with the floor.  Gently and slowly drop both knees together to one side until you feel a gentle stretch in your low back. Hold for 30 seconds.  Tense your stomach muscles to support your lower back as you bring your knees back to the starting position. Repeat the exercise to the other side. Repeat 2 times. Complete this exercise at least 3 times per week.   EXTENSION RANGE OF MOTION AND FLEXIBILITY EXERCISES:  STRETCH - Extension, Prone on Elbows   Lie on your stomach on the floor, a bed will be too soft. Place your palms about shoulder width apart and at the height of your head.  Place your elbows under your shoulders. If this is too painful, stack pillows under your chest.  Allow your body to relax so that your hips drop lower and make contact more completely with the floor.  Hold this position for 30 seconds.  Slowly return to lying flat on the floor. Repeat 2 times. Complete  this exercise 3 times per week.   RANGE OF MOTION - Extension, Prone Press Ups  Lie on your stomach on the floor, a bed will be too soft. Place your palms about shoulder width apart and at the height of your head.  Keeping your back as relaxed as possible, slowly straighten your elbows while keeping your hips on the floor. You may adjust the placement of your hands to maximize your comfort. As you gain motion, your hands will come more underneath your shoulders.  Hold this position 30 seconds.  Slowly return to lying flat on the floor. Repeat 2 times. Complete this exercise 3 times per week.   RANGE OF MOTION- Quadruped, Neutral Spine   Assume a hands and knees position on a firm surface. Keep your hands under your shoulders and your knees under your hips. You may place padding under your knees for comfort.  Drop your head and point your tailbone toward the ground below you. This will round out your lower back like an angry cat. Hold this position for 30 seconds.  Slowly lift your head and release your tail bone so that your back sags into a large arch, like an old horse.  Hold this position for 30 seconds.  Repeat this until you feel limber in your low back.  Now, find your "sweet spot." This will be the most comfortable position somewhere between the two previous positions. This is your neutral spine. Once you have found this position, tense your stomach muscles to support your low back.  Hold this position for 30 seconds. Repeat 2 times. Complete this exercise 3 times per week.   STRENGTHENING EXERCISES - Low Back Sprain These exercises may help you when beginning to rehabilitate your injury. These exercises should be done near your "sweet spot." This is the neutral, low-back arch, somewhere between fully rounded and fully arched, that is your least painful position. When performed in this safe range of motion, these exercises can be used for people who have either a flexion or  extension based injury. These exercises may resolve your symptoms with or without further involvement from your physician, physical therapist or athletic trainer. While completing these exercises, remember:   Muscles can gain both the endurance and the strength needed for everyday activities through controlled exercises.  Complete these exercises as instructed by your physician, physical therapist or athletic trainer. Increase the resistance and repetitions only as guided.  You may experience muscle soreness or fatigue, but the pain or discomfort you are trying to eliminate should never worsen during these exercises. If this pain does worsen, stop and make certain you are following the directions exactly. If the pain is still present after adjustments, discontinue the exercise until you can discuss the trouble with your caregiver.  STRENGTHENING - Deep Abdominals, Pelvic Tilt   Lie on a firm bed or floor. Keeping your legs in front of you, bend your knees so they are both pointed toward the ceiling and your feet are flat on the floor.  Tense your lower abdominal muscles to press your low back into the floor. This motion will rotate your pelvis so that your tail bone is scooping upwards rather than pointing at your feet or into the floor. With a gentle tension and even breathing, hold this position for 3 seconds. Repeat 2 times. Complete this exercise 3 times per week.   STRENGTHENING - Abdominals, Crunches   Lie on a firm bed or floor. Keeping your legs in front of you, bend your knees so they are both pointed toward the ceiling and your feet are flat on the floor. Cross your arms over your chest.  Slightly tip your chin down without bending your neck.  Tense your abdominals and slowly lift your trunk high  enough to just clear your shoulder blades. Lifting higher can put excessive stress on the lower back and does not further strengthen your abdominal muscles.  Control your return to the  starting position. Repeat 2 times. Complete this exercise 3 times per week.   STRENGTHENING - Quadruped, Opposite UE/LE Lift   Assume a hands and knees position on a firm surface. Keep your hands under your shoulders and your knees under your hips. You may place padding under your knees for comfort.  Find your neutral spine and gently tense your abdominal muscles so that you can maintain this position. Your shoulders and hips should form a rectangle that is parallel with the floor and is not twisted.  Keeping your trunk steady, lift your right hand no higher than your shoulder and then your left leg no higher than your hip. Make sure you are not holding your breath. Hold this position for 30 seconds.  Continuing to keep your abdominal muscles tense and your back steady, slowly return to your starting position. Repeat with the opposite arm and leg. Repeat 2 times. Complete this exercise 3 times per week.   STRENGTHENING - Abdominals and Quadriceps, Straight Leg Raise   Lie on a firm bed or floor with both legs extended in front of you.  Keeping one leg in contact with the floor, bend the other knee so that your foot can rest flat on the floor.  Find your neutral spine, and tense your abdominal muscles to maintain your spinal position throughout the exercise.  Slowly lift your straight leg off the floor about 6 inches for a count of 3, making sure to not hold your breath.  Still keeping your neutral spine, slowly lower your leg all the way to the floor. Repeat this exercise with each leg 2 times. Complete this exercise 3 times per week.  POSTURE AND BODY MECHANICS CONSIDERATIONS - Low Back Sprain Keeping correct posture when sitting, standing or completing your activities will reduce the stress put on different body tissues, allowing injured tissues a chance to heal and limiting painful experiences. The following are general guidelines for improved posture.  While reading these guidelines,  remember:  The exercises prescribed by your provider will help you have the flexibility and strength to maintain correct postures.  The correct posture provides the best environment for your joints to work. All of your joints have less wear and tear when properly supported by a spine with good posture. This means you will experience a healthier, less painful body.  Correct posture must be practiced with all of your activities, especially prolonged sitting and standing. Correct posture is as important when doing repetitive low-stress activities (typing) as it is when doing a single heavy-load activity (lifting).  RESTING POSITIONS Consider which positions are most painful for you when choosing a resting position. If you have pain with flexion-based activities (sitting, bending, stooping, squatting), choose a position that allows you to rest in a less flexed posture. You would want to avoid curling into a fetal position on your side. If your pain worsens with extension-based activities (prolonged standing, working overhead), avoid resting in an extended position such as sleeping on your stomach. Most people will find more comfort when they rest with their spine in a more neutral position, neither too rounded nor too arched. Lying on a non-sagging bed on your side with a pillow between your knees, or on your back with a pillow under your knees will often provide some relief. Keep in mind,  being in any one position for a prolonged period of time, no matter how correct your posture, can still lead to stiffness.  PROPER SITTING POSTURE In order to minimize stress and discomfort on your spine, you must sit with correct posture. Sitting with good posture should be effortless for a healthy body. Returning to good posture is a gradual process. Many people can work toward this most comfortably by using various supports until they have the flexibility and strength to maintain this posture on their own. When sitting  with proper posture, your ears will fall over your shoulders and your shoulders will fall over your hips. You should use the back of the chair to support your upper back. Your lower back will be in a neutral position, just slightly arched. You may place a small pillow or folded towel at the base of your lower back for  support.  When working at a desk, create an environment that supports good, upright posture. Without extra support, muscles tire, which leads to excessive strain on joints and other tissues. Keep these recommendations in mind:  CHAIR:  A chair should be able to slide under your desk when your back makes contact with the back of the chair. This allows you to work closely.  The chair's height should allow your eyes to be level with the upper part of your monitor and your hands to be slightly lower than your elbows.  BODY POSITION  Your feet should make contact with the floor. If this is not possible, use a foot rest.  Keep your ears over your shoulders. This will reduce stress on your neck and low back.  INCORRECT SITTING POSTURES  If you are feeling tired and unable to assume a healthy sitting posture, do not slouch or slump. This puts excessive strain on your back tissues, causing more damage and pain. Healthier options include:  Using more support, like a lumbar pillow.  Switching tasks to something that requires you to be upright or walking.  Talking a brief walk.  Lying down to rest in a neutral-spine position.  PROLONGED STANDING WHILE SLIGHTLY LEANING FORWARD  When completing a task that requires you to lean forward while standing in one place for a long time, place either foot up on a stationary 2-4 inch high object to help maintain the best posture. When both feet are on the ground, the lower back tends to lose its slight inward curve. If this curve flattens (or becomes too large), then the back and your other joints will experience too much stress, tire more  quickly, and can cause pain.  CORRECT STANDING POSTURES Proper standing posture should be assumed with all daily activities, even if they only take a few moments, like when brushing your teeth. As in sitting, your ears should fall over your shoulders and your shoulders should fall over your hips. You should keep a slight tension in your abdominal muscles to brace your spine. Your tailbone should point down to the ground, not behind your body, resulting in an over-extended swayback posture.   INCORRECT STANDING POSTURES  Common incorrect standing postures include a forward head, locked knees and/or an excessive swayback. WALKING Walk with an upright posture. Your ears, shoulders and hips should all line-up.  PROLONGED ACTIVITY IN A FLEXED POSITION When completing a task that requires you to bend forward at your waist or lean over a low surface, try to find a way to stabilize 3 out of 4 of your limbs. You can place a  hand or elbow on your thigh or rest a knee on the surface you are reaching across. This will provide you more stability, so that your muscles do not tire as quickly. By keeping your knees relaxed, or slightly bent, you will also reduce stress across your lower back. CORRECT LIFTING TECHNIQUES  DO :  Assume a wide stance. This will provide you more stability and the opportunity to get as close as possible to the object which you are lifting.  Tense your abdominals to brace your spine. Bend at the knees and hips. Keeping your back locked in a neutral-spine position, lift using your leg muscles. Lift with your legs, keeping your back straight.  Test the weight of unknown objects before attempting to lift them.  Try to keep your elbows locked down at your sides in order get the best strength from your shoulders when carrying an object.     Always ask for help when lifting heavy or awkward objects. INCORRECT LIFTING TECHNIQUES DO NOT:   Lock your knees when lifting, even if it is  a small object.  Bend and twist. Pivot at your feet or move your feet when needing to change directions.  Assume that you can safely pick up even a paperclip without proper posture.

## 2020-05-15 ENCOUNTER — Ambulatory Visit: Payer: No Typology Code available for payment source | Admitting: Family Medicine

## 2020-05-20 ENCOUNTER — Other Ambulatory Visit: Payer: Self-pay | Admitting: Family Medicine

## 2020-05-20 MED ORDER — TRAMADOL HCL 50 MG PO TABS: 50.0000 mg | ORAL_TABLET | Freq: Three times a day (TID) | ORAL | 0 refills | Status: DC | PRN

## 2020-05-20 NOTE — Addendum Note (Signed)
Addended by: Radene Gunning on: 05/20/2020 05:04 PM   Modules accepted: Orders

## 2020-05-22 ENCOUNTER — Telehealth: Payer: Self-pay | Admitting: Gastroenterology

## 2020-05-22 ENCOUNTER — Other Ambulatory Visit: Payer: Self-pay

## 2020-05-22 ENCOUNTER — Ambulatory Visit (INDEPENDENT_AMBULATORY_CARE_PROVIDER_SITE_OTHER): Payer: No Typology Code available for payment source | Admitting: Family Medicine

## 2020-05-22 ENCOUNTER — Encounter: Payer: Self-pay | Admitting: Family Medicine

## 2020-05-22 ENCOUNTER — Telehealth: Payer: Self-pay

## 2020-05-22 VITALS — BP 130/74 | HR 76 | Temp 98.4°F | Ht 69.0 in | Wt 195.1 lb

## 2020-05-22 DIAGNOSIS — K625 Hemorrhage of anus and rectum: Secondary | ICD-10-CM | POA: Diagnosis not present

## 2020-05-22 DIAGNOSIS — M4802 Spinal stenosis, cervical region: Secondary | ICD-10-CM

## 2020-05-22 DIAGNOSIS — M79605 Pain in left leg: Secondary | ICD-10-CM | POA: Diagnosis not present

## 2020-05-22 MED ORDER — HYDROCODONE-ACETAMINOPHEN 5-325 MG PO TABS: 1.0000 | ORAL_TABLET | Freq: Four times a day (QID) | ORAL | 0 refills | Status: DC | PRN

## 2020-05-22 MED ORDER — GABAPENTIN 300 MG PO CAPS: ORAL_CAPSULE | ORAL | 3 refills | Status: DC

## 2020-05-22 NOTE — Progress Notes (Signed)
Chief Complaint  Patient presents with  . Pain    Subjective: Patient is a 54 y.o. male here for follow-up.  The patient was seen around a week ago for suspected cervical stenosis of the spine and low back pain.  He was given a shot of Depo-Medrol, placed on gabapentin, and started on a prednisone burst.  He received no relief from that.  Tramadol was called and that also provided no relief.  No other injury or change in activity since that time.  No other new symptoms.  The pain is burning and shooting.  It is sharp when he stands for long periods of time.  He denies any weakness unrelated to pain.  No bowel or bladder incontinence.  He has also noticed bright red blood in his stool and on the toilet tissue today.  He had a normal colonoscopy in 2019 with Dr. Myrtie Neither.  He has no abdominal pain, rectal pain, or nausea/vomiting.  No injury to his bottom.  He is unsure if he has a history of hemorrhoids.  Past Medical History:  Diagnosis Date  . History of gout   . Hypertension   . MI (myocardial infarction) (HCC) 2004  . Sleep apnea    "don't wear mask anymore"  . Stroke (HCC) 05/27/2014   "right side still weaker" (05/31/2014)    Objective: BP 130/74 (BP Location: Left Arm, Patient Position: Sitting, Cuff Size: Normal)   Pulse 76   Temp 98.4 F (36.9 C) (Oral)   Ht 5\' 9"  (1.753 m)   Wt 195 lb 2 oz (88.5 kg)   SpO2 94%   BMI 28.81 kg/m  General: Awake, appears stated age HEENT: MMM, EOMi Heart: RRR, no murmurs Lungs: CTAB, no rales, wheezes or rhonchi. No accessory muscle use Rectum: No external fissures or signs of trauma.  There are old hemorrhoid tags noted. Psych: Age appropriate judgment and insight, normal affect and mood  Assessment and Plan: Pain of left lower extremity - Plan: Ambulatory referral to Sports Medicine, HYDROcodone-acetaminophen (NORCO/VICODIN) 5-325 MG tablet, gabapentin (NEURONTIN) 300 MG capsule  Cervical stenosis of spine - Plan: gabapentin  (NEURONTIN) 300 MG capsule  BRBPR (bright red blood per rectum)  1.  Stop tramadol.  Add hydrocodone.  Ice.  Heat.  Increase dosage of gabapentin to 600 mg in the morning and afternoon.  900 mg in the evening.  Urgent referral to the sports medicine team to help . 2.  As above. 3.  He will recheck to Dr. [place].  Contact information was provided.  It is likely hemorrhoids though with a normal colonoscopy result in 2019. The patient voiced understanding and agreement to the plan.  2020 [place], DO 05/22/20  11:57 AM

## 2020-05-22 NOTE — Telephone Encounter (Signed)
Pt's spouse is requesting a call back from a nurse. Caller states his primary care provider would like for him to have a colonoscopy done soon since his prostate is swollen, pt was informed he is not due for one until 2024 but the pcp is the one suggesting this.

## 2020-05-22 NOTE — Telephone Encounter (Signed)
PA initiated via Covermymeds; KEY: [id number]. Awaiting determination.

## 2020-05-22 NOTE — Telephone Encounter (Signed)
Dr. Myrtie Neither,  Please advise. Thank You

## 2020-05-22 NOTE — Telephone Encounter (Signed)
PCP note from today indicates patient reported rectal bleeding. Had rectal exam at that visit (no mention of prostate problem).  He wanted patient seen at our office.  It is not clear the patient necessarily needs a colonoscopy, but he needs the next available appointment with me or an APP.

## 2020-05-22 NOTE — Patient Instructions (Signed)
Ice/cold pack over area for 10-15 min twice daily.  Heat (pad or rice pillow in microwave) over affected area, 10-15 minutes twice daily.   Stop the tramadol.  Do not drink alcohol, do any illicit/street drugs, drive or do anything that requires alertness while on this medicine.   Let us know if you need anything.

## 2020-05-23 NOTE — Telephone Encounter (Signed)
Lm on vm for patient to return call 

## 2020-05-23 NOTE — Telephone Encounter (Signed)
Spoke with patient regarding recommendations from Dr. Myrtie Neither. Pt is scheduled for a follow up with Dr. Myrtie Neither on 06/13/20 at 8:20 AM. Next available APP appt is 11/3. Advised patient to give [place] a call back if he develops any new or worsening symptoms prior to his appt. Pt verbalized understanding and has no concerns at this time.

## 2020-05-24 NOTE — Telephone Encounter (Signed)
PA approved.

## 2020-05-24 NOTE — Telephone Encounter (Signed)
Effective 05/24/2020 to 11/22/2020.

## 2020-05-27 ENCOUNTER — Other Ambulatory Visit: Payer: Self-pay

## 2020-05-27 ENCOUNTER — Encounter: Payer: Self-pay | Admitting: Family Medicine

## 2020-05-27 ENCOUNTER — Ambulatory Visit (INDEPENDENT_AMBULATORY_CARE_PROVIDER_SITE_OTHER): Payer: No Typology Code available for payment source | Admitting: Family Medicine

## 2020-05-27 ENCOUNTER — Ambulatory Visit (HOSPITAL_BASED_OUTPATIENT_CLINIC_OR_DEPARTMENT_OTHER)
Admission: RE | Admit: 2020-05-27 | Discharge: 2020-05-27 | Disposition: A | Discharge status: Home / Self Care | Payer: No Typology Code available for payment source | Source: Ambulatory Visit | Attending: Family Medicine | Admitting: Family Medicine

## 2020-05-27 VITALS — BP 141/74 | HR 70 | Ht 69.0 in | Wt 190.0 lb

## 2020-05-27 DIAGNOSIS — M5412 Radiculopathy, cervical region: Secondary | ICD-10-CM | POA: Diagnosis not present

## 2020-05-27 DIAGNOSIS — M5416 Radiculopathy, lumbar region: Secondary | ICD-10-CM | POA: Diagnosis not present

## 2020-05-27 NOTE — Progress Notes (Signed)
Medication Samples have been provided to the patient.  Drug name: Rayos       Strength: 5mg         Qty: 2 boxes  LOT: A  Exp.Date: 02/2021  Dosing instructions: Take 2 tablets as close to 10 pm as possible.  The patient has been instructed regarding the correct time, dose, and frequency of taking this medication, including desired effects and most common side effects.   03/2021, MA 3:31 PM 05/27/2020

## 2020-05-27 NOTE — Assessment & Plan Note (Signed)
Has pain in the lateral lower leg.  Does not appear to be associated with the knee.  Has a history of distant ankle surgery.  Symptoms do seem to be radicular in nature.  -Counseled on home exercise therapy and supportive care. -X-ray. -Rayos samples. -Could consider physical therapy or further imaging.

## 2020-05-27 NOTE — Progress Notes (Signed)
Brian Velez - 54 y.o. male MRN [id number]  Date of birth: 05/24/1966  SUBJECTIVE:  Including CC & ROS.  Chief Complaint  Patient presents with  . Leg Pain    left x 1 month    Brian Velez is a 54 y.o. male that is presenting with left arm pain as well as left leg pain.  Denies any new or different exercise or activities.  He works as a Education officer, environmental.  His symptoms seem to be worse in his leg longer he stands.  Neck pain and left arm pain seem to be more constant.  Has a history of gastric bypass.  Has tried prednisone and Vicodin with little improvement.  No history of surgery.  Symptoms seem to be getting worse.   Review of Systems See HPI   HISTORY: Past Medical, Surgical, Social, and Family History Reviewed & Updated per EMR.   Pertinent Historical Findings include:  Past Medical History:  Diagnosis Date  . History of gout   . Hypertension   . MI (myocardial infarction) (HCC) 2004  . Sleep apnea    "don't wear mask anymore"  . Stroke [facility]) 05/27/2014   "right side still weaker" (05/31/2014)    Past Surgical History:  Procedure Laterality Date  . ANKLE FRACTURE SURGERY Left 2000's  . CARDIAC CATHETERIZATION  2004; 2014  . HAND SURGERY     left palm and fingertip injury  . ROUX-EN-Y GASTRIC BYPASS  2013  . SKIN BIOPSY      Family History  Problem Relation Age of Onset  . Dementia Mother        died of dementia  . Breast cancer Mother   . Hypertension Mother   . Heart disease Mother   . Cancer Mother        breast  . Diabetes Mother   . CAD Father   . Stroke Father   . Prostate cancer Father 9  . Cancer Father 24       colon  . Hypertension Father   . Diabetes Father   . Heart disease Father   . Colon cancer Father   . Diabetes type II Sister   . Hypertension Sister   . Diabetes Sister   . Diabetes type II Brother   . Hypertension Brother   . Diabetes Brother   . Colon polyps Brother   . Diabetes type II Sister   . Hypertension Sister   . Diabetes  Sister   . Diabetes type II Sister   . Hypertension Sister   . Stroke Brother   . Colon cancer Brother   . Colon cancer Paternal Uncle   . Colon cancer Paternal Uncle   . Esophageal cancer Neg Hx   . Rectal cancer Neg Hx   . Stomach cancer Neg Hx     Social History   Socioeconomic History  . Marital status: Single    Spouse name: Not on file  . Number of children: Not on file  . Years of education: Not on file  . Highest education level: Not on file  Occupational History  . Not on file  Tobacco Use  . Smoking status: Never Smoker  . Smokeless tobacco: Never Used  Vaping Use  . Vaping Use: Never used  Substance and Sexual Activity  . Alcohol use: No  . Drug use: No  . Sexual activity: Never  Other Topics Concern  . Not on file  Social History Narrative   Brian Velez.   Divorced.  4 children, 1 grandchildren.   Exercise - moderate, goes to the gym   10/2017.   Social Determinants of Health   Financial Resource Strain:   . Difficulty of Paying Living Expenses: Not on file  Food Insecurity:   . Worried About Programme researcher, broadcasting/film/video in the Last Year: Not on file  . Ran Out of Food in the Last Year: Not on file  Transportation Needs:   . Lack of Transportation (Medical): Not on file  . Lack of Transportation (Non-Medical): Not on file  Physical Activity:   . Days of Exercise per Week: Not on file  . Minutes of Exercise per Session: Not on file  Stress:   . Feeling of Stress : Not on file  Social Connections:   . Frequency of Communication with Friends and Family: Not on file  . Frequency of Social Gatherings with Friends and Family: Not on file  . Attends Religious Services: Not on file  . Active Member of Clubs or Organizations: Not on file  . Attends Banker Meetings: Not on file  . Marital Status: Not on file  Intimate Partner Violence:   . Fear of Current or Ex-Partner: Not on file  . Emotionally Abused: Not on file  . Physically Abused: Not on file  .  Sexually Abused: Not on file     PHYSICAL EXAM:  VS: BP (!) 141/74   Pulse 70   Ht 5\' 9"  (1.753 m)   Wt 190 lb (86.2 kg)   BMI 28.06 kg/m  Physical Exam Gen: NAD, alert, cooperative with exam, well-appearing MSK:  Neck/left shoulder: Normal shoulder range of motion. Normal neck flexion extension. No signs of atrophy. Left leg: Normal internal and external rotation of the hip. Normal strength with hip flexion. Negative straight leg raise. Normal flexion extension of the back: Neurovascularly intact     ASSESSMENT & PLAN:   Cervical radiculopathy Symptoms seem more consistent with radiculopathy.  Does not seem to be originating from the shoulder and the shoulder has good range of motion. -Counseled on home exercise therapy and supportive care. -X-ray. -Rayos samples. -Could consider physical therapy or further imaging.  Lumbar radiculopathy Has pain in the lateral lower leg.  Does not appear to be associated with the knee.  Has a history of distant ankle surgery.  Symptoms do seem to be radicular in nature.  -Counseled on home exercise therapy and supportive care. -X-ray. -Rayos samples. -Could consider physical therapy or further imaging.

## 2020-05-27 NOTE — Assessment & Plan Note (Signed)
Symptoms seem more consistent with radiculopathy.  Does not seem to be originating from the shoulder and the shoulder has good range of motion. -Counseled on home exercise therapy and supportive care. -X-ray. -Rayos samples. -Could consider physical therapy or further imaging.

## 2020-05-27 NOTE — Patient Instructions (Signed)
Nice to meet you Please try the exercises  Please try 2 pills of Rayos as close to 10 pm  I will call with the results from today   Please send me a message in MyChart with any questions or updates.  Please see me back in 2 weeks.   --Dr. Jordan Likes

## 2020-05-28 ENCOUNTER — Telehealth: Payer: Self-pay | Admitting: Family Medicine

## 2020-05-28 NOTE — Telephone Encounter (Signed)
Left VM for patient. If he calls back please have him speak with a nurse/CMA and inform that his neck xray show only mild degenerative changes. His low back xray shows mild degenerative changes as well.   If any questions then please take the best time and phone number to call and I will try to call him back.   Myra Rude, MD [facility] 05/28/2020, 8:35 AM

## 2020-05-29 NOTE — Telephone Encounter (Signed)
Spoke with patient, he states that he is still having light rectal bleeding but it is just not normal for him and would like to be seen sooner. Advised that Dr. Myrtie Neither does not have any sooner appts. [name], NP had a cancellation on 10/25 at 10 AM, pt states that he would be able to make this time. Appt with Dr. Myrtie Neither on 11/4 has been cancelled. Advised patient that if he starts to have any heavy rectal bleeding or any lightheadedness he will need to be evaluated in the ED. Patient verbalized understanding and has no other concerns at this time.

## 2020-05-30 ENCOUNTER — Ambulatory Visit: Payer: No Typology Code available for payment source

## 2020-06-03 ENCOUNTER — Encounter: Payer: Self-pay | Admitting: Nurse Practitioner

## 2020-06-03 ENCOUNTER — Ambulatory Visit (INDEPENDENT_AMBULATORY_CARE_PROVIDER_SITE_OTHER): Payer: No Typology Code available for payment source | Admitting: Nurse Practitioner

## 2020-06-03 VITALS — BP 120/72 | HR 64 | Ht 67.75 in | Wt 194.0 lb

## 2020-06-03 DIAGNOSIS — K648 Other hemorrhoids: Secondary | ICD-10-CM | POA: Diagnosis not present

## 2020-06-03 DIAGNOSIS — K625 Hemorrhage of anus and rectum: Secondary | ICD-10-CM

## 2020-06-03 MED ORDER — HYDROCORTISONE (PERIANAL) 2.5 % EX CREA
1.0000 | TOPICAL_CREAM | Freq: Every day | CUTANEOUS | 0 refills | Status: AC
Start: 2020-06-03 — End: 2020-06-13

## 2020-06-03 NOTE — Patient Instructions (Addendum)
If you are age 53 or older, your body mass index should be between 23-30. Your Body mass index is 29.72 kg/m. If this is out of the aforementioned range listed, please consider follow up with your Primary Care Provider.  If you are age 52 or younger, your body mass index should be between 19-25. Your Body mass index is 29.72 kg/m. If this is out of the aformentioned range listed, please consider follow up with your Primary Care Provider.   START using Benefiber as directed daily.  START Anusol Cream every night for 10 nights at bedtime.   Call the the office in 10-14 days with an update on how you are doing.

## 2020-06-03 NOTE — Progress Notes (Signed)
ASSESSMENT AND PLAN    # 54 yo male with intermittent, painless rectal bleeding with bowel movements  x 3 weeks. Bleeding most likely related to internal hemorrhoidal seen on anoscopy today.  --Anusol cream PR QHS x 10 days --Benefiber daily --Do not strain with defecation. Do not sit on toilet for prolonged periods of time.  --Call with condition update in 10-14 days. If persist bleeding then consider hemorrhoid banding, brochure given.   # [facility] of colon cancer in father but 85 yrs of age or older --Patient up to date on colonoscopy, last one May 2019 with findings of a few signoid diverticula. Exam was complete with good prep ( BBPS 7).      HISTORY OF PRESENT ILLNESS     Primary Gastroenterologist :Amada Jupiter, MD  Chief Complaint : rectal bleeding.   Brian Velez is a 54 y.o. male with PMH / PSH significant for,  but not necessarily limited to: HTN, hyperlipidemia, FMH of colon cancer in father, diastolic dysfunction, obesity and gout    Patient here for intermittent, painless rectal bleeding with BMs for three weeks.  At one point after a BM blood was dripping down his leg.  Denies constipation / hard stools. Doesn't take blood thinners. He otherwise feels okay.   Patient has a hard time initiating urinary flow. PCP told him his prostate was enlarged.   Previous Endoscopic Evaluations / Pertinent Studies:  May 2019 Screening colonocopy.  --Complete exam good prep BBPS 7 -- A few sigmoid diverticula  Past Medical History:  Diagnosis Date  . History of gout   . Hypertension   . MI (myocardial infarction) (HCC) 2004  . Sleep apnea    "don't wear mask anymore"  . Stroke [facility]) 05/27/2014   "right side still weaker" (05/31/2014)    Current Medications, Allergies, Past Surgical History, Family History and Social History were reviewed in [employer] record.   Current Outpatient Medications  Medication Sig Dispense Refill  . amLODipine  (NORVASC) 5 MG tablet Take 1 tablet (5 mg total) by mouth daily. 30 tablet 3  . aspirin EC 81 MG tablet Take 1 tablet (81 mg total) by mouth daily. Swallow whole. 30 tablet 11  . atorvastatin (LIPITOR) 40 MG tablet Take 1 tablet (40 mg total) by mouth daily. 90 tablet 2  . carvedilol (COREG) 12.5 MG tablet Take 1 tablet (12.5 mg total) by mouth 2 (two) times daily with a meal. 60 tablet 2  . gabapentin (NEURONTIN) 300 MG capsule Take 2 capsules in the morning and afternoon. Take 3 in the evening. 300 capsule 3  . isosorbide mononitrate (IMDUR) 30 MG 24 hr tablet Take 1 tablet (30 mg total) by mouth daily. 90 tablet 1  . tamsulosin (FLOMAX) 0.4 MG CAPS capsule Take 1 capsule (0.4 mg total) by mouth daily. 30 capsule 3  . traZODone (DESYREL) 100 MG tablet Take 1 tablet (100 mg total) by mouth at bedtime. 30 tablet 3  . HYDROcodone-acetaminophen (NORCO/VICODIN) 5-325 MG tablet Take 1 tablet by mouth every 6 (six) hours as needed for moderate pain. (Patient not taking: Reported on 06/03/2020) 15 tablet 0   No current facility-administered medications for this visit.    Review of Systems: No chest pain. No shortness of breath. No urinary complaints.   PHYSICAL EXAM :    Wt Readings from Last 3 Encounters:  06/03/20 194 lb (88 kg)  05/27/20 190 lb (86.2 kg)  05/22/20 195 lb 2 oz (88.5  kg)    BP 120/72 (BP Location: Left Arm, Patient Position: Sitting, Cuff Size: Normal)   Pulse 64   Ht 5' 7.75" (1.721 m) Comment: height measured without shoes  Wt 194 lb (88 kg)   BMI 29.72 kg/m  Constitutional:  Pleasant male in no acute distress. Psychiatric: Normal mood and affect. Behavior is normal. EENT: Pupils normal.  Conjunctivae are normal. No scleral icterus. Neck supple.  Cardiovascular: Normal rate, regular rhythm. No edema Pulmonary/chest: Effort normal and breath sounds normal. No wheezing, rales or rhonchi. Abdominal: Soft, nondistended, nontender. Bowel sounds active throughout. There  are no masses palpable. No hepatomegaly. Rectal: external hemorrhoidal tags. On anoscopy there were large internal hemorrhoids.  Neurological: Alert and oriented to person place and time. Skin: Skin is warm and dry. No rashes noted.  [name], NP  06/03/2020, 10:24 AM  Cc:   Sharlene Dory*

## 2020-06-05 ENCOUNTER — Telehealth: Payer: Self-pay | Admitting: Family Medicine

## 2020-06-05 MED ORDER — PREGABALIN 75 MG PO CAPS
75.0000 mg | ORAL_CAPSULE | Freq: Two times a day (BID) | ORAL | 0 refills | Status: DC
Start: 2020-06-05 — End: 2020-06-11

## 2020-06-05 NOTE — Telephone Encounter (Signed)
Patient states he is a lot of pain, his legs hurt and the gabapentin (NEURONTIN) 300 MG capsule [[id number] Is not working. Patient would like to know if Dr Carmelia Roller will send in another medication to help with pain.  Please Advise

## 2020-06-05 NOTE — Progress Notes (Signed)
____________________________________________________________  Attending physician addendum:  Thank you for sending this case to me. I have reviewed the entire note, and the outlined plan seems appropriate.  If problem persists despite above, please set up for banding if he is agreeable.  Amada Jupiter, MD  ____________________________________________________________

## 2020-06-10 ENCOUNTER — Other Ambulatory Visit: Payer: Self-pay

## 2020-06-10 ENCOUNTER — Encounter: Payer: Self-pay | Admitting: Family Medicine

## 2020-06-10 ENCOUNTER — Ambulatory Visit (INDEPENDENT_AMBULATORY_CARE_PROVIDER_SITE_OTHER): Payer: No Typology Code available for payment source | Admitting: Family Medicine

## 2020-06-10 VITALS — BP 172/91 | HR 63 | Ht 69.0 in | Wt 191.0 lb

## 2020-06-10 DIAGNOSIS — M5416 Radiculopathy, lumbar region: Secondary | ICD-10-CM | POA: Diagnosis not present

## 2020-06-10 MED ORDER — HYDROCODONE-ACETAMINOPHEN 5-325 MG PO TABS: 1.0000 | ORAL_TABLET | Freq: Three times a day (TID) | ORAL | 0 refills | Status: DC | PRN

## 2020-06-10 NOTE — Assessment & Plan Note (Signed)
Symptoms are acutely worsening.  Concern for nerve impingement.  Does have significant family history for prostate cancer. -Counseled on home exercise therapy and supportive care. -Norco for severe pain. -MRI to evaluate for nerve impingement and for epidural use.

## 2020-06-10 NOTE — Patient Instructions (Signed)
Good to see you Please try the norco for severe pain   Please send me a message in MyChart with any questions or updates.  We will setup a virtual visit once the MRI is resulted.   --Dr. Jordan Likes

## 2020-06-10 NOTE — Progress Notes (Signed)
Brian Velez - 54 y.o. male MRN [id number]  Date of birth: 05-02-1966  SUBJECTIVE:  Including CC & ROS.  Chief Complaint  Patient presents with  . Follow-up    left-sided low back    Brian Velez is a 54 y.o. male that is presenting with acute worsening of his left leg pain.  He has not had improvement with the gabapentin or conservative measures thus far.  Symptoms been ongoing for about 8 weeks.  Has a significant family history of prostate cancer.  Has a history of gastric bypass in 2008.  Symptoms are worse with walking as well as lying down.   Review of Systems See HPI   HISTORY: Past Medical, Surgical, Social, and Family History Reviewed & Updated per EMR.   Pertinent Historical Findings include:  Past Medical History:  Diagnosis Date  . History of gout   . Hypertension   . MI (myocardial infarction) (HCC) 2004  . Sleep apnea    "don't wear mask anymore"  . Stroke [facility]) 05/27/2014   "right side still weaker" (05/31/2014)    Past Surgical History:  Procedure Laterality Date  . ANKLE FRACTURE SURGERY Left 2000's  . CARDIAC CATHETERIZATION  2004; 2014  . HAND SURGERY     left palm and fingertip injury  . ROUX-EN-Y GASTRIC BYPASS  2013  . SKIN BIOPSY      Family History  Problem Relation Age of Onset  . Dementia Mother        died of dementia  . Breast cancer Mother   . Hypertension Mother   . Heart disease Mother   . Cancer Mother        breast  . Diabetes Mother   . CAD Father   . Stroke Father   . Prostate cancer Father 51  . Hypertension Father   . Diabetes Father   . Heart disease Father   . Colon cancer Father 60  . Diabetes type II Sister   . Hypertension Sister   . Diabetes Sister   . Diabetes type II Brother   . Hypertension Brother   . Diabetes Brother   . Colon polyps Brother   . Diabetes type II Sister   . Hypertension Sister   . Diabetes Sister   . Diabetes type II Sister   . Hypertension Sister   . Stroke Brother   . Colon  cancer Brother   . Colon cancer Paternal Uncle   . Colon cancer Paternal Uncle   . Prostate cancer Paternal Uncle   . Esophageal cancer Neg Hx   . Rectal cancer Neg Hx   . Stomach cancer Neg Hx     Social History   Socioeconomic History  . Marital status: Single    Spouse name: Not on file  . Number of children: Not on file  . Years of education: Not on file  . Highest education level: Not on file  Occupational History  . Not on file  Tobacco Use  . Smoking status: Never Smoker  . Smokeless tobacco: Never Used  Vaping Use  . Vaping Use: Never used  Substance and Sexual Activity  . Alcohol use: No  . Drug use: No  . Sexual activity: Never  Other Topics Concern  . Not on file  Social History Narrative   [name].   Divorced.   4 children, 1 grandchildren.   Exercise - moderate, goes to the gym   10/2017.   Social Determinants of Corporate investment banker  Strain:   . Difficulty of Paying Living Expenses: Not on file  Food Insecurity:   . Worried About Programme researcher, broadcasting/film/video in the Last Year: Not on file  . Ran Out of Food in the Last Year: Not on file  Transportation Needs:   . Lack of Transportation (Medical): Not on file  . Lack of Transportation (Non-Medical): Not on file  Physical Activity:   . Days of Exercise per Week: Not on file  . Minutes of Exercise per Session: Not on file  Stress:   . Feeling of Stress : Not on file  Social Connections:   . Frequency of Communication with Friends and Family: Not on file  . Frequency of Social Gatherings with Friends and Family: Not on file  . Attends Religious Services: Not on file  . Active Member of Clubs or Organizations: Not on file  . Attends Banker Meetings: Not on file  . Marital Status: Not on file  Intimate Partner Violence:   . Fear of Current or Ex-Partner: Not on file  . Emotionally Abused: Not on file  . Physically Abused: Not on file  . Sexually Abused: Not on file     PHYSICAL EXAM:    VS: BP (!) 172/91   Pulse 63   Ht 5\' 9"  (1.753 m)   Wt 191 lb (86.6 kg)   BMI 28.21 kg/m  Physical Exam Gen: NAD, alert, cooperative with exam, well-appearing MSK:  Back/ Left leg:  Motor strength resistance. Normal internal and external rotation of the hip. Positive straight leg raise. Neurovascularly intact     ASSESSMENT & PLAN:   Lumbar radiculopathy Symptoms are acutely worsening.  Concern for nerve impingement.  Does have significant family history for prostate cancer. -Counseled on home exercise therapy and supportive care. -Norco for severe pain. -MRI to evaluate for nerve impingement and for epidural use.

## 2020-06-11 ENCOUNTER — Ambulatory Visit (INDEPENDENT_AMBULATORY_CARE_PROVIDER_SITE_OTHER): Payer: No Typology Code available for payment source | Admitting: Family Medicine

## 2020-06-11 ENCOUNTER — Encounter: Payer: Self-pay | Admitting: Family Medicine

## 2020-06-11 VITALS — BP 134/86 | HR 67 | Temp 98.7°F | Ht 69.0 in | Wt 190.0 lb

## 2020-06-11 DIAGNOSIS — M5416 Radiculopathy, lumbar region: Secondary | ICD-10-CM | POA: Diagnosis not present

## 2020-06-11 DIAGNOSIS — I10 Essential (primary) hypertension: Secondary | ICD-10-CM | POA: Diagnosis not present

## 2020-06-11 MED ORDER — KETOROLAC TROMETHAMINE 60 MG/2ML IM SOLN
60.0000 mg | Freq: Once | INTRAMUSCULAR | Status: AC
  Administered 2020-06-11: 60 mg via INTRAMUSCULAR

## 2020-06-11 MED ORDER — AMITRIPTYLINE HCL 50 MG PO TABS: 50.0000 mg | ORAL_TABLET | Freq: Every day | ORAL | 3 refills | Status: DC

## 2020-06-11 NOTE — Patient Instructions (Addendum)
Take the new medicine at night. It can make you drowsy.   Keep the diet clean and stay active.  [phone] for imaging service regarding your MRI.   Ice/cold pack over area for 10-15 min twice daily.  Heat (pad or rice pillow in microwave) over affected area, 10-15 minutes twice daily.   Let us know if you need anything.

## 2020-06-11 NOTE — Progress Notes (Signed)
Chief Complaint  Patient presents with  . Follow-up    Subjective: Patient is a 54 y.o. male here for f/u.  F/u for lumbar radiculopathy. Saw sports med yesterday, rx'd Norco and MRI ordered. Not helpful. Failed gabapentin, Tramadol, Lyrica, pred burst also. Still having shooting pain down leg. Brother had similar issue made better by epidural injection in addition to surgery.   Hypertension Patient presents for hypertension follow up. He does monitor home blood pressures. Blood pressures ranging on average from 120-130's/70-80's. He is compliant with medications- Norvasc 5 mg/d, Coreg 12.5 mg bid. Patient has these side effects of medication: none He is usually adhering to a healthy diet overall. Exercise: none recently due to above.    Past Medical History:  Diagnosis Date  . History of gout   . Hypertension   . MI (myocardial infarction) (HCC) 2004  . Sleep apnea    "don't wear mask anymore"  . Stroke (HCC) 05/27/2014   "right side still weaker" (05/31/2014)    Objective: BP 134/86 (BP Location: Left Arm, Patient Position: Sitting, Cuff Size: Normal)   Pulse 67   Temp 98.7 F (37.1 C) (Oral)   Ht 5\' 9"  (1.753 m)   Wt 190 lb (86.2 kg)   SpO2 97%   BMI 28.06 kg/m  General: Awake, appears stated age Heart: RRR, no LE edema Lungs: CTAB, no rales, wheezes or rhonchi. No accessory muscle use Psych: Age appropriate judgment and insight, normal affect and mood  Assessment and Plan: Lumbar radiculopathy - Plan: amitriptyline (ELAVIL) 50 MG tablet, ketorolac (TORADOL) injection 60 mg  Essential hypertension   1. Toradol injection today. Cont Norco prn. Start Elavil 50 mg qhs. Radiology # given to sched MRI. 2. Cont Norvasc 5 mg/d, Coreg 12.5 mg bid. Counseled on diet and exercise when physically healthy.  F/u in 7 mo for CPE or prn.  The patient voiced understanding and agreement to the plan.  [place], DO 06/11/20  9:51 AM

## 2020-06-13 ENCOUNTER — Ambulatory Visit: Payer: No Typology Code available for payment source | Admitting: Gastroenterology

## 2020-06-13 ENCOUNTER — Ambulatory Visit: Payer: No Typology Code available for payment source | Attending: Family Medicine | Admitting: Physical Therapy

## 2020-06-13 ENCOUNTER — Other Ambulatory Visit: Payer: Self-pay

## 2020-06-13 ENCOUNTER — Encounter: Payer: Self-pay | Admitting: Physical Therapy

## 2020-06-13 DIAGNOSIS — M6281 Muscle weakness (generalized): Secondary | ICD-10-CM | POA: Diagnosis present

## 2020-06-13 DIAGNOSIS — R2689 Other abnormalities of gait and mobility: Secondary | ICD-10-CM | POA: Diagnosis present

## 2020-06-13 DIAGNOSIS — M542 Cervicalgia: Secondary | ICD-10-CM | POA: Diagnosis present

## 2020-06-13 DIAGNOSIS — M5442 Lumbago with sciatica, left side: Secondary | ICD-10-CM | POA: Insufficient documentation

## 2020-06-13 NOTE — Patient Instructions (Addendum)
Access Code: [email or web address] URL: [email or web address] Date: 06/13/2020 Prepared by: [facility].  Exercises Supine Lower Trunk Rotation - 1 x daily - 7 x weekly - 3 sets - 10 reps Static Prone on Elbows - 1 x daily - 7 x weekly - 3 sets - 10 reps Supine Piriformis Stretch - 1 x daily - 7 x weekly - 3 sets - 10 reps

## 2020-06-13 NOTE — Therapy (Addendum)
[place], [place], [address] Phone: [phone]   Fax:  [phone]  Physical Therapy Evaluation / Discharge  Patient Details  Name: Brian SCHRECENGOST MRN: [id number] Date of Birth: [birth year] Referring Provider (PT): Shelda Pal, [place]   Encounter Date: 06/13/2020   PT End of Session - 06/13/20 1004    Visit Number 1    Number of Visits 13    Date for PT Re-Evaluation 07/25/20    Authorization Type FOTO at 6th and 10th visit    Authorization Time Period --    PT Start Time 0809   Pt arrived late and had to send a text message at beginning of eval   PT Stop Time 0849    PT Time Calculation (min) 40 min    Activity Tolerance Patient limited by pain    Behavior During Therapy [facility] for tasks assessed/performed   Needed water and tissue due to pain          Past Medical History:  Diagnosis Date  . History of gout   . Hypertension   . MI (myocardial infarction) ([place]) 2004  . Sleep apnea    "don't wear mask anymore"  . Stroke [facility]) 05/27/2014   "right side still weaker" (05/31/2014)    Past Surgical History:  Procedure Laterality Date  . ANKLE FRACTURE SURGERY Left 2000's  . CARDIAC CATHETERIZATION  2004; 2014  . HAND SURGERY     left palm and fingertip injury  . ROUX-EN-Y GASTRIC BYPASS  2013  . SKIN BIOPSY      There were no vitals filed for this visit.    Subjective Assessment - 06/13/20 0813    Subjective Three weeks ago I started feeling some tingling sensation in my left arm. I was checking my BP and it was pretty normal. Gradually my left leg has been giving me excruciating pain especially while standing, walking to the car I have to take a break and sit on the curb. MRI scheduled on Saturday for an epidural shot to see if that can help. I don't understand, I haven't fallen, haven't had a car accident. MD thinks I have a slipped disc which is pinching a nerve. The more I walk, the  worse it gets, even if I stand up on both feet. Laying down doesn't help (sleep on back and right side) and my sleep has been disturbed. Pt denies any red flags but does note problems with voiding urine.    Limitations Standing    How long can you sit comfortably? PRN    How long can you stand comfortably? 3 min    How long can you walk comfortably? 3 min    Diagnostic tests MRI scheduled    Patient Stated Goals Relieve the pain    Currently in Pain? Yes    Pain Score 5    Pain is increased with walking but settles with sitting   Pain Location Leg    Pain Orientation Left    Pain Descriptors / Indicators Radiating;Stabbing    Pain Type Acute pain    Pain Radiating Towards Left, lateral leg    Pain Onset 1 to 4 weeks ago    Pain Frequency Constant    Aggravating Factors  Standing, walking, laying down    Pain Relieving Factors Sitting    Effect of Pain on Daily Activities limiting in any upright activities    Multiple Pain Sites Yes    Pain Score 3  Pain Location Arm    Pain Orientation Left    Pain Descriptors / Indicators Tingling    Pain Radiating Towards left    Pain Onset 1 to 4 weeks ago    Pain Frequency Intermittent    Aggravating Factors  arm feels like it's asleep    Pain Relieving Factors Sitting still              [facility] PT Assessment - 06/13/20 0001      Assessment   Medical Diagnosis Cervical stenosis of spine M48.02, Acute left-sided low back pain with left-sided sciatica M54.42    Referring Provider (PT) Shelda Pal, DO    Onset Date/Surgical Date 05/23/20    Hand Dominance Right    Next MD Visit 06/15/2020   MRI   Prior Therapy none      Precautions   Precautions None      Restrictions   Weight Bearing Restrictions No      Balance Screen   Has the patient fallen in the past 6 months No    Has the patient had a decrease in activity level because of a fear of falling?  No    Is the patient reluctant to leave their home because of a fear  of falling?  No      Prior Function   Vocation --   [place]     Cognition   Overall Cognitive Status Within Functional Limits for tasks assessed    Attention Focused    Focused Attention Appears intact    Memory Appears intact    Awareness Appears intact      Observation/Other Assessments   Focus on Therapeutic Outcomes (FOTO)  lumbar 70% limited; cervical 60% limited   12 visits: lumbar 45% limited; cervical 38% limited     Posture/Postural Control   Posture/Postural Control --      AROM   AROM Assessment Site Shoulder;Cervical;Lumbar    Right/Left Shoulder Left    Lumbar Flexion 70   with CC pain   Lumbar Extension 20   decreased CC pain; possible centralization   Lumbar - Right Side Bend 20    Lumbar - Left Side Bend 20   with CC pain     Strength   Overall Strength Comments Left LE not tested due to pain    Strength Assessment Site Hip    Right/Left Hip Right;Left    Right Hip Flexion 4/5    Right Hip ABduction 4/5    Left Hip Flexion --   NT due to pain and irritability     Flexibility   Soft Tissue Assessment /Muscle Length --      Palpation   SI assessment  CPAs L1-L5; CCpain noted from L3-L5 with highest level of pain at L5    Palpation comment No noticeable increase in tone of lumbar parspinals      Ambulation/Gait   Ambulation/Gait Yes    Assistive device Straight cane    Gait Pattern Antalgic;Trunk flexed    Ambulation Surface Level                      Objective measurements completed on examination: See above findings.       [place] Adult PT Treatment/Exercise - 06/13/20 0001      Exercises   Exercises Lumbar      Lumbar Exercises: Stretches   Lower Trunk Rotation --   Demonstration   Prone on Elbows Stretch --   Demonstration, pillows under  abdomen, 5s hold and release   Piriformis Stretch --   Supine, pain free range                 PT Education - 06/13/20 1003    Education Details Physical therapy treatment process,  anatomy of affected area, reviewed HEP with demonstration (prone on elbows, supine piriformis stretch, lower trunk rotations)    Person(s) Educated Patient    Methods Explanation;Demonstration;Handout    Comprehension Verbalized understanding            PT Short Term Goals - 06/13/20 1025      PT SHORT TERM GOAL #1   Title Pt will be IND with HEP to promote functional independence    Time 3    Period Weeks    Target Date 07/04/20      PT SHORT TERM GOAL #2   Title Pt will report resting pain in low back to be </= 3/10 to increase exercise tolerance    Time 3    Period Weeks    Status New    Target Date 07/04/20      PT SHORT TERM GOAL #3   Title Pt will report increased tolerance for walking up to 5 min with </= 3/10 pain    Time 3    Period Weeks    Status New    Target Date 07/04/20             PT Long Term Goals - 06/13/20 1028      PT LONG TERM GOAL #1   Title Pt will increase FOTO score to </= 45% limitation for lumbar and 38% limitation for cervical    Baseline 70% limited lumbar spine; 60% limited cervical spine    Time 6    Period Weeks    Status New    Target Date 07/25/20      PT LONG TERM GOAL #2   Title Pt will be independent with all exercises and HEP to promote increased capacity to perform ADLS and increase quality of life    Time 6    Period Weeks    Status New    Target Date 07/25/20      PT LONG TERM GOAL #3   Title Pt will increase walking time to 67mn prior to onset of pain with report </= 3/10 pain to increase functional mobility and community participation    Baseline 387m    Time 6    Period Weeks    Status New    Target Date 07/25/20      PT LONG TERM GOAL #4   Title Pt will increase lumbar range of motion to WNL with no pain report in order to increase mobility and exercise tolerance for completion of ADLs.    Time 6    Period Weeks    Status New    Target Date 07/25/20                  Plan - 06/13/20 1007     Clinical Impression Statement Pt is a [age]ear old [facility] of severe low back pain radiating into the left leg, and unassociated cervical pain radiating into left hand. Pt reported that the lowback and leg pain is more severe and debilitating so will be the focus of therapy at the beginning. Pt details strongest pain report and radicular symptoms during standing and walking for short distances that is slightly relieved with sitting. Pt's  lumbar range of motion is limited and painful with the strongest pain report into flexion. Today's assessment limited due to time constraints and pt's pain severity. Based on ROM assessment pt demonstrated extension bias with report of LLE pain improvement, however based on irritability full assessment was precluded. Pt will benefit from physical therapy to reduce pain, increase range of motion, and increase tolerance to activity to improve ADL independence and increase quality of life.    Personal Factors and Comorbidities Comorbidity 3+    Comorbidities HTN, hx of CVA & MI, gout    Examination-Activity Limitations Stairs;Stand;Sleep;Locomotion Level    Examination-Participation Restrictions [place]    Stability/Clinical Decision Making Unstable/Unpredictable    Clinical Decision Making High    Rehab Potential Fair    PT Frequency 2x / week    PT Duration 6 weeks    PT Treatment/Interventions ADLs/Self Care Home Management;Cryotherapy;Electrical Stimulation;Ultrasound;Traction;Moist Heat;Iontophoresis 63m/ml Dexamethasone;Gait training;Stair training;Functional mobility training;Therapeutic activities;Therapeutic exercise;Balance training;Neuromuscular re-education;Manual techniques;Patient/family education;Passive range of motion;Dry needling;Energy conservation;Joint Manipulations;Spinal Manipulations;Taping    PT Next Visit Plan Review FOTO score, take vitals, assess benefit of repeated lumbar extensions, subjective: home  environment/ DME/family help, consider exercise bike, assess cervical spineand update goals PRN    PT Home Exercise Plan [email or web address] - LTRs, prone press ups (5s hold), supine piriformis stretch    Consulted and Agree with Plan of Care Patient           Patient will benefit from skilled therapeutic intervention in order to improve the following deficits and impairments:  Abnormal gait, Decreased activity tolerance, Decreased balance, Decreased endurance, Decreased range of motion, Decreased strength, Hypomobility, Difficulty walking, Impaired perceived functional ability, Impaired sensation, Postural dysfunction, Pain  Visit Diagnosis: Left-sided low back pain with left-sided sciatica, unspecified chronicity  Muscle weakness (generalized)  Cervicalgia  Other abnormalities of gait and mobility     Problem List Patient Active Problem List   Diagnosis Date Noted  . Cervical radiculopathy 05/27/2020  . Lumbar radiculopathy 05/27/2020  . Chronic gout involving toe of left foot without tophus 10/28/2018  . Coronary artery disease involving native heart with angina pectoris ([place] 12/15/2017  . Hyperlipidemia 12/10/2017  . Neck pain 12/10/2017  . Radicular pain in right arm 12/10/2017  . Dyspnea on exertion 12/10/2017  . History of myocardial infarction 11/19/2017  . History of stroke 11/19/2017  . Heart disease 11/19/2017  . History of gout 11/19/2017  . Vaccine counseling 11/19/2017  . Need for Tdap vaccination 11/19/2017  . Insomnia 11/19/2017  . Essential hypertension 10/19/2017  . Family history of hypertension 10/19/2017  . Family history of prostate cancer 10/19/2017  . Family history of colon cancer 10/19/2017  . Family history of diabetes mellitus 10/19/2017  . Encounter for hepatitis C screening test for low risk patient 10/19/2017  . 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 Right hemiplegia ([place] 05/31/2014  . Atypical chest pain 05/31/2014    LDawayne Cirri  SPT 06/13/2020, 12:51 PM  [facility] [address]Phone: [phone]  Fax:  [phone] Name: NHARON BEILKEMRN: [id number] of Birth: 321-May-1967    PHYSICAL THERAPY DISCHARGE SUMMARY  Visits from Start of Care: 1  Current functional level related to goals / functional outcomes: See goals   Remaining deficits: Current status unknown   Education / Equipment: HEP  Plan: Patient agrees to discharge.  Patient goals were not met. Patient is being discharged due to not returning since the last visit.  ?????  Kristoffer Leamon PT, DPT, LAT, ATC  07/23/20  2:03 PM

## 2020-06-15 ENCOUNTER — Ambulatory Visit (INDEPENDENT_AMBULATORY_CARE_PROVIDER_SITE_OTHER): Payer: No Typology Code available for payment source

## 2020-06-15 ENCOUNTER — Other Ambulatory Visit: Payer: Self-pay

## 2020-06-15 DIAGNOSIS — M5126 Other intervertebral disc displacement, lumbar region: Secondary | ICD-10-CM

## 2020-06-15 DIAGNOSIS — M5136 Other intervertebral disc degeneration, lumbar region: Secondary | ICD-10-CM

## 2020-06-15 DIAGNOSIS — M5416 Radiculopathy, lumbar region: Secondary | ICD-10-CM

## 2020-06-18 ENCOUNTER — Telehealth (INDEPENDENT_AMBULATORY_CARE_PROVIDER_SITE_OTHER): Payer: No Typology Code available for payment source | Admitting: Family Medicine

## 2020-06-18 ENCOUNTER — Other Ambulatory Visit: Payer: Self-pay

## 2020-06-18 DIAGNOSIS — M48061 Spinal stenosis, lumbar region without neurogenic claudication: Secondary | ICD-10-CM | POA: Diagnosis not present

## 2020-06-18 DIAGNOSIS — M5416 Radiculopathy, lumbar region: Secondary | ICD-10-CM

## 2020-06-18 NOTE — Progress Notes (Signed)
Virtual Visit via Telephone Note  I connected with Brian Velez on 06/18/20 at  8:00 AM EST by telephone and verified that I am speaking with the correct person using two identifiers.  Location: Patient: home Provider: office    I discussed the limitations, risks, security and privacy concerns of performing an evaluation and management service by telephone and the availability of in person appointments. I also discussed with the patient that there may be a patient responsible charge related to this service. The patient expressed understanding and agreed to proceed.   History of Present Illness:   Mr. Brian Velez is a 54 year old male that is following up after the MRI of his lumbar spine.  This was revealing for moderate bilateral neuroforaminal narrowing at L5-S1 as well as severe at L4-L5.  He does have facet hypertrophy as well.  He gets pain with standing up straight and his leg feels weak when he walks.  Observations/Objective:   Assessment and Plan:  Lumbar radiculopathy: He has foraminal stenosis at different levels.  He also has facet hypertrophy. -Counseled on supportive care. -Epidural. -Could consider facet injections.   Follow Up Instructions:    I discussed the assessment and treatment plan with the patient. The patient was provided an opportunity to ask questions and all were answered. The patient agreed with the plan and demonstrated an understanding of the instructions.   The patient was advised to call back or seek an in-person evaluation if the symptoms worsen or if the condition fails to improve as anticipated.  I provided 5 minutes of non-face-to-face time during this encounter.   Brian Gandy, MD

## 2020-06-18 NOTE — Assessment & Plan Note (Signed)
He has foraminal stenosis at different levels.  He also has facet hypertrophy. -Counseled on supportive care. -Epidural. -Could consider facet injections.

## 2020-06-19 ENCOUNTER — Encounter: Payer: Self-pay | Admitting: Family Medicine

## 2020-06-19 ENCOUNTER — Ambulatory Visit (INDEPENDENT_AMBULATORY_CARE_PROVIDER_SITE_OTHER): Payer: No Typology Code available for payment source | Admitting: Family Medicine

## 2020-06-19 ENCOUNTER — Other Ambulatory Visit: Payer: Self-pay

## 2020-06-19 VITALS — BP 120/84 | HR 67 | Temp 98.2°F | Ht 68.0 in | Wt 195.0 lb

## 2020-06-19 DIAGNOSIS — R29898 Other symptoms and signs involving the musculoskeletal system: Secondary | ICD-10-CM | POA: Diagnosis not present

## 2020-06-19 DIAGNOSIS — M5416 Radiculopathy, lumbar region: Secondary | ICD-10-CM

## 2020-06-19 MED ORDER — TRAZODONE HCL 100 MG PO TABS: 100.0000 mg | ORAL_TABLET | Freq: Every day | ORAL | 2 refills | Status: DC

## 2020-06-19 NOTE — Progress Notes (Signed)
Chief Complaint  Patient presents with  . Fall    Subjective: Patient is a 54 y.o. male here for a fall.  Patient has a history of lumbar radiculopathy with a recent MRI showing severe foraminal stenosis.  There is also some evidence of degenerative changes and slight disc bulging.  Today, he started experiencing left lower extremity weakness.  He fell twice due to weakness in his left lower extremity.  He did not hit his head or lose consciousness.  The fall was not related to pain, though that is unchanged.  The sports medicine team has arranged for an epidural injection but he is not aware of a timeframe for this.  His brother had a very similar incident happened to him where he required a neurosurgical procedure.  He is interested in getting a consultation.  Past Medical History:  Diagnosis Date  . History of gout   . Hypertension   . MI (myocardial infarction) (HCC) 2004  . Sleep apnea    "don't wear mask anymore"  . Stroke (HCC) 05/27/2014   "right side still weaker" (05/31/2014)    Objective: BP 120/84 (BP Location: Left Arm, Patient Position: Sitting, Cuff Size: Normal)   Pulse 67   Temp 98.2 F (36.8 C) (Oral)   Ht 5\' 8"  (1.727 m)   Wt 195 lb (88.5 kg)   SpO2 97%   BMI 29.65 kg/m  General: Awake, appears stated age MSK: No tenderness to palpation over the lumbar spine over the midline or paraspinal/erector spinae musculature, no tenderness over the SI joints Neuro: Patellar reflex 1/4 on the left, 2/4 on the right; no clonus, no cerebellar signs, antalgic gait, 4/5 hip flexion on the left secondary to pain, 4/5 knee extension/flexion on the left; 5/5 strength in the right lower extremity; negative straight leg bilaterally Lungs: No accessory muscle use Psych: Age appropriate judgment and insight, normal affect and mood  Assessment and Plan: Lumbar radiculopathy - Plan: Ambulatory referral to Neurosurgery  Weakness of left lower extremity - Plan: Ambulatory referral to  Neurosurgery  Continue medical management and reach out to the sports medicine team to find out more about the epidural injection.  Due to the new onset weakness and family history, I will refer him to the neurosurgery team for their opinion. The patient voiced understanding and agreement to the plan.  [place], DO 06/19/20  2:48 PM

## 2020-06-19 NOTE — Patient Instructions (Addendum)
If you do not hear anything about your referral in the next week or so, call our office and ask for an update.  Call Dr. Cyndia Diver office, [phone], regarding the logistics of your injection.  Let us know if you need anything.

## 2020-06-20 ENCOUNTER — Encounter: Payer: Self-pay | Admitting: Family Medicine

## 2020-06-27 ENCOUNTER — Ambulatory Visit
Admission: RE | Admit: 2020-06-27 | Discharge: 2020-06-27 | Disposition: A | Discharge status: Home / Self Care | Payer: No Typology Code available for payment source | Source: Ambulatory Visit | Attending: Family Medicine | Admitting: Family Medicine

## 2020-06-27 ENCOUNTER — Other Ambulatory Visit: Payer: Self-pay

## 2020-06-27 DIAGNOSIS — M5416 Radiculopathy, lumbar region: Secondary | ICD-10-CM

## 2020-06-27 MED ORDER — IOPAMIDOL (ISOVUE-M 200) INJECTION 41%
1.0000 mL | Freq: Once | INTRAMUSCULAR | Status: AC
  Administered 2020-06-27: 1 mL via EPIDURAL

## 2020-06-27 MED ORDER — METHYLPREDNISOLONE ACETATE 40 MG/ML INJ SUSP (RADIOLOG
120.0000 mg | Freq: Once | INTRAMUSCULAR | Status: AC
  Administered 2020-06-27: 120 mg via EPIDURAL

## 2020-06-27 NOTE — Discharge Instructions (Signed)

## 2020-07-02 ENCOUNTER — Ambulatory Visit: Payer: No Typology Code available for payment source | Admitting: Physical Therapy

## 2020-07-02 ENCOUNTER — Telehealth: Payer: Self-pay | Admitting: Physical Therapy

## 2020-07-02 NOTE — Telephone Encounter (Signed)
Spoke with pt regarding missed appointment today and he stated he had someone call to cancel his appointment. I apologized for the drop in communication and I noted when his next scheduled appointment is and he stated he plans to attend.    Brian Velez PT, DPT, LAT, ATC  07/02/20  12:21 PM

## 2020-07-08 ENCOUNTER — Ambulatory Visit: Payer: No Typology Code available for payment source | Admitting: Physical Therapy

## 2020-07-10 ENCOUNTER — Encounter: Payer: No Typology Code available for payment source | Admitting: Physical Therapy

## 2020-07-16 ENCOUNTER — Encounter: Payer: No Typology Code available for payment source | Admitting: Physical Therapy

## 2020-07-18 ENCOUNTER — Encounter: Payer: No Typology Code available for payment source | Admitting: Physical Therapy

## 2020-07-22 ENCOUNTER — Encounter: Payer: No Typology Code available for payment source | Admitting: Physical Therapy

## 2020-07-24 ENCOUNTER — Encounter: Payer: No Typology Code available for payment source | Admitting: Physical Therapy

## 2020-07-31 ENCOUNTER — Encounter: Payer: Self-pay | Admitting: Family Medicine

## 2020-07-31 DIAGNOSIS — M5416 Radiculopathy, lumbar region: Secondary | ICD-10-CM

## 2020-08-02 DIAGNOSIS — M4802 Spinal stenosis, cervical region: Secondary | ICD-10-CM

## 2020-08-02 DIAGNOSIS — M79605 Pain in left leg: Secondary | ICD-10-CM

## 2020-08-05 ENCOUNTER — Other Ambulatory Visit: Payer: Self-pay | Admitting: Family Medicine

## 2020-08-05 DIAGNOSIS — M5416 Radiculopathy, lumbar region: Secondary | ICD-10-CM

## 2020-08-06 ENCOUNTER — Other Ambulatory Visit: Payer: Self-pay | Admitting: Family Medicine

## 2020-08-06 MED ORDER — HYDROCODONE-ACETAMINOPHEN 5-325 MG PO TABS: 1.0000 | ORAL_TABLET | Freq: Three times a day (TID) | ORAL | 0 refills | Status: DC | PRN

## 2020-08-06 NOTE — Progress Notes (Signed)
Provided norco for severe pain.   Myra Rude, MD [facility] 08/06/2020, 9:00 AM

## 2020-08-08 ENCOUNTER — Other Ambulatory Visit: Payer: Self-pay | Admitting: Family Medicine

## 2020-08-08 ENCOUNTER — Ambulatory Visit
Admission: RE | Admit: 2020-08-08 | Discharge: 2020-08-08 | Disposition: A | Discharge status: Home / Self Care | Payer: No Typology Code available for payment source | Source: Ambulatory Visit | Attending: Family Medicine | Admitting: Family Medicine

## 2020-08-08 ENCOUNTER — Other Ambulatory Visit: Payer: Self-pay

## 2020-08-08 DIAGNOSIS — M5416 Radiculopathy, lumbar region: Secondary | ICD-10-CM

## 2020-08-08 MED ORDER — METHYLPREDNISOLONE ACETATE 40 MG/ML INJ SUSP (RADIOLOG
120.0000 mg | Freq: Once | INTRAMUSCULAR | Status: AC
  Administered 2020-08-08: 120 mg via EPIDURAL

## 2020-08-08 MED ORDER — IOPAMIDOL (ISOVUE-M 200) INJECTION 41%
1.0000 mL | Freq: Once | INTRAMUSCULAR | Status: AC
  Administered 2020-08-08: 1 mL via EPIDURAL

## 2020-08-08 NOTE — Discharge Instructions (Signed)

## 2020-08-12 MED ORDER — GABAPENTIN 300 MG PO CAPS: ORAL_CAPSULE | ORAL | 3 refills | Status: DC

## 2020-08-13 ENCOUNTER — Other Ambulatory Visit: Payer: No Typology Code available for payment source

## 2020-08-23 ENCOUNTER — Other Ambulatory Visit: Payer: Self-pay

## 2020-08-23 ENCOUNTER — Ambulatory Visit (HOSPITAL_COMMUNITY)
Admission: RE | Admit: 2020-08-23 | Discharge: 2020-08-23 | Disposition: A | Discharge status: Home / Self Care | Payer: No Typology Code available for payment source | Source: Ambulatory Visit | Attending: Neurosurgery | Admitting: Neurosurgery

## 2020-08-23 ENCOUNTER — Other Ambulatory Visit (HOSPITAL_COMMUNITY): Payer: Self-pay | Admitting: Neurosurgery

## 2020-08-23 ENCOUNTER — Other Ambulatory Visit: Payer: Self-pay | Admitting: Neurosurgery

## 2020-08-23 DIAGNOSIS — M5126 Other intervertebral disc displacement, lumbar region: Secondary | ICD-10-CM | POA: Diagnosis present

## 2020-08-23 MED ORDER — GADOBUTROL 1 MMOL/ML IV SOLN
7.5000 mL | Freq: Once | INTRAVENOUS | Status: AC | PRN
  Administered 2020-08-23: 7.5 mL via INTRAVENOUS

## 2020-09-09 DIAGNOSIS — I25119 Atherosclerotic heart disease of native coronary artery with unspecified angina pectoris: Secondary | ICD-10-CM

## 2020-09-09 MED ORDER — CARVEDILOL 12.5 MG PO TABS: 12.5000 mg | ORAL_TABLET | Freq: Two times a day (BID) | ORAL | 2 refills | Status: DC

## 2020-09-09 MED ORDER — AMLODIPINE BESYLATE 5 MG PO TABS: 5.0000 mg | ORAL_TABLET | Freq: Every day | ORAL | 3 refills | Status: DC

## 2020-09-10 ENCOUNTER — Other Ambulatory Visit: Payer: Self-pay | Admitting: Family Medicine

## 2020-09-10 DIAGNOSIS — I25119 Atherosclerotic heart disease of native coronary artery with unspecified angina pectoris: Secondary | ICD-10-CM

## 2020-09-10 MED ORDER — ATORVASTATIN CALCIUM 40 MG PO TABS: 40.0000 mg | ORAL_TABLET | Freq: Every day | ORAL | 2 refills | Status: DC

## 2020-09-25 ENCOUNTER — Other Ambulatory Visit: Payer: Self-pay | Admitting: Family Medicine

## 2020-09-25 DIAGNOSIS — M5416 Radiculopathy, lumbar region: Secondary | ICD-10-CM

## 2020-11-07 ENCOUNTER — Other Ambulatory Visit: Payer: Self-pay | Admitting: *Deleted

## 2020-11-07 ENCOUNTER — Telehealth: Payer: Self-pay | Admitting: *Deleted

## 2020-11-07 DIAGNOSIS — I25119 Atherosclerotic heart disease of native coronary artery with unspecified angina pectoris: Secondary | ICD-10-CM

## 2020-11-07 MED ORDER — AMLODIPINE BESYLATE 5 MG PO TABS: 5.0000 mg | ORAL_TABLET | Freq: Every day | ORAL | 0 refills | Status: DC

## 2020-11-07 MED ORDER — CARVEDILOL 12.5 MG PO TABS: 12.5000 mg | ORAL_TABLET | Freq: Two times a day (BID) | ORAL | 0 refills | Status: DC

## 2020-11-07 NOTE — Telephone Encounter (Signed)
Refills done.

## 2020-11-18 ENCOUNTER — Other Ambulatory Visit: Payer: Self-pay

## 2020-11-18 ENCOUNTER — Emergency Department (HOSPITAL_COMMUNITY)
Admission: EM | Admit: 2020-11-18 | Discharge: 2020-11-18 | Discharge status: Against Medical Advice | Payer: No Typology Code available for payment source | Attending: Emergency Medicine | Admitting: Emergency Medicine

## 2020-11-18 ENCOUNTER — Telehealth: Payer: Self-pay

## 2020-11-18 ENCOUNTER — Emergency Department (HOSPITAL_COMMUNITY): Payer: No Typology Code available for payment source

## 2020-11-18 DIAGNOSIS — Z7982 Long term (current) use of aspirin: Secondary | ICD-10-CM | POA: Insufficient documentation

## 2020-11-18 DIAGNOSIS — I10 Essential (primary) hypertension: Secondary | ICD-10-CM | POA: Diagnosis not present

## 2020-11-18 DIAGNOSIS — I2510 Atherosclerotic heart disease of native coronary artery without angina pectoris: Secondary | ICD-10-CM | POA: Diagnosis not present

## 2020-11-18 DIAGNOSIS — Z79899 Other long term (current) drug therapy: Secondary | ICD-10-CM | POA: Diagnosis not present

## 2020-11-18 DIAGNOSIS — R479 Unspecified speech disturbances: Secondary | ICD-10-CM | POA: Insufficient documentation

## 2020-11-18 DIAGNOSIS — R519 Headache, unspecified: Secondary | ICD-10-CM | POA: Diagnosis not present

## 2020-11-18 DIAGNOSIS — R202 Paresthesia of skin: Secondary | ICD-10-CM | POA: Insufficient documentation

## 2020-11-18 LAB — COMPREHENSIVE METABOLIC PANEL
ALT: 15 U/L (ref 0–44)
AST: 21 U/L (ref 15–41)
Albumin: 3.7 g/dL (ref 3.5–5.0)
Alkaline Phosphatase: 67 U/L (ref 38–126)
Anion gap: 6 (ref 5–15)
BUN: 11 mg/dL (ref 6–20)
CO2: 28 mmol/L (ref 22–32)
Calcium: 8.9 mg/dL (ref 8.9–10.3)
Chloride: 104 mmol/L (ref 98–111)
Creatinine, Ser: 1.16 mg/dL (ref 0.61–1.24)
GFR, Estimated: >60 mL/min (ref >60)
Glucose, Bld: 93 mg/dL (ref 70–99)
Potassium: 4.3 mmol/L (ref 3.5–5.1)
Sodium: 138 mmol/L (ref 135–145)
Total Bilirubin: 1 mg/dL (ref 0.3–1.2)
Total Protein: 6.6 g/dL (ref 6.5–8.1)

## 2020-11-18 LAB — DIFFERENTIAL
Abs Immature Granulocytes: 0.02 10*3/uL (ref 0.00–0.07)
Basophils Absolute: 0 10*3/uL (ref 0.0–0.1)
Basophils Relative: 1 %
Eosinophils Absolute: 0.2 10*3/uL (ref 0.0–0.5)
Eosinophils Relative: 3 %
Immature Granulocytes: 0 %
Lymphocytes Relative: 23 %
Lymphs Abs: 1.5 10*3/uL (ref 0.7–4.0)
Monocytes Absolute: 0.5 10*3/uL (ref 0.1–1.0)
Monocytes Relative: 8 %
Neutro Abs: 4.3 10*3/uL (ref 1.7–7.7)
Neutrophils Relative %: 65 %

## 2020-11-18 LAB — CBC
HCT: 40.8 % (ref 39.0–52.0)
Hemoglobin: 13.2 g/dL (ref 13.0–17.0)
MCH: 28.6 pg (ref 26.0–34.0)
MCHC: 32.4 g/dL (ref 30.0–36.0)
MCV: 88.5 fL (ref 80.0–100.0)
Platelets: 273 10*3/uL (ref 150–400)
RBC: 4.61 MIL/uL (ref 4.22–5.81)
RDW: 13.2 % (ref 11.5–15.5)
WBC: 6.5 10*3/uL (ref 4.0–10.5)
nRBC: 0 % (ref 0.0–0.2)

## 2020-11-18 LAB — I-STAT CHEM 8, ED
BUN: 12 mg/dL (ref 6–20)
Calcium, Ion: 0.97 mmol/L — ABNORMAL LOW (ref 1.15–1.40)
Chloride: 106 mmol/L (ref 98–111)
Creatinine, Ser: 1.1 mg/dL (ref 0.61–1.24)
Glucose, Bld: 92 mg/dL (ref 70–99)
HCT: 39 % (ref 39.0–52.0)
Hemoglobin: 13.3 g/dL (ref 13.0–17.0)
Potassium: 4.3 mmol/L (ref 3.5–5.1)
Sodium: 139 mmol/L (ref 135–145)
TCO2: 25 mmol/L (ref 22–32)

## 2020-11-18 MED ORDER — SODIUM CHLORIDE 0.9% FLUSH
3.0000 mL | Freq: Once | INTRAVENOUS | Status: DC
Start: 2020-11-18 — End: 2020-11-18

## 2020-11-18 NOTE — ED Triage Notes (Signed)
Pt from home for eval of headache and tingling in R hand since yesterday morning, unable to provide specific time. Reports trying to call his daughter yesterday around 1430 but couldn't remember her name. Memory returned after approx 2 hours, but headache and tingling remains today. Compliant with blood pressure medication but reports hypertension yesterday.

## 2020-11-18 NOTE — ED Provider Notes (Signed)
[facility] EMERGENCY DEPARTMENT Provider Note   CSN: [id number] Arrival date & time: 11/18/20  1045     History Chief Complaint  Patient presents with  . Headache  . Hypertension    Brian Velez is a 55 y.o. male.  HPI Patient presents headache right hand tingling and some memory issues.  States that yesterday at church he developed a dull headache.  States his right hand was tingling.  States however he had difficulty remembering his daughter's name at the time.  Now that his back.  Was gone from 2 hours.  Still has a headache.  Still has tingling in the right arm.  States it feels as if it is asleep but he slept better.  States he is able to use it freely.  No lower extremity involvement.  No chest pain.  No vision changes.  Does not tend to get headaches.    Past Medical History:  Diagnosis Date  . History of gout   . Hypertension   . MI (myocardial infarction) (HCC) 2004  . Sleep apnea    "don't wear mask anymore"  . Stroke [facility]) 05/27/2014   "right side still weaker" (05/31/2014)    Patient Active Problem List   Diagnosis Date Noted  . Cervical radiculopathy 05/27/2020  . Lumbar radiculopathy 05/27/2020  . Chronic gout involving toe of left foot without tophus 10/28/2018  . 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 Hyperlipidemia 12/10/2017  . Neck pain 12/10/2017  . Radicular pain in right arm 12/10/2017  . Dyspnea on exertion 12/10/2017  . History of myocardial infarction 11/19/2017  . History of stroke 11/19/2017  . Heart disease 11/19/2017  . History of gout 11/19/2017  . Vaccine counseling 11/19/2017  . Need for Tdap vaccination 11/19/2017  . Insomnia 11/19/2017  . Essential hypertension 10/19/2017  . Family history of hypertension 10/19/2017  . Family history of prostate cancer 10/19/2017  . Family history of colon cancer 10/19/2017  . Family history of diabetes mellitus 10/19/2017  .  Encounter for hepatitis C screening test for low risk patient 10/19/2017  . 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 Right hemiplegia (HCC) 05/31/2014  . Atypical chest pain 05/31/2014    Past Surgical History:  Procedure Laterality Date  . ANKLE FRACTURE SURGERY Left 2000's  . CARDIAC CATHETERIZATION  2004; 2014  . HAND SURGERY     left palm and fingertip injury  . ROUX-EN-Y GASTRIC BYPASS  2013  . SKIN BIOPSY         Family History  Problem Relation Age of Onset  . Dementia Mother        died of dementia  . Breast cancer Mother   . Hypertension Mother   . Heart disease Mother   . Cancer Mother        breast  . Diabetes Mother   . CAD Father   . Stroke Father   . Prostate cancer Father 34  . Hypertension Father   . Diabetes Father   . Heart disease Father   . Colon cancer Father 76  . Diabetes type II Sister   . Hypertension Sister   . Diabetes Sister   . Diabetes type II Brother   . Hypertension Brother   . Diabetes Brother   . Colon polyps Brother   . Diabetes type II Sister   . Hypertension Sister   . Diabetes Sister   . Diabetes type II Sister   . Hypertension  Sister   . Stroke Brother   . Colon cancer Brother   . Colon cancer Paternal Uncle   . Colon cancer Paternal Uncle   . Prostate cancer Paternal Uncle   . Esophageal cancer Neg Hx   . Rectal cancer Neg Hx   . Stomach cancer Neg Hx     Social History   Tobacco Use  . Smoking status: Never Smoker  . Smokeless tobacco: Never Used  Vaping Use  . Vaping Use: Never used  Substance Use Topics  . Alcohol use: No  . Drug use: No    Home Medications Prior to Admission medications   Medication Sig Start Date End Date Taking? Authorizing Provider  amitriptyline (ELAVIL) 50 MG tablet TAKE 1 TABLET BY MOUTH EVERYDAY AT BEDTIME 09/25/20   Wendling, Jilda Roche, DO  amLODipine (NORVASC) 5 MG tablet Take 1 tablet (5 mg total) by mouth daily. 11/07/20   Sharlene Dory, DO  aspirin EC 81 MG  tablet Take 1 tablet (81 mg total) by mouth daily. Swallow whole. 02/05/20   Sharlene Dory, DO  atorvastatin (LIPITOR) 40 MG tablet Take 1 tablet (40 mg total) by mouth daily. 09/10/20   Sharlene Dory, DO  carvedilol (COREG) 12.5 MG tablet Take 1 tablet (12.5 mg total) by mouth 2 (two) times daily with a meal. 11/07/20   Wendling, Jilda Roche, DO  gabapentin (NEURONTIN) 300 MG capsule Take 2 capsules in the morning and afternoon. Take 3 in the evening. 08/12/20   Sharlene Dory, DO  HYDROcodone-acetaminophen (NORCO/VICODIN) 5-325 MG tablet Take 1 tablet by mouth every 8 (eight) hours as needed. 08/06/20   Myra Rude, MD  isosorbide mononitrate (IMDUR) 30 MG 24 hr tablet Take 1 tablet (30 mg total) by mouth daily. 02/13/20 06/03/20  Georgeanna Lea, MD  tamsulosin (FLOMAX) 0.4 MG CAPS capsule Take 1 capsule (0.4 mg total) by mouth daily. 05/14/20   Sharlene Dory, DO  traZODone (DESYREL) 100 MG tablet Take 1 tablet (100 mg total) by mouth at bedtime. 06/19/20   Sharlene Dory, DO    Allergies    Patient has no known allergies.  Review of Systems   Review of Systems  Constitutional: Negative for fatigue.  HENT: Negative for congestion.   Respiratory: Negative for chest tightness.   Gastrointestinal: Negative for abdominal pain.  Genitourinary: Negative for flank pain.  Musculoskeletal: Negative for back pain.  Neurological: Positive for headaches. Negative for dizziness and weakness.  Psychiatric/Behavioral: Negative for confusion.    Physical Exam Updated Vital Signs BP 140/83   Pulse 64   Temp 98.6 F (37 C) (Oral)   Resp 20   Ht 5\' 8"  (1.727 m)   Wt 86.2 kg   SpO2 99%   BMI 28.89 kg/m   Physical Exam Vitals and nursing note reviewed.  HENT:     Head: Atraumatic.  Eyes:     Extraocular Movements: Extraocular movements intact.     Pupils: Pupils are equal, round, and reactive to light.  Cardiovascular:     Rate and  Rhythm: Regular rhythm.  Abdominal:     Palpations: Abdomen is soft.  Musculoskeletal:     Cervical back: Neck supple.  Skin:    General: Skin is warm.  Neurological:     Mental Status: He is alert.     Cranial Nerves: No cranial nerve deficit.     Comments: Possible paresthesias right hand.  States sensation is there and normal.  States that hand  is tingling however.  Psychiatric:        Mood and Affect: Mood normal.     ED Results / Procedures / Treatments   Labs (all labs ordered are listed, but only abnormal results are displayed) Labs Reviewed  I-STAT CHEM 8, ED - Abnormal; Notable for the following components:      Result Value   Calcium, Ion 0.97 (*)    All other components within normal limits  CBC  DIFFERENTIAL  COMPREHENSIVE METABOLIC PANEL  PROTIME-INR  APTT    EKG EKG Interpretation  Date/Time:  Monday November 18 2020 10:52:49 EDT Ventricular Rate:  75 PR Interval:  164 QRS Duration: 136 QT Interval:  396 QTC Calculation: 442 R Axis:   93 Text Interpretation: Normal sinus rhythm Right bundle branch block Abnormal ECG No significant change since last tracing Confirmed by Benjiman Core [phone]) on 11/18/2020 12:41:03 PM   Radiology No results found.  Procedures Procedures   Medications Ordered in ED Medications  sodium chloride flush (NS) 0.9 % injection 3 mL (3 mLs Intravenous Not Given 11/18/20 1220)    ED Course  I have reviewed the triage vital signs and the nursing notes.  Pertinent labs & imaging results that were available during my care of the patient were reviewed by me and considered in my medical decision making (see chart for details).    MDM Rules/Calculators/A&P                          Patient presented with headache and difficulty speaking yesterday.  Just had more trouble remembering his daughter's name.  Has tingling in right arm.  Good strength and nonfocal exam except for the paresthesias in the hand.  Appears of had  somewhat similar symptoms a couple years ago with negative MRIs at that time although may have had previous stroke also.  Discussed with Dr. Amada Jupiter from neurology.  We decided of her going to get an MRI to evaluate for acute stroke.  Also thought to be potentially complicated migraine.  However patient later was seen leaving the ER.  Nursing disc talked with him that he was not willing to stay for me to discuss with him.  Left AMA. Final Clinical Impression(s) / ED Diagnoses Final diagnoses:  Nonintractable headache, unspecified chronicity pattern, unspecified headache type    Rx / DC Orders ED Discharge Orders    None       Benjiman Core, MD 11/18/20 1405

## 2020-11-18 NOTE — ED Notes (Signed)
Went to check on pt and pt was coming out of his room. Pt said he was leaving. Tried to explain to pt that he needed to stay to have his MRI to rule out stroke. Pt then said again, "I'm leaving". Asked pt if he would at least stay for Dr. Rubin Payor to talk to him and he said no and kept walking out the door. Pt w/ steady gait upon discharge. Unable to obtain VS d/t pt removing all of monitoring equipment and refusing to stay for me to obtain VS. Pt A&Ox4 upon departure. [place] MD notified.

## 2020-11-18 NOTE — ED Notes (Signed)
Per MRI, pt is the next 1 or 2 and they will get him w/in the hour

## 2020-11-18 NOTE — Telephone Encounter (Signed)
Nurse Assessment Nurse: Vaughan Browner, RN, Marchelle Folks Date/Time (Eastern Time): 11/18/2020 9:16:15 AM Confirm and document reason for call. If symptomatic, describe symptoms. ---caller states he has a headache and no history of migraines. bp 164/110 yesterday at church. Does the patient have any new or worsening symptoms? ---Yes Will a triage be completed? ---Yes Related visit to physician within the last 2 weeks? ---No Does the PT have any chronic conditions? (i.e. diabetes, asthma, this includes High risk factors for pregnancy, etc.) ---Yes List chronic conditions. ---HTN on meds Is this a behavioral health or substance abuse call? ---No Guidelines Guideline Title Affirmed Question Affirmed Notes Nurse Date/Time (Eastern Time) Headache Patient sounds very sick or weak to the triager [employer], RN, Marchelle Folks 11/18/2020 9:17:12 AM Disp. Time Lamount Cohen Time) Disposition Final User 11/18/2020 9:21:39 AM Go to ED Now (or PCP triage) Yes [employer], RN, Marchelle Folks PLEASE NOTE: All timestamps contained within this report are represented as [place] Standard Time. CONFIDENTIALTY NOTICE: This fax transmission is intended only for the addressee. It contains information that is legally privileged, confidential or otherwise protected from use or disclosure. If you are not the intended recipient, you are strictly prohibited from reviewing, disclosing, copying using or disseminating any of this information or taking any action in reliance on or regarding this information. If you have received this fax in error, please notify us immediately by telephone so that we can arrange for its return to [place]. Phone: [phone], Toll-Free: [phone], Fax: [phone] Page: 2 of 2 Call Id: [id number] Caller Disagree/Comply Comply Caller Understands Yes PreDisposition InappropriateToAsk Care Advice Given Per Guideline GO TO ED NOW (OR PCP TRIAGE): * IF NO PCP (PRIMARY CARE PROVIDER) SECOND-LEVEL TRIAGE: You need to be seen  within the next hour. Go to the ED/UCC at _____________ Hospital. Leave as soon as you can. CARE ADVICE given per Headache (Adult) guideline. Comments User: Jaclynn Major, RN Date/Time Lamount Cohen Time): 11/18/2020 9:22:59 AM patient stated yesterday at church when the headache started he was having trouble saying his daughter's name. he was wanting to call her but could not get his words together. that passed and now has no speech issues. Referrals GO TO FACILITY OTHER - SPECIFY

## 2020-11-18 NOTE — Telephone Encounter (Signed)
Patient went to ED today 11/18/2020

## 2020-11-20 ENCOUNTER — Ambulatory Visit: Payer: No Typology Code available for payment source | Admitting: Family Medicine

## 2020-11-20 DIAGNOSIS — Z0289 Encounter for other administrative examinations: Secondary | ICD-10-CM

## 2020-12-06 ENCOUNTER — Telehealth: Payer: Self-pay | Admitting: *Deleted

## 2020-12-06 ENCOUNTER — Encounter: Payer: Self-pay | Admitting: Family Medicine

## 2020-12-06 ENCOUNTER — Ambulatory Visit (INDEPENDENT_AMBULATORY_CARE_PROVIDER_SITE_OTHER): Payer: No Typology Code available for payment source | Admitting: Family Medicine

## 2020-12-06 ENCOUNTER — Other Ambulatory Visit: Payer: Self-pay

## 2020-12-06 VITALS — BP 132/84 | HR 58 | Temp 98.4°F | Ht 69.0 in | Wt 199.1 lb

## 2020-12-06 DIAGNOSIS — R1030 Lower abdominal pain, unspecified: Secondary | ICD-10-CM | POA: Diagnosis not present

## 2020-12-06 DIAGNOSIS — K625 Hemorrhage of anus and rectum: Secondary | ICD-10-CM | POA: Diagnosis not present

## 2020-12-06 DIAGNOSIS — I25119 Atherosclerotic heart disease of native coronary artery with unspecified angina pectoris: Secondary | ICD-10-CM

## 2020-12-06 LAB — CBC
HCT: 39.7 % (ref 39.0–52.0)
Hemoglobin: 13.2 g/dL (ref 13.0–17.0)
MCHC: 33.3 g/dL (ref 30.0–36.0)
MCV: 85.7 fl (ref 78.0–100.0)
Platelets: 265 10*3/uL (ref 150.0–400.0)
RBC: 4.64 Mil/uL (ref 4.22–5.81)
RDW: 14.3 % (ref 11.5–15.5)
WBC: 6.3 10*3/uL (ref 4.0–10.5)

## 2020-12-06 NOTE — Telephone Encounter (Signed)
FYI  Reviewed chart and saw where patient has not went to ED or made an appt anywhere.  Called patient and he stated that he went to [facility] but it was packed, so he went back home.  Advised that he come to [facility] ER to be evaluated and that it may be a wait but he has to wait.  Patient agreed to come to [facility] ER.

## 2020-12-06 NOTE — Progress Notes (Signed)
Chief Complaint  Patient presents with  . Blood In Stools  . Back Pain    Subjective: Patient is a 55 y.o. male here for blood in stool.  5 d of daily brbpr. He had in past, thought to be related to hemorrhoids. He is not sob, lightheaded or dizzy. No pain, itching, N/V. Feels like upset stomach in the lower abd region. +famhx of cc in father and 3 uncles. CCS in 12/2017 w Dr. Myrtie Neither, 5 yr f/u rec'd.   Past Medical History:  Diagnosis Date  . History of gout   . Hypertension   . MI (myocardial infarction) (HCC) 2004  . Sleep apnea    "don't wear mask anymore"  . Stroke (HCC) 05/27/2014   "right side still weaker" (05/31/2014)    Objective: BP 132/84 (BP Location: Left Arm, Patient Position: Sitting, Cuff Size: Normal)   Pulse (!) 58   Temp 98.4 F (36.9 C) (Oral)   Ht 5\' 9"  (1.753 m)   Wt 199 lb 2 oz (90.3 kg)   SpO2 99%   BMI 29.41 kg/m  General: Awake, appears stated age Abd: BS+, S, suprapubic ttp, ND Rectal: No ext lesions or signs of bleeding Heart: RRR Lungs: CTAB, no rales, wheezes or rhonchi. No accessory muscle use Psych: Age appropriate judgment and insight, normal affect and mood  Assessment and Plan: BRBPR (bright red blood per rectum) - Plan: Ambulatory referral to Gastroenterology, CBC  Lower abdominal pain  Coronary artery disease involving native coronary artery of native heart with angina pectoris (HCC), Chronic  New dx, uncertain prog. Ck labs. Refer back to GI. Warning signs and symptoms verbalized.  The patient voiced understanding and agreement to the plan.  [place], DO 12/06/20  12:03 PM

## 2020-12-06 NOTE — Patient Instructions (Signed)
If you do not hear anything about your referral in the next few business days, call our office and ask for an update.  Give [place] 2-3 business days to get the results of your labs back.   Stay hydrated.  Let us know if you need anything.

## 2020-12-06 NOTE — Telephone Encounter (Signed)
Called per PCP to schedule this morning. Scheduled at 11:30 today. Patient aware and is on his way

## 2020-12-06 NOTE — Telephone Encounter (Signed)
Who Is Calling Patient / Member / Family / Caregiver Call Type Triage / Clinical Relationship To Patient Self Return Phone Number [phone] (Primary) Chief Complaint Blood In Stool Reason for Call Symptomatic / Request for Health Information Initial Comment Caller states has had blood in their stool for three days. GOTO Facility Not Listed [facility] long Translation No Nurse Assessment Nurse: Stefano Gaul, RN, Dwana Curd Date/Time (Eastern Time): 12/06/2020 8:02:19 AM Confirm and document reason for call. If symptomatic, describe symptoms. ---Caller states he has had blood in his stool for 3 days. has blood every time he has a BM more than once daily. has lower abd pain and back pain that is constant.

## 2020-12-25 ENCOUNTER — Ambulatory Visit (INDEPENDENT_AMBULATORY_CARE_PROVIDER_SITE_OTHER): Payer: No Typology Code available for payment source | Admitting: Family Medicine

## 2020-12-25 ENCOUNTER — Other Ambulatory Visit: Payer: Self-pay

## 2020-12-25 ENCOUNTER — Encounter: Payer: Self-pay | Admitting: Family Medicine

## 2020-12-25 VITALS — BP 122/78 | HR 75 | Temp 97.8°F | Resp 18 | Ht 69.0 in | Wt 202.2 lb

## 2020-12-25 DIAGNOSIS — R972 Elevated prostate specific antigen [PSA]: Secondary | ICD-10-CM

## 2020-12-25 DIAGNOSIS — Z0000 Encounter for general adult medical examination without abnormal findings: Secondary | ICD-10-CM

## 2020-12-25 DIAGNOSIS — N401 Enlarged prostate with lower urinary tract symptoms: Secondary | ICD-10-CM

## 2020-12-25 DIAGNOSIS — Z125 Encounter for screening for malignant neoplasm of prostate: Secondary | ICD-10-CM

## 2020-12-25 DIAGNOSIS — Z23 Encounter for immunization: Secondary | ICD-10-CM | POA: Diagnosis not present

## 2020-12-25 DIAGNOSIS — K625 Hemorrhage of anus and rectum: Secondary | ICD-10-CM

## 2020-12-25 DIAGNOSIS — R3911 Hesitancy of micturition: Secondary | ICD-10-CM

## 2020-12-25 LAB — COMPREHENSIVE METABOLIC PANEL
ALT: 16 U/L (ref 0–53)
AST: 24 U/L (ref 0–37)
Albumin: 3.8 g/dL (ref 3.5–5.2)
Alkaline Phosphatase: 73 U/L (ref 39–117)
BUN: 10 mg/dL (ref 6–23)
CO2: 31 mEq/L (ref 19–32)
Calcium: 8.5 mg/dL (ref 8.4–10.5)
Chloride: 106 mEq/L (ref 96–112)
Creatinine, Ser: 1.03 mg/dL (ref 0.40–1.50)
GFR: 82.54 mL/min (ref >60.00)
Glucose, Bld: 90 mg/dL (ref 70–99)
Potassium: 4.2 mEq/L (ref 3.5–5.1)
Sodium: 142 mEq/L (ref 135–145)
Total Bilirubin: 1 mg/dL (ref 0.2–1.2)
Total Protein: 6.2 g/dL (ref 6.0–8.3)

## 2020-12-25 LAB — LIPID PANEL
Cholesterol: 112 mg/dL (ref 0–200)
HDL: 65.5 mg/dL (ref >39.00)
LDL Cholesterol: 40 mg/dL (ref 0–99)
NonHDL: 46.7
Total CHOL/HDL Ratio: 2
Triglycerides: 32 mg/dL (ref 0.0–149.0)
VLDL: 6.4 mg/dL (ref 0.0–40.0)

## 2020-12-25 LAB — CBC
HCT: 38.7 % — ABNORMAL LOW (ref 39.0–52.0)
Hemoglobin: 12.8 g/dL — ABNORMAL LOW (ref 13.0–17.0)
MCHC: 33.1 g/dL (ref 30.0–36.0)
MCV: 85.3 fl (ref 78.0–100.0)
Platelets: 272 10*3/uL (ref 150.0–400.0)
RBC: 4.53 Mil/uL (ref 4.22–5.81)
RDW: 14.2 % (ref 11.5–15.5)
WBC: 7 10*3/uL (ref 4.0–10.5)

## 2020-12-25 LAB — PSA: PSA: 3.12 ng/mL (ref 0.10–4.00)

## 2020-12-25 MED ORDER — TAMSULOSIN HCL 0.4 MG PO CAPS: 0.4000 mg | ORAL_CAPSULE | Freq: Every day | ORAL | 2 refills | Status: DC

## 2020-12-25 NOTE — Progress Notes (Signed)
Chief Complaint  Patient presents with  . Annual Exam    Pt states fasting. PHQ-9 score 16    Well Male Brian Velez is here for a complete physical.   His last physical was >1 year ago.  Current diet: in general, diet has been worse due to dental issues.  Current exercise: walking Weight trend: stable Fatigue out of ordinary? No. Seat belt? Yes.    Health maintenance Shingrix- No Colonoscopy- Yes Tetanus- Yes HIV- Yes Hep C- Yes   Past Medical History:  Diagnosis Date  . History of gout   . Hypertension   . MI (myocardial infarction) (HCC) 2004  . Sleep apnea    "don't wear mask anymore"  . Stroke [facility]) 05/27/2014   "right side still weaker" (05/31/2014)      Past Surgical History:  Procedure Laterality Date  . ANKLE FRACTURE SURGERY Left 2000's  . CARDIAC CATHETERIZATION  2004; 2014  . HAND SURGERY     left palm and fingertip injury  . ROUX-EN-Y GASTRIC BYPASS  2013  . SKIN BIOPSY      Medications  Current Outpatient Medications on File Prior to Visit  Medication Sig Dispense Refill  . amitriptyline (ELAVIL) 50 MG tablet TAKE 1 TABLET BY MOUTH EVERYDAY AT BEDTIME 30 tablet 3  . amLODipine (NORVASC) 5 MG tablet Take 1 tablet (5 mg total) by mouth daily. 90 tablet 0  . aspirin EC 81 MG tablet Take 1 tablet (81 mg total) by mouth daily. Swallow whole. 30 tablet 11  . atorvastatin (LIPITOR) 40 MG tablet Take 1 tablet (40 mg total) by mouth daily. 90 tablet 2  . carvedilol (COREG) 12.5 MG tablet Take 1 tablet (12.5 mg total) by mouth 2 (two) times daily with a meal. 180 tablet 0  . gabapentin (NEURONTIN) 300 MG capsule Take 2 capsules in the morning and afternoon. Take 3 in the evening. 210 capsule 3  . HYDROcodone-acetaminophen (NORCO/VICODIN) 5-325 MG tablet Take 1 tablet by mouth every 8 (eight) hours as needed. 15 tablet 0  . tamsulosin (FLOMAX) 0.4 MG CAPS capsule Take 1 capsule (0.4 mg total) by mouth daily. 30 capsule 3  . traZODone (DESYREL) 100 MG  tablet Take 1 tablet (100 mg total) by mouth at bedtime. 90 tablet 2  . isosorbide mononitrate (IMDUR) 30 MG 24 hr tablet Take 1 tablet (30 mg total) by mouth daily. 90 tablet 1   Allergies No Known Allergies  Family History Family History  Problem Relation Age of Onset  . Dementia Mother        died of dementia  . Breast cancer Mother   . Hypertension Mother   . Heart disease Mother   . Cancer Mother        breast  . Diabetes Mother   . CAD Father   . Stroke Father   . Prostate cancer Father 50  . Hypertension Father   . Diabetes Father   . Heart disease Father   . Colon cancer Father 73  . Diabetes type II Sister   . Hypertension Sister   . Diabetes Sister   . Diabetes type II Brother   . Hypertension Brother   . Diabetes Brother   . Colon polyps Brother   . Diabetes type II Sister   . Hypertension Sister   . Diabetes Sister   . Diabetes type II Sister   . Hypertension Sister   . Stroke Brother   . Colon cancer Brother   . Colon cancer  Paternal Uncle   . Colon cancer Paternal Uncle   . Prostate cancer Paternal Uncle   . Esophageal cancer Neg Hx   . Rectal cancer Neg Hx   . Stomach cancer Neg Hx     Review of Systems: Constitutional:  no fevers Eye:  no recent significant change in vision Ear/Nose/Mouth/Throat:  Ears:  no hearing loss Nose/Mouth/Throat:  no complaints of nasal congestion, no sore throat Cardiovascular:  no chest pain Respiratory:  no shortness of breath Gastrointestinal:  +rectal bleeding GU:  Male: negative for dysuria, frequency Musculoskeletal/Extremities:  No new joint pain Integumentary (Skin/Breast):  no abnormal skin lesions reported Neurologic:  no headaches Endocrine: No unexpected weight changes Hematologic/Lymphatic:  no abnormal bleeding  Exam BP 122/78 (BP Location: Left Arm, Patient Position: Sitting, Cuff Size: Normal)   Pulse 75   Temp 97.8 F (36.6 C) (Oral)   Resp 18   Ht 5\' 9"  (1.753 m)   Wt 202 lb 3.2 oz (91.7  kg)   SpO2 95%   BMI 29.86 kg/m  General:  well developed, well nourished, in no apparent distress Skin:  no significant moles, warts, or growths Head:  no masses, lesions, or tenderness Eyes:  pupils equal and round, sclera anicteric without injection Ears:  canals without lesions, TMs shiny without retraction, no obvious effusion, no erythema Nose:  nares patent, septum midline, mucosa normal Throat/Pharynx:  lips and gingiva without lesion; tongue and uvula midline; non-inflamed pharynx; no exudates or postnasal drainage Neck: neck supple without adenopathy, thyromegaly, or masses Cardiac: RRR, no bruits, no LE edema Lungs:  clear to auscultation, breath sounds equal bilaterally, no respiratory distress Abdomen: BS+, soft, non-tender, non-distended, no masses or organomegaly noted Rectal: Deferred Musculoskeletal:  symmetrical muscle groups noted without atrophy or deformity Neuro:  gait normal; deep tendon reflexes normal and symmetric Psych: well oriented with normal range of affect and appropriate judgment/insight  Assessment and Plan  Well adult exam - Plan: CBC, Comprehensive metabolic panel, Lipid panel  Benign prostatic hyperplasia with urinary hesitancy - Plan: tamsulosin (FLOMAX) 0.4 MG CAPS capsule  Screening for prostate cancer - Plan: PSA   Well 55 y.o. male. Counseled on diet and exercise. Counseled on risks and benefits of prostate cancer screening with PSA. The patient agrees to undergo testing. Pt w known hx of internal hemorrhoids, will have him reach out to GI again. If he wants a 2nd opinion, he will let me know.  Shingrix today.  Immunizations, labs, and further orders as above. Follow up in 6 mo. 2 mo for 2nd Shingrix.  The patient voiced understanding and agreement to the plan.  [address] [place], DO 12/25/20 9:25 AM

## 2020-12-25 NOTE — Addendum Note (Signed)
Addended by: Roxanne Gates on: 12/25/2020 09:31 AM   Modules accepted: Orders

## 2020-12-25 NOTE — Patient Instructions (Addendum)
Give [place] 2-3 business days to get the results of your labs back.   Keep the diet clean and stay active.  OK to use Debrox (peroxide) in the ear to loosen up wax. Also recommend using a bulb syringe (for removing boogers from baby's noses) to flush through warm water and vinegar (3-4:1 ratio). An alternative, though more expensive, is an elephant ear washer wax removal kit. Do not use Q-tips as this can impact wax further.  Call the GI team regarding your continuing issue with rectal bleeding. If you want me to refer you to another group to discuss a colonoscopy, please send me a message.   Let us know if you need anything.

## 2020-12-26 NOTE — Addendum Note (Signed)
Addended byConrad Saks D on: 12/26/2020 01:24 PM   Modules accepted: Orders

## 2021-01-09 ENCOUNTER — Other Ambulatory Visit: Payer: Self-pay | Admitting: Family Medicine

## 2021-01-09 DIAGNOSIS — M5416 Radiculopathy, lumbar region: Secondary | ICD-10-CM

## 2021-02-06 ENCOUNTER — Other Ambulatory Visit: Payer: No Typology Code available for payment source

## 2021-02-13 ENCOUNTER — Other Ambulatory Visit: Payer: Self-pay | Admitting: Family Medicine

## 2021-02-13 DIAGNOSIS — I25119 Atherosclerotic heart disease of native coronary artery with unspecified angina pectoris: Secondary | ICD-10-CM

## 2021-03-13 ENCOUNTER — Other Ambulatory Visit: Payer: Self-pay | Admitting: Family Medicine

## 2021-05-04 ENCOUNTER — Other Ambulatory Visit: Payer: Self-pay | Admitting: Family Medicine

## 2021-05-04 DIAGNOSIS — M5416 Radiculopathy, lumbar region: Secondary | ICD-10-CM

## 2021-05-05 NOTE — Telephone Encounter (Signed)
Pt wife has called to inquire about getting a DOT CPE done. I have informed her that we dont do them in our office. If the DOT provider needs additional information then we maybe able to help.   Pt wife stated understanding and will contact the people for the DOT. There was specific place that the employer stated the pt was to go to.   Please assist and confirm whether or not you do DOT CPEs

## 2021-05-07 ENCOUNTER — Other Ambulatory Visit: Payer: Self-pay | Admitting: Family Medicine

## 2021-05-07 DIAGNOSIS — M79605 Pain in left leg: Secondary | ICD-10-CM

## 2021-05-07 DIAGNOSIS — M4802 Spinal stenosis, cervical region: Secondary | ICD-10-CM

## 2021-05-10 ENCOUNTER — Other Ambulatory Visit: Payer: Self-pay | Admitting: Family Medicine

## 2021-05-10 DIAGNOSIS — I25119 Atherosclerotic heart disease of native coronary artery with unspecified angina pectoris: Secondary | ICD-10-CM

## 2021-06-10 ENCOUNTER — Other Ambulatory Visit: Payer: Self-pay | Admitting: Student

## 2021-06-10 DIAGNOSIS — M5412 Radiculopathy, cervical region: Secondary | ICD-10-CM

## 2021-06-11 ENCOUNTER — Other Ambulatory Visit: Payer: Self-pay | Admitting: Urology

## 2021-06-11 NOTE — Progress Notes (Signed)
Sent message, via epic in basket, requesting orders in epic from surgeon.  

## 2021-06-13 NOTE — Progress Notes (Signed)
PCP - Arva Chafe DO 12-06-20 epic Cardiologist - LOV Christella Hartigan, MD 02-13-20 epic  PPM/ICD -  Device Orders -  Rep Notified -   Chest x-ray -  EKG - 11-19-20 epic Stress Test - 03-05-20 ECHO - 03-05-20 Cardiac Cath -   Sleep Study -  CPAP -   Fasting Blood Sugar -  Checks Blood Sugar _____ times a day  Blood Thinner Instructions: Aspirin Instructions:  ERAS Protcol - PRE-SURGERY Ensure or G2-   COVID TEST- 06-20-21 COVID vaccine -  Activity-- Anesthesia review: HTN  Patient denies shortness of breath, fever, cough and chest pain at PAT appointment   All instructions explained to the patient, with a verbal understanding of the material. Patient agrees to go over the instructions while at home for a better understanding. Patient also instructed to self quarantine after being tested for COVID-19. The opportunity to ask questions was provided.

## 2021-06-13 NOTE — Patient Instructions (Addendum)
DUE TO COVID-19 ONLY ONE VISITOR IS ALLOWED TO COME WITH YOU AND STAY IN THE WAITING ROOM ONLY DURING PRE OP AND PROCEDURE DAY OF SURGERY.   Up to two visitors ages 16+ are allowed at one time in a patient's room.  The visitors may rotate out with other people throughout the day.  Additionally, up to two children between the ages of 5 and 29 are allowed and do not count toward the number of allowed visitors.  Children within this age range must be accompanied by an adult visitor.  One adult visitor may remain with the patient overnight and must be in the room by 8 PM.  YOU NEED TO HAVE A COVID 19 TEST ON___11-11-22____ between 8am-3pm______, THIS TEST MUST BE DONE BEFORE SURGERY,     Please bring completed form with you to the COVID testing site   COVID TESTING SITE [address] [place]   [phone]      ONCE YOUR COVID TEST IS COMPLETED,  PLEASE Wear a mask when in public           Your procedure is scheduled on: 06-24-21   Report to [facility] Main  Entrance   Report to admitting at       0700 AM     Call this number if you have problems the morning of surgery [phone]   Remember: Do not eat food :After Midnight. You may have clear liquids until       0615 am then nothing by mouth.    CLEAR LIQUID DIET                                                                    water Black Coffee and tea, regular and decaf No Creamer                            Plain Jell-O any favor except red or purple                                  Fruit ices (not with fruit pulp)                                      Iced Popsicles                                     Carbonated beverages, regular and diet                                    Cranberry, grape and apple juices  Sports drinks like Gatorade Lightly seasoned clear broth or consume(fat free) Sugar, honey syrup   _____________________________________________________________________      BRUSH YOUR TEETH  MORNING OF SURGERY AND RINSE YOUR MOUTH OUT, NO CHEWING GUM CANDY OR MINTS.     Take these medicines the morning of surgery with A SIP OF WATER: oxycodone if needed, carvedilol,  finasteride, amlodipine                                 You may not have any metal on your body including hair pins and              piercings  Do not wear jewelry, lotions, powders,perfumes,    or    deodorant                       Men may shave face and neck.   Do not bring valuables to the hospital. [facility] IS NOT             RESPONSIBLE   FOR VALUABLES.  Contacts, dentures or bridgework may not be worn into surgery.  You may bring a small overnight bag with you     Patients discharged the day of surgery will not be allowed to drive home. IF YOU ARE HAVING SURGERY AND GOING HOME THE SAME DAY, YOU MUST HAVE AN ADULT TO DRIVE YOU HOME AND BE WITH YOU FOR 24 HOURS. YOU MAY GO HOME BY TAXI OR UBER OR ORTHERWISE, BUT AN ADULT MUST ACCOMPANY YOU HOME AND STAY WITH YOU FOR 24 HOURS.  Name and phone number of your driver:  Special Instructions: N/A              Please read over the following fact sheets you were given: _____________________________________________________________________             [facility] - Preparing for Surgery Before surgery, you can play an important role.  Because skin is not sterile, your skin needs to be as free of germs as possible.  You can reduce the number of germs on your skin by washing with CHG (chlorahexidine gluconate) soap before surgery.  CHG is an antiseptic cleaner which kills germs and bonds with the skin to continue killing germs even after washing. Please DO NOT use if you have an allergy to CHG or antibacterial soaps.  If your skin becomes reddened/irritated stop using the CHG and inform your nurse when you arrive at Short Stay. Do not shave (including legs and underarms) for at least 48 hours prior to the first CHG shower.  You may shave your face/neck. Please  follow these instructions carefully:  1.  Shower with CHG Soap the night before surgery and the  morning of Surgery.  2.  If you choose to wash your hair, wash your hair first as usual with your  normal  shampoo.  3.  After you shampoo, rinse your hair and body thoroughly to remove the  shampoo.                           4.  Use CHG as you would any other liquid soap.  You can apply chg directly  to the skin and wash                       Gently with a scrungie or clean washcloth.  5.  Apply the CHG Soap to your body ONLY FROM THE NECK DOWN.   Do not use on face/ open                           Wound or open sores. Avoid contact with eyes, ears mouth  and genitals (private parts).                       Wash face,  Genitals (private parts) with your normal soap.             6.  Wash thoroughly, paying special attention to the area where your surgery  will be performed.  7.  Thoroughly rinse your body with warm water from the neck down.  8.  DO NOT shower/wash with your normal soap after using and rinsing off  the CHG Soap.                9.  Pat yourself dry with a clean towel.            10.  Wear clean pajamas.            11.  Place clean sheets on your bed the night of your first shower and do not  sleep with pets. Day of Surgery : Do not apply any lotions/deodorants the morning of surgery.  Please wear clean clothes to the hospital/surgery center.  FAILURE TO FOLLOW THESE INSTRUCTIONS MAY RESULT IN THE CANCELLATION OF YOUR SURGERY PATIENT SIGNATURE_________________________________  NURSE SIGNATURE__________________________________  ________________________________________________________________________

## 2021-06-16 ENCOUNTER — Other Ambulatory Visit: Payer: Self-pay | Admitting: Student

## 2021-06-16 ENCOUNTER — Other Ambulatory Visit: Payer: Self-pay

## 2021-06-16 ENCOUNTER — Ambulatory Visit
Admission: RE | Admit: 2021-06-16 | Discharge: 2021-06-16 | Disposition: A | Discharge status: Home / Self Care | Payer: Self-pay | Source: Ambulatory Visit | Attending: Student | Admitting: Student

## 2021-06-16 DIAGNOSIS — M5412 Radiculopathy, cervical region: Secondary | ICD-10-CM

## 2021-06-16 MED ORDER — ONDANSETRON HCL 4 MG/2ML IJ SOLN
4.0000 mg | Freq: Once | INTRAMUSCULAR | Status: AC | PRN
  Administered 2021-06-16: 4 mg via INTRAMUSCULAR

## 2021-06-16 MED ORDER — DIAZEPAM 5 MG PO TABS
10.0000 mg | ORAL_TABLET | Freq: Once | ORAL | Status: AC
  Administered 2021-06-16: 10 mg via ORAL

## 2021-06-16 MED ORDER — IOPAMIDOL (ISOVUE-M 300) INJECTION 61%
110.0000 mL | Freq: Once | INTRAMUSCULAR | Status: AC | PRN
  Administered 2021-06-16: 110 mL via INTRATHECAL

## 2021-06-16 MED ORDER — MEPERIDINE HCL 50 MG/ML IJ SOLN
50.0000 mg | Freq: Once | INTRAMUSCULAR | Status: AC | PRN
  Administered 2021-06-16: 50 mg via INTRAMUSCULAR

## 2021-06-16 NOTE — Progress Notes (Signed)
Anesthesia Review:  PCP: Cardiologist : Chest x-ray : EKG : 11/19/20  Echo :03/05/20  Stress test:03/05/20  Cardiac Cath :  Activity level:  Sleep Study/ CPAP : Fasting Blood Sugar :      / Checks Blood Sugar -- times a day:   Blood Thinner/ Instructions /Last Dose: ASA / Instructions/ Last Dose :

## 2021-06-16 NOTE — Progress Notes (Signed)
DUE TO COVID-19 ONLY ONE VISITOR IS ALLOWED TO COME WITH YOU AND STAY IN THE WAITING ROOM ONLY DURING PRE OP AND PROCEDURE DAY OF SURGERY. THE 1 VISITOR  MAY VISIT WITH YOU AFTER SURGERY IN YOUR PRIVATE ROOM DURING VISITING HOURS ONLY!  YOU NEED TO HAVE A COVID 19 TEST ON____11/06/2021 ___ @_______ , THIS TEST MUST BE DONE BEFORE SURGERY,  COVID TESTING SITE IS AT [address] [place]. PLEASE REMAIN IN YOUR CAR THIS IS A DRIVER UP TEST. AFTER YOUR COVID TEST PLEASE WEAR A MASK OUT IN PUBLIC AND SOCIAL DISTANCE AND WASH YOUR HANDS FREQUENTLY. PLEASE ASK ALL YOUR CLOSE CONTACTS TO WEAR A MASK OUT IN PUBLIC AND SOCIAL DISTANCE AND WASH HANDS FREQUENTLY ALSO.               Brian Velez  06/16/2021   Your procedure is scheduled on:    06/24/2021   Report to [facility] Main  Entrance   Report to admitting at      0700AM      Call this number if you have problems the morning of surgery [phone]    Remember: Do not eat food , candy gum or mints :After Midnight. You may have clear liquids from midnight until __   0615AM   CLEAR LIQUID DIET   Foods Allowed                                                                       Coffee and tea, regular and decaf                              Plain Jell-O any favor except red or purple                                            Fruit ices (not with fruit pulp)                                      Iced Popsicles                                     Carbonated beverages, regular and diet                                    Cranberry, grape and apple juices Sports drinks like Gatorade Lightly seasoned clear broth or consume(fat free) Sugar   _____________________________________________________________________    BRUSH YOUR TEETH MORNING OF SURGERY AND RINSE YOUR MOUTH OUT, NO CHEWING GUM CANDY OR MINTS.     Take these medicines the morning of surgery with A SIP OF WATER:  AMLODIPINE, COREG, PORSCAR, GABAPENTIN   DO  NOT TAKE ANY DIABETIC MEDICATIONS DAY OF YOUR SURGERY  You may not have any metal on your body including hair pins and              piercings  Do not wear jewelry, make-up, lotions, powders or perfumes, deodorant             Do not wear nail polish on your fingernails.  Do not shave  48 hours prior to surgery.              Men may shave face and neck.   Do not bring valuables to the hospital. [place].  Contacts, dentures or bridgework may not be worn into surgery.  Leave suitcase in the car. After surgery it may be brought to your room.     Patients discharged the day of surgery will not be allowed to drive home. IF YOU ARE HAVING SURGERY AND GOING HOME THE SAME DAY, YOU MUST HAVE AN ADULT TO DRIVE YOU HOME AND BE WITH YOU FOR 24 HOURS. YOU MAY GO HOME BY TAXI OR UBER OR ORTHERWISE, BUT AN ADULT MUST ACCOMPANY YOU HOME AND STAY WITH YOU FOR 24 HOURS.  Name and phone number of your driver:  Special Instructions: N/A              Please read over the following fact sheets you were given: _____________________________________________________________________  [facility] - Preparing for Surgery Before surgery, you can play an important role.  Because skin is not sterile, your skin needs to be as free of germs as possible.  You can reduce the number of germs on your skin by washing with CHG (chlorahexidine gluconate) soap before surgery.  CHG is an antiseptic cleaner which kills germs and bonds with the skin to continue killing germs even after washing. Please DO NOT use if you have an allergy to CHG or antibacterial soaps.  If your skin becomes reddened/irritated stop using the CHG and inform your nurse when you arrive at Short Stay. Do not shave (including legs and underarms) for at least 48 hours prior to the first CHG shower.  You may shave your face/neck. Please follow these instructions carefully:  1.  Shower  with CHG Soap the night before surgery and the  morning of Surgery.  2.  If you choose to wash your hair, wash your hair first as usual with your  normal  shampoo.  3.  After you shampoo, rinse your hair and body thoroughly to remove the  shampoo.                           4.  Use CHG as you would any other liquid soap.  You can apply chg directly  to the skin and wash                       Gently with a scrungie or clean washcloth.  5.  Apply the CHG Soap to your body ONLY FROM THE NECK DOWN.   Do not use on face/ open                           Wound or open sores. Avoid contact with eyes, ears mouth and genitals (private parts).  Wash face,  Genitals (private parts) with your normal soap.             6.  Wash thoroughly, paying special attention to the area where your surgery  will be performed.  7.  Thoroughly rinse your body with warm water from the neck down.  8.  DO NOT shower/wash with your normal soap after using and rinsing off  the CHG Soap.                9.  Pat yourself dry with a clean towel.            10.  Wear clean pajamas.            11.  Place clean sheets on your bed the night of your first shower and do not  sleep with pets. Day of Surgery : Do not apply any lotions/deodorants the morning of surgery.  Please wear clean clothes to the hospital/surgery center.  FAILURE TO FOLLOW THESE INSTRUCTIONS MAY RESULT IN THE CANCELLATION OF YOUR SURGERY PATIENT SIGNATURE_________________________________  NURSE SIGNATURE__________________________________  ________________________________________________________________________

## 2021-06-16 NOTE — Discharge Instructions (Signed)

## 2021-06-16 NOTE — Discharge Instr - Other Orders (Addendum)
1028: 8/10 pain noted post myelogram procedure, see MAR  1043: Relief reported

## 2021-06-17 ENCOUNTER — Encounter (HOSPITAL_COMMUNITY)
Admission: RE | Admit: 2021-06-17 | Discharge: 2021-06-17 | Disposition: A | Discharge status: Home / Self Care | Payer: Self-pay | Source: Ambulatory Visit | Attending: Anesthesiology | Admitting: Anesthesiology

## 2021-06-17 DIAGNOSIS — I10 Essential (primary) hypertension: Secondary | ICD-10-CM

## 2021-06-19 ENCOUNTER — Encounter (HOSPITAL_COMMUNITY): Payer: Self-pay

## 2021-06-19 ENCOUNTER — Encounter (HOSPITAL_COMMUNITY)
Admission: RE | Admit: 2021-06-19 | Discharge: 2021-06-19 | Disposition: A | Discharge status: Home / Self Care | Payer: Self-pay | Source: Ambulatory Visit | Attending: Urology | Admitting: Urology

## 2021-06-19 ENCOUNTER — Other Ambulatory Visit: Payer: Self-pay

## 2021-06-19 DIAGNOSIS — Z9884 Bariatric surgery status: Secondary | ICD-10-CM | POA: Insufficient documentation

## 2021-06-19 DIAGNOSIS — Z8673 Personal history of transient ischemic attack (TIA), and cerebral infarction without residual deficits: Secondary | ICD-10-CM | POA: Insufficient documentation

## 2021-06-19 DIAGNOSIS — I10 Essential (primary) hypertension: Secondary | ICD-10-CM | POA: Insufficient documentation

## 2021-06-19 DIAGNOSIS — N400 Enlarged prostate without lower urinary tract symptoms: Secondary | ICD-10-CM | POA: Insufficient documentation

## 2021-06-19 DIAGNOSIS — Z01812 Encounter for preprocedural laboratory examination: Secondary | ICD-10-CM | POA: Insufficient documentation

## 2021-06-19 DIAGNOSIS — G4730 Sleep apnea, unspecified: Secondary | ICD-10-CM | POA: Insufficient documentation

## 2021-06-19 LAB — CBC
HCT: 39.9 % (ref 39.0–52.0)
Hemoglobin: 13 g/dL (ref 13.0–17.0)
MCH: 28.6 pg (ref 26.0–34.0)
MCHC: 32.6 g/dL (ref 30.0–36.0)
MCV: 87.7 fL (ref 80.0–100.0)
Platelets: 329 10*3/uL (ref 150–400)
RBC: 4.55 MIL/uL (ref 4.22–5.81)
RDW: 13.7 % (ref 11.5–15.5)
WBC: 7.5 10*3/uL (ref 4.0–10.5)
nRBC: 0 % (ref 0.0–0.2)

## 2021-06-19 LAB — BASIC METABOLIC PANEL
Anion gap: 4 — ABNORMAL LOW (ref 5–15)
BUN: 14 mg/dL (ref 6–20)
CO2: 31 mmol/L (ref 22–32)
Calcium: 8.7 mg/dL — ABNORMAL LOW (ref 8.9–10.3)
Chloride: 104 mmol/L (ref 98–111)
Creatinine, Ser: 1.11 mg/dL (ref 0.61–1.24)
GFR, Estimated: >60 mL/min (ref >60)
Glucose, Bld: 90 mg/dL (ref 70–99)
Potassium: 4.2 mmol/L (ref 3.5–5.1)
Sodium: 139 mmol/L (ref 135–145)

## 2021-06-19 NOTE — Progress Notes (Signed)
Anesthesia Chart Review   Case: [id number] Date/Time: 06/24/21 0900   Procedures:      TRANSURETHRAL RESECTION OF THE PROSTATE (TURP)     CYSTOSCOPY   Anesthesia type: General   Pre-op diagnosis: BPH   Location: [facility] PROCEDURE [facility] / [facility]   Surgeons: [name], MD       DISCUSSION:55 y.o. never smoker with h/o HTN, stroke, gastric bypass, sleep apnea, BPH scheduled for above procedure 06/24/2021 with Dr. Karoline Caldwell.   Low risk stress test 03/05/2020.   Echo 03/05/2020 with EF 55-60%.   Anticipate pt can proceed with planned procedure barring acute status change.   VS: BP 126/80   Pulse 64   Temp 36.8 C (Oral)   Resp 16   Ht 5\' 9"  (1.753 m)   Wt 92.1 kg   SpO2 98%   BMI 29.98 kg/m   PROVIDERS: , DO is PCP    LABS: Labs reviewed: Acceptable for surgery. (all labs ordered are listed, but only abnormal results are displayed)  Labs Reviewed  BASIC METABOLIC PANEL - Abnormal; Notable for the following components:      Result Value   Calcium 8.7 (*)    Anion gap 4 (*)    All other components within normal limits  CBC     IMAGES:   EKG: 11/19/2020 Rate 75 bpm  NSR RBBB  CV: Stress Test 03/05/2020 The left ventricular ejection fraction is normal (55-65%). Nuclear stress EF: 58%. There was no ST segment deviation noted during stress. The study is normal. This is a low risk study.  Echo 03/05/2020  1. Left ventricular ejection fraction, by estimation, is 55 to 60%. The  left ventricle has normal function. The left ventricle has no regional  wall motion abnormalities. There is moderate concentric left ventricular  hypertrophy. Left ventricular  diastolic parameters are consistent with Grade II diastolic dysfunction  (pseudonormalization). Elevated left ventricular end-diastolic pressure.   2. Right ventricular systolic function is normal. The right ventricular  size is normal. There is normal pulmonary artery systolic pressure.    3. Left atrial size was mildly dilated.   4. The mitral valve is normal in structure. Trivial mitral valve  regurgitation. No evidence of mitral stenosis.   5. The aortic valve is tricuspid. Aortic valve regurgitation is not  visualized. No aortic stenosis is present.   6. The inferior vena cava is normal in size with greater than 50%  respiratory variability, suggesting right atrial pressure of 3 mmHg. Past Medical History:  Diagnosis Date   History of gout    Hypertension    MI (myocardial infarction) (HCC) 2004   Sleep apnea    "don't wear mask anymore"   Stroke (HCC) 05/27/2014   "right side still weaker" (05/31/2014)    Past Surgical History:  Procedure Laterality Date   ANKLE FRACTURE SURGERY Left 2000's   CARDIAC CATHETERIZATION  2004; 2014   HAND SURGERY     left palm and fingertip injury   ROUX-EN-Y GASTRIC BYPASS  2013   SKIN BIOPSY      MEDICATIONS:  amitriptyline (ELAVIL) 50 MG tablet   amLODipine (NORVASC) 5 MG tablet   aspirin EC 81 MG tablet   atorvastatin (LIPITOR) 40 MG tablet   carvedilol (COREG) 12.5 MG tablet   finasteride (PROSCAR) 5 MG tablet   gabapentin (NEURONTIN) 300 MG capsule   isosorbide mononitrate (IMDUR) 30 MG 24 hr tablet   methocarbamol (ROBAXIN) 500 MG tablet   oxyCODONE-acetaminophen (PERCOCET/ROXICET)  5-325 MG tablet   tamsulosin (FLOMAX) 0.4 MG CAPS capsule   traZODone (DESYREL) 100 MG tablet   No current facility-administered medications for this encounter.     Konrad Felix Ward, PA-C [facility]-Surgical Testing [phone]

## 2021-06-19 NOTE — Progress Notes (Addendum)
Anesthesia Review:  PCP: DR Carmelia Roller- LOV 12/25/20 Cardiologist : Dr Carmelia Roller LOV 12/25/20 per pt  In epic found DR Mauri [place]- LOV- 02/13/2020 Chest x-ray : EKG : 11/19/20  Echo :03/05/20  Stress test: 03/05/20  Cardiac Cath :  Activity level: can do a flight of stairs wtihout difficulty  Sleep Study/ CPAP : none  Fasting Blood Sugar :      / Checks Blood Sugar -- times a day:   Blood Thinner/ Instructions /Last Dose: ASA / Instructions/ Last Dose :   Retired Engineer, site.   Covid test on 06/20/21

## 2021-06-20 ENCOUNTER — Other Ambulatory Visit: Payer: Self-pay | Admitting: Urology

## 2021-06-20 LAB — SARS CORONAVIRUS 2 (TAT 6-24 HRS): SARS Coronavirus 2: NEGATIVE

## 2021-06-23 NOTE — H&P (Addendum)
Patient is a 55 year old African American male seen today for evaluation of bladder outlet symptoms. There is a known family history of prostate cancer with his father and 2 uncles dying of prostate cancer in their late 16s by his report. He has had some progressive worsening urinary symptomatology mainly consisting of hesitancy 3-4 times nocturia and straining to void over the last 2-3 months. Denies any dysuria or hematuria. His PSAs have been normal last 1 being 2.21 on 01/10/2020. Review of primary care physician showed that he may have start him on tamsulosin but the patient states he has not taken any medicines to help with the urination. I showed him a picture of the tamsulosin on the [employer] website and he states he is not on any medicines like this.  Postvoid residual today is 7 cc. Micro urinalysis is clear on urine spun sediment   Second issue is that of some blood noted in the stool over the last week or so was fairly consistent for about a week according to the patient. Denies any pain with bowel movement. He states he has had normal colonoscopy back in 2019. There is family history of colon cancer by his report.  -02/03/21-patient with history of BPH as above. Was initiated on tamsulosin 0.4 mg daily. He has noted minimal change in the urinary symptomatology. Tolerated medications satisfactory. States he continues to have slow urinary stream and straining to void is the main issue.  Micro urinalysis clear  Postvoid residual equals:22cc  -03/17/21-patient with history of BPH mainly with symptoms of hesitancy and nocturia as above. Recently switched to silodosin 8 mg daily. In the interim has noted no significant change in his urinary symptoms. Main issue is hesitancy and urgency and occasional urge incontinence.  Micro urinalysis is clear on urine spun   -04/10/21-patient with presumed BPH which has been unresponsive to alpha blockers. Has undergone recent urodynamic testing which showed freely  normal capacity up to 540 cc no bladder instability. There was maximum voiding pressure of 97 cm of water and maximum flow rate was 7 mL/second. Postvoid residual was 197. Here for cysto and to discuss next steps of management. See urodynamic report below.  Cysto is performed today and shows: Trilobar prostatic hypertrophy with prostatic urethral length of about 3 cm. There is intravesical median lobe. Bladder. Grossly normal without mucosal lesions.   PROCEDURES:   Urodynamics - [id number], [id number], [id number], [id number]  URODYNAMICS STUDY   Test Indication: Urge Incontinence and Frequency  The procedure's risks, benefits and infection risk were discussed with the patient.   PRE UROFLOW & CATHETERIZATION  Procedure: Pre Uroflow Study  The patient did not void. No urge to void.  A 14 FR straight catheter was inserted and 50 mls was obtained.  A Urodynamic catheter was inserted. Urine dipstick was negative for WBCs and bacteria.   CYSTOMETRY/CYSTOMETROGRAM  The bladder was filled with room temperature water at a rate of less than 50 cc per minute.  Injection of contrast was performed for the cystometrogram.  Max capacity was approx. 521mls. He went from no sensation to a very strong desire to urinate. He expressed that this is very normal for him.   The bladder was stable.   LEAK POINT PRESSURE  LPPs were assessed with pt in a seated position.  No leakage was noted with abdominal pressures of 57 cmH20.   PRESSURE FLOW STUDY  He was able to generate a voluntary contraction and void. He had to push, strain, and stand in order  to empty his bladder.   Volume voided approx. 349 mls. Max flow was 7 ml/s.  Detrusor pressure at peak flow was 97 cmH20.  Max detrusor pressure was 97 cmH20.  PVR was approx. 191 mls  He did not feel empty.   ELECTROMYOGRAM  Activity was measured by surface electrodes  EMG leads were basically quiet during voiding.   FLUOROSCOPY and VCUG  X-ray not done due to iodine  shortage.   POST PROCEDURE ANTIBIOTICS:  One Bactrim DS was given.   UDS SUMMARY  Brian Velez held a max capacity of approx. 540 mls. He went from no sensation to a strong sensation and expressed that this was very normal for him. No instability was noted. He was able to generate a voluntary contraction and void 349 mls. He had to push, strain and stand in order to empty his bladder. He did not feel empty after the voiding phase was completed. EMG leads were basically quiet during voiding. PVR was approx. 191 mls. X-ray not done due to iodine shortage.  Discussed urodynamic and cystoscopic findings suggesting obstructive prostate as cause for his symptoms. We discussed treatment options. I do not think he is a candidate for Uro lift due to median lobe hypertrophy. Recommended cysto and TURP. Patient wants to proceed with procedure. I recommended placing him on finasteride for 6-8 weeks prior to procedure to minimize chance of bleeding. Will see him back here in 6-8 weeks after finasteride to reassess his urinary symptomatology and perhaps schedule for TURP.  -05/21/21-patient with history of BPH and elevated residual. Found to have a trilobar prostatic hypertrophy. Cystoscopy last office visit. Also had urodynamics showing high-pressure low-flow obstructive pattern. He was initiated on finasteride and is now here for follow-up. He has noted no significant improvement from a urinary standpoint. Tolerating the finasteride satisfactorily  Post void residual is 473 cc patient unable to get [place] a urine specimen today     ALLERGIES: None   MEDICATIONS: Finasteride 5 mg tablet 1 tablet PO Daily  Flomax 0.4 mg capsule 1 capsule PO Daily  Silodosin 8 mg capsule 1 capsule PO Daily     GU PSH: Complex cystometrogram, w/ void pressure and urethral pressure profile studies, any technique - 03/28/2021 Complex Uroflow - 03/28/2021 Cystoscopy - 04/10/2021 Emg surf Electrd - 03/28/2021 Intrabd voidng Press -  03/28/2021     NON-GU PSH: Ankle Arthroscopy/surgery Back surgery Foot surgery (unspecified)     GU PMH: BPH w/LUTS - 04/10/2021, - 03/17/2021, - 02/03/2021, - 12/09/2020 Urge incontinence - 04/10/2021, - 03/28/2021, - 03/17/2021 Weak Urinary Stream - 04/10/2021, - 03/17/2021, - 02/03/2021, - 12/09/2020 Family Hx of Prostate Cancer - 12/09/2020 Nocturia - 12/09/2020    NON-GU PMH: Gout Hypertension Sleep Apnea    FAMILY HISTORY: Alzheimer's Disease - Mother Cancer - Father Myocardial Infarction - Father    Notes: parents deceased   SOCIAL HISTORY: Marital Status: Married Preferred Language: English; Ethnicity: Not Hispanic Or Latino; Race: Black or African American Current Smoking Status: Patient has never smoked.   Tobacco Use Assessment Completed: Used Tobacco in last 30 days? Does not use smokeless tobacco. Does not use drugs. Drinks 2 caffeinated drinks per day. Has not had a blood transfusion.    REVIEW OF SYSTEMS:    GU Review Male:   Patient reports frequent urination, hard to postpone urination, burning/ pain with urination, leakage of urine, stream starts and stops, trouble starting your stream, and have to strain to urinate . Patient denies get up at  night to urinate, erection problems, and penile pain.  Gastrointestinal (Upper):   Patient denies nausea, vomiting, and indigestion/ heartburn.  Gastrointestinal (Lower):   Patient denies diarrhea and constipation.  Constitutional:   Patient denies fever, night sweats, weight loss, and fatigue.  Skin:   Patient denies skin rash/ lesion and itching.  Eyes:   Patient denies blurred vision and double vision.  Ears/ Nose/ Throat:   Patient denies sore throat and sinus problems.  Hematologic/Lymphatic:   Patient denies swollen glands and easy bruising.  Cardiovascular:   Patient denies leg swelling and chest pains.  Respiratory:   Patient denies cough and shortness of breath.  Endocrine:   Patient reports excessive thirst.    Musculoskeletal:   Patient reports back pain and joint pain.   Neurological:   Patient denies headaches and dizziness.  Psychologic:   Patient denies depression and anxiety.   VITAL SIGNS: None   Complexity of Data:  Source Of History:  Patient  Records Review:   Previous Doctor Records, Previous Patient Records  Urodynamics Review:   Review Bladder Scan   PROCEDURES:         PVR Ultrasound - [id number]  Scanned Volume: 473 cc   ASSESSMENT:      ICD-10 Details  1 GU:   BPH w/LUTS - N40.1 Acute, Complicated Injury  2   Weak Urinary Stream - R39.12 Acute, Complicated Injury  3   Incomplete bladder emptying - R39.14 Acute, Complicated Injury     PLAN:           Document Letter(s):  Created for Patient: Clinical Summary         Notes:   We discussed proceeding with cystoscopy and TURP due to his trilobar prostatic hypertrophy. Risk and benefits of procedure were discussed as outlined below. We will schedule accordingly in the near future. Patient knows to be off over-the-counter blood thinning medications such as Advil Motrin etc.  I have discussed with the patient the risks, benefits and alternatives of trans urethral resection of prostate which includes but is not limited to: Bleeding, sometimes requiring transfusion of blood, TUR syndrome, prolonged Foley catheter drainage, infection, urinary incontinence, urinary retention, damage to surrounding organs, need for possible additional procedures, possibility of nonhealing area leading to recurrent hematuria, retrograde ejaculation, erectile dysfunction, and urgency and frequency which can be refractory to medication,. Typically the hospital stay will be overnight with a catheter in place up to a week. The patient's expected recovery. With irritative voiding symptoms and intermittent hematuria for up to a month. The patient voices understanding of the risks and benefits of the TURP procedure and consents to proceed.

## 2021-06-23 NOTE — Anesthesia Preprocedure Evaluation (Addendum)
Anesthesia Evaluation  Patient identified by MRN, date of birth, ID band Patient awake    Reviewed: Allergy & Precautions, NPO status , Patient's Chart, lab work & pertinent test results, reviewed documented beta blocker date and time   Airway Mallampati: I  TM Distance: >3 FB Neck ROM: Full    Dental no notable dental hx. (+) Teeth Intact, Dental Advisory Given   Pulmonary sleep apnea (does not wear CPAP) ,    Pulmonary exam normal breath sounds clear to auscultation       Cardiovascular hypertension, Pt. on home beta blockers and Pt. on medications + angina + CAD and + Past MI  Normal cardiovascular exam Rhythm:Regular Rate:Normal  TTE 2021 1. Left ventricular ejection fraction, by estimation, is 55 to 60%. The  left ventricle has normal function. The left ventricle has no regional  wall motion abnormalities. There is moderate concentric left ventricular  hypertrophy. Left ventricular  diastolic parameters are consistent with Grade II diastolic dysfunction  (pseudonormalization). Elevated left ventricular end-diastolic pressure.  2. Right ventricular systolic function is normal. The right ventricular  size is normal. There is normal pulmonary artery systolic pressure.  3. Left atrial size was mildly dilated.  4. The mitral valve is normal in structure. Trivial mitral valve  regurgitation. No evidence of mitral stenosis.  5. The aortic valve is tricuspid. Aortic valve regurgitation is not  visualized. No aortic stenosis is present.  6. The inferior vena cava is normal in size with greater than 50%  respiratory variability, suggesting right atrial pressure of 3 mmHg.  Stress Test 2021 The left ventricular ejection fraction is normal (55-65%). Nuclear stress EF: 58%. There was no ST segment deviation noted during stress. The study is normal. This is a low risk study.    Neuro/Psych CVA, No Residual Symptoms negative  psych ROS   GI/Hepatic negative GI ROS, Neg liver ROS,   Endo/Other  negative endocrine ROS  Renal/GU negative Renal ROS  negative genitourinary   Musculoskeletal  (+) Arthritis ,   Abdominal   Peds  Hematology negative hematology ROS (+)   Anesthesia Other Findings   Reproductive/Obstetrics                           Anesthesia Physical Anesthesia Plan  ASA: 3  Anesthesia Plan: General   Post-op Pain Management:    Induction: Intravenous  PONV Risk Score and Plan: 2 and Ondansetron, Dexamethasone and Midazolam  Airway Management Planned: Oral ETT  Additional Equipment:   Intra-op Plan:   Post-operative Plan: Extubation in OR  Informed Consent: I have reviewed the patients History and Physical, chart, labs and discussed the procedure including the risks, benefits and alternatives for the proposed anesthesia with the patient or authorized representative who has indicated his/her understanding and acceptance.     Dental advisory given  Plan Discussed with: CRNA  Anesthesia Plan Comments:        Anesthesia Quick Evaluation

## 2021-06-24 ENCOUNTER — Encounter (HOSPITAL_COMMUNITY)
Admission: RE | Disposition: A | Discharge status: Home / Self Care | Payer: Self-pay | Source: Ambulatory Visit | Attending: Urology

## 2021-06-24 ENCOUNTER — Ambulatory Visit (HOSPITAL_COMMUNITY)
Admission: RE | Admit: 2021-06-24 | Discharge: 2021-06-25 | Disposition: A | Discharge status: Home / Self Care | Payer: Self-pay | Source: Ambulatory Visit | Attending: Urology | Admitting: Urology

## 2021-06-24 ENCOUNTER — Other Ambulatory Visit: Payer: Self-pay

## 2021-06-24 ENCOUNTER — Ambulatory Visit (HOSPITAL_COMMUNITY): Payer: Self-pay | Admitting: Anesthesiology

## 2021-06-24 ENCOUNTER — Encounter (HOSPITAL_COMMUNITY): Payer: Self-pay | Admitting: Urology

## 2021-06-24 ENCOUNTER — Ambulatory Visit (HOSPITAL_COMMUNITY): Payer: Self-pay | Admitting: Physician Assistant

## 2021-06-24 DIAGNOSIS — N400 Enlarged prostate without lower urinary tract symptoms: Secondary | ICD-10-CM | POA: Diagnosis present

## 2021-06-24 DIAGNOSIS — R3914 Feeling of incomplete bladder emptying: Secondary | ICD-10-CM | POA: Insufficient documentation

## 2021-06-24 DIAGNOSIS — Z8042 Family history of malignant neoplasm of prostate: Secondary | ICD-10-CM | POA: Insufficient documentation

## 2021-06-24 DIAGNOSIS — N138 Other obstructive and reflux uropathy: Secondary | ICD-10-CM | POA: Insufficient documentation

## 2021-06-24 DIAGNOSIS — I10 Essential (primary) hypertension: Secondary | ICD-10-CM | POA: Insufficient documentation

## 2021-06-24 DIAGNOSIS — N401 Enlarged prostate with lower urinary tract symptoms: Secondary | ICD-10-CM | POA: Insufficient documentation

## 2021-06-24 DIAGNOSIS — Z79899 Other long term (current) drug therapy: Secondary | ICD-10-CM | POA: Insufficient documentation

## 2021-06-24 DIAGNOSIS — Z8673 Personal history of transient ischemic attack (TIA), and cerebral infarction without residual deficits: Secondary | ICD-10-CM | POA: Insufficient documentation

## 2021-06-24 DIAGNOSIS — G4730 Sleep apnea, unspecified: Secondary | ICD-10-CM | POA: Insufficient documentation

## 2021-06-24 DIAGNOSIS — I25119 Atherosclerotic heart disease of native coronary artery with unspecified angina pectoris: Secondary | ICD-10-CM

## 2021-06-24 DIAGNOSIS — R3912 Poor urinary stream: Secondary | ICD-10-CM | POA: Insufficient documentation

## 2021-06-24 DIAGNOSIS — K921 Melena: Secondary | ICD-10-CM | POA: Insufficient documentation

## 2021-06-24 DIAGNOSIS — I252 Old myocardial infarction: Secondary | ICD-10-CM | POA: Insufficient documentation

## 2021-06-24 HISTORY — PX: CYSTOSCOPY: SHX5120

## 2021-06-24 HISTORY — PX: TRANSURETHRAL RESECTION OF PROSTATE: SHX73

## 2021-06-24 SURGERY — TURP (TRANSURETHRAL RESECTION OF PROSTATE)
Anesthesia: General | Site: Prostate

## 2021-06-24 MED ORDER — SODIUM CHLORIDE 0.9 % IR SOLN
Status: DC | PRN
  Administered 2021-06-24 (×5): 6000 mL

## 2021-06-24 MED ORDER — DEXAMETHASONE SODIUM PHOSPHATE 10 MG/ML IJ SOLN
INTRAMUSCULAR | Status: AC
  Filled 2021-06-24: qty 1

## 2021-06-24 MED ORDER — BELLADONNA ALKALOIDS-OPIUM 16.2-30 MG RE SUPP
RECTAL | Status: AC
  Filled 2021-06-24: qty 1

## 2021-06-24 MED ORDER — SODIUM CHLORIDE 0.9 % IV SOLN: INTRAVENOUS | Status: DC

## 2021-06-24 MED ORDER — LIDOCAINE HCL (PF) 2 % IJ SOLN
INTRAMUSCULAR | Status: AC
  Filled 2021-06-24: qty 5

## 2021-06-24 MED ORDER — OXYBUTYNIN CHLORIDE 5 MG PO TABS
ORAL_TABLET | ORAL | Status: AC
  Filled 2021-06-24: qty 1

## 2021-06-24 MED ORDER — SUGAMMADEX SODIUM 200 MG/2ML IV SOLN
INTRAVENOUS | Status: DC | PRN
  Administered 2021-06-24: 200 mg via INTRAVENOUS

## 2021-06-24 MED ORDER — DIPHENHYDRAMINE HCL 50 MG/ML IJ SOLN: 12.5000 mg | Freq: Four times a day (QID) | INTRAMUSCULAR | Status: DC | PRN

## 2021-06-24 MED ORDER — MIDAZOLAM HCL 2 MG/2ML IJ SOLN
INTRAMUSCULAR | Status: AC
  Filled 2021-06-24: qty 2

## 2021-06-24 MED ORDER — SODIUM CHLORIDE 0.9 % IR SOLN
3000.0000 mL | Status: DC
  Administered 2021-06-24: 3000 mL

## 2021-06-24 MED ORDER — OXYBUTYNIN CHLORIDE 5 MG PO TABS
5.0000 mg | ORAL_TABLET | Freq: Three times a day (TID) | ORAL | Status: DC | PRN
  Administered 2021-06-24 (×2): 5 mg via ORAL
  Filled 2021-06-24: qty 1

## 2021-06-24 MED ORDER — FINASTERIDE 5 MG PO TABS
5.0000 mg | ORAL_TABLET | Freq: Every day | ORAL | Status: DC
  Administered 2021-06-25: 5 mg via ORAL
  Filled 2021-06-24: qty 1

## 2021-06-24 MED ORDER — CARVEDILOL 12.5 MG PO TABS
12.5000 mg | ORAL_TABLET | Freq: Two times a day (BID) | ORAL | Status: DC
  Administered 2021-06-24 – 2021-06-25 (×2): 12.5 mg via ORAL
  Filled 2021-06-24 (×2): qty 1

## 2021-06-24 MED ORDER — METHOCARBAMOL 500 MG PO TABS
500.0000 mg | ORAL_TABLET | Freq: Four times a day (QID) | ORAL | Status: DC | PRN
  Administered 2021-06-24 (×2): 500 mg via ORAL
  Filled 2021-06-24: qty 1

## 2021-06-24 MED ORDER — FENTANYL CITRATE PF 50 MCG/ML IJ SOSY
25.0000 ug | PREFILLED_SYRINGE | INTRAMUSCULAR | Status: DC | PRN
  Administered 2021-06-24 (×2): 50 ug via INTRAVENOUS

## 2021-06-24 MED ORDER — TRAZODONE HCL 50 MG PO TABS
50.0000 mg | ORAL_TABLET | Freq: Every day | ORAL | Status: DC
  Administered 2021-06-24: 50 mg via ORAL
  Filled 2021-06-24: qty 1

## 2021-06-24 MED ORDER — BELLADONNA ALKALOIDS-OPIUM 16.2-60 MG RE SUPP
RECTAL | Status: DC | PRN
  Administered 2021-06-24: 1 via RECTAL

## 2021-06-24 MED ORDER — CEFAZOLIN SODIUM-DEXTROSE 2-4 GM/100ML-% IV SOLN
2.0000 g | INTRAVENOUS | Status: AC
  Administered 2021-06-24: 2 g via INTRAVENOUS
  Filled 2021-06-24: qty 100

## 2021-06-24 MED ORDER — LACTATED RINGERS IV SOLN: INTRAVENOUS | Status: DC

## 2021-06-24 MED ORDER — FENTANYL CITRATE PF 50 MCG/ML IJ SOSY
PREFILLED_SYRINGE | INTRAMUSCULAR | Status: AC
  Administered 2021-06-24: 50 ug via INTRAVENOUS
  Filled 2021-06-24: qty 3

## 2021-06-24 MED ORDER — GABAPENTIN 100 MG PO CAPS
100.0000 mg | ORAL_CAPSULE | Freq: Two times a day (BID) | ORAL | Status: DC
  Administered 2021-06-24 – 2021-06-25 (×2): 100 mg via ORAL
  Filled 2021-06-24 (×2): qty 1

## 2021-06-24 MED ORDER — PROPOFOL 10 MG/ML IV BOLUS
INTRAVENOUS | Status: AC
  Filled 2021-06-24: qty 20

## 2021-06-24 MED ORDER — OXYCODONE-ACETAMINOPHEN 5-325 MG PO TABS
1.0000 | ORAL_TABLET | Freq: Four times a day (QID) | ORAL | Status: DC | PRN
  Administered 2021-06-24 (×2): 1 via ORAL
  Filled 2021-06-24: qty 1

## 2021-06-24 MED ORDER — ONDANSETRON HCL 4 MG/2ML IJ SOLN: 4.0000 mg | INTRAMUSCULAR | Status: DC | PRN

## 2021-06-24 MED ORDER — DEXAMETHASONE SODIUM PHOSPHATE 10 MG/ML IJ SOLN
INTRAMUSCULAR | Status: DC | PRN
  Administered 2021-06-24: 10 mg via INTRAVENOUS

## 2021-06-24 MED ORDER — CHLORHEXIDINE GLUCONATE 0.12 % MT SOLN
15.0000 mL | Freq: Once | OROMUCOSAL | Status: AC
  Administered 2021-06-24: 15 mL via OROMUCOSAL

## 2021-06-24 MED ORDER — FENTANYL CITRATE (PF) 100 MCG/2ML IJ SOLN
INTRAMUSCULAR | Status: DC | PRN
  Administered 2021-06-24 (×2): 50 ug via INTRAVENOUS

## 2021-06-24 MED ORDER — OXYCODONE HCL 5 MG PO TABS
ORAL_TABLET | ORAL | Status: AC
  Filled 2021-06-24: qty 1

## 2021-06-24 MED ORDER — METHOCARBAMOL 500 MG PO TABS
ORAL_TABLET | ORAL | Status: AC
  Filled 2021-06-24: qty 1

## 2021-06-24 MED ORDER — LIDOCAINE 2% (20 MG/ML) 5 ML SYRINGE
INTRAMUSCULAR | Status: DC | PRN
  Administered 2021-06-24: 60 mg via INTRAVENOUS

## 2021-06-24 MED ORDER — PROPOFOL 10 MG/ML IV BOLUS
INTRAVENOUS | Status: DC | PRN
  Administered 2021-06-24: 150 mg via INTRAVENOUS

## 2021-06-24 MED ORDER — OXYCODONE HCL 5 MG PO TABS
5.0000 mg | ORAL_TABLET | ORAL | Status: DC | PRN
  Administered 2021-06-24 – 2021-06-25 (×4): 5 mg via ORAL
  Filled 2021-06-24 (×3): qty 1

## 2021-06-24 MED ORDER — DIPHENHYDRAMINE HCL 12.5 MG/5ML PO ELIX
12.5000 mg | ORAL_SOLUTION | Freq: Four times a day (QID) | ORAL | Status: DC | PRN
  Administered 2021-06-24: 12.5 mg via ORAL
  Filled 2021-06-24: qty 5

## 2021-06-24 MED ORDER — PHENYLEPHRINE 40 MCG/ML (10ML) SYRINGE FOR IV PUSH (FOR BLOOD PRESSURE SUPPORT)
PREFILLED_SYRINGE | INTRAVENOUS | Status: DC | PRN
  Administered 2021-06-24: 80 ug via INTRAVENOUS
  Administered 2021-06-24: 120 ug via INTRAVENOUS
  Administered 2021-06-24: 80 ug via INTRAVENOUS
  Administered 2021-06-24: 120 ug via INTRAVENOUS

## 2021-06-24 MED ORDER — FENTANYL CITRATE (PF) 100 MCG/2ML IJ SOLN
INTRAMUSCULAR | Status: AC
  Filled 2021-06-24: qty 2

## 2021-06-24 MED ORDER — ROCURONIUM BROMIDE 10 MG/ML (PF) SYRINGE
PREFILLED_SYRINGE | INTRAVENOUS | Status: DC | PRN
  Administered 2021-06-24: 80 mg via INTRAVENOUS

## 2021-06-24 MED ORDER — ATORVASTATIN CALCIUM 40 MG PO TABS
40.0000 mg | ORAL_TABLET | Freq: Every day | ORAL | Status: DC
  Administered 2021-06-24: 40 mg via ORAL
  Filled 2021-06-24: qty 1

## 2021-06-24 MED ORDER — ACETAMINOPHEN 325 MG PO TABS
650.0000 mg | ORAL_TABLET | ORAL | Status: DC | PRN
  Administered 2021-06-24: 650 mg via ORAL
  Filled 2021-06-24: qty 2

## 2021-06-24 MED ORDER — OXYCODONE-ACETAMINOPHEN 5-325 MG PO TABS
ORAL_TABLET | ORAL | Status: AC
  Filled 2021-06-24: qty 1

## 2021-06-24 MED ORDER — ORAL CARE MOUTH RINSE: 15.0000 mL | Freq: Once | OROMUCOSAL | Status: AC

## 2021-06-24 MED ORDER — ONDANSETRON HCL 4 MG/2ML IJ SOLN
INTRAMUSCULAR | Status: AC
  Filled 2021-06-24: qty 2

## 2021-06-24 MED ORDER — ONDANSETRON HCL 4 MG/2ML IJ SOLN
INTRAMUSCULAR | Status: DC | PRN
  Administered 2021-06-24: 4 mg via INTRAVENOUS

## 2021-06-24 MED ORDER — MIDAZOLAM HCL 2 MG/2ML IJ SOLN
INTRAMUSCULAR | Status: DC | PRN
  Administered 2021-06-24: 2 mg via INTRAVENOUS

## 2021-06-24 MED ORDER — AMLODIPINE BESYLATE 5 MG PO TABS
5.0000 mg | ORAL_TABLET | Freq: Every day | ORAL | Status: DC
  Administered 2021-06-25: 5 mg via ORAL
  Filled 2021-06-24: qty 1

## 2021-06-24 MED ORDER — ACETAMINOPHEN 500 MG PO TABS
1000.0000 mg | ORAL_TABLET | Freq: Once | ORAL | Status: AC
  Administered 2021-06-24: 1000 mg via ORAL
  Filled 2021-06-24: qty 2

## 2021-06-24 MED ORDER — AMITRIPTYLINE HCL 10 MG PO TABS
10.0000 mg | ORAL_TABLET | Freq: Every day | ORAL | Status: DC
  Administered 2021-06-24: 10 mg via ORAL
  Filled 2021-06-24: qty 1

## 2021-06-24 SURGICAL SUPPLY — 23 items
BAG DRN RND TRDRP ANRFLXCHMBR (UROLOGICAL SUPPLIES) ×1
BAG URINE DRAIN 2000ML AR STRL (UROLOGICAL SUPPLIES) ×2 IMPLANT
BAG URO CATCHER STRL LF (MISCELLANEOUS) ×2 IMPLANT
BULB IRRIG PATHFIND (MISCELLANEOUS) IMPLANT
CATH HEMA 3WAY 30CC 22FR COUDE (CATHETERS) IMPLANT
CATH URETL OPEN END 6FR 70 (CATHETERS) ×2 IMPLANT
CLOTH BEACON ORANGE TIMEOUT ST (SAFETY) ×2 IMPLANT
DRAPE FOOT SWITCH (DRAPES) ×2 IMPLANT
EVACUATOR MICROVAS BLADDER (UROLOGICAL SUPPLIES) ×2 IMPLANT
GLOVE SURG ENC TEXT LTX SZ7.5 (GLOVE) ×2 IMPLANT
GOWN STRL REUS W/TWL LRG LVL3 (GOWN DISPOSABLE) ×4 IMPLANT
GUIDEWIRE STR DUAL SENSOR (WIRE) ×2 IMPLANT
HOLDER FOLEY CATH W/STRAP (MISCELLANEOUS) IMPLANT
LOOP CUT BIPOLAR 24F LRG (ELECTROSURGICAL) IMPLANT
MANIFOLD NEPTUNE II (INSTRUMENTS) ×2 IMPLANT
PACK CYSTO (CUSTOM PROCEDURE TRAY) ×2 IMPLANT
PENCIL SMOKE EVACUATOR (MISCELLANEOUS) IMPLANT
SYR 20ML LL LF (SYRINGE) IMPLANT
SYR 30ML LL (SYRINGE) ×2 IMPLANT
SYR TOOMEY IRRIG 70ML (MISCELLANEOUS) ×2
SYRINGE TOOMEY IRRIG 70ML (MISCELLANEOUS) ×1 IMPLANT
TUBING CONNECTING 10 (TUBING) ×2 IMPLANT
TUBING UROLOGY SET (TUBING) ×2 IMPLANT

## 2021-06-24 NOTE — Interval H&P Note (Signed)
History and Physical Interval Note:  06/24/2021 8:51 AM  Brian Velez  has presented today for surgery, with the diagnosis of BPH.  The various methods of treatment have been discussed with the patient and family. After consideration of risks, benefits and other options for treatment, the patient has consented to  Procedure(s): TRANSURETHRAL RESECTION OF THE PROSTATE (TURP) (N/A) CYSTOSCOPY (N/A) as a surgical intervention.  The patient's history has been reviewed, patient examined, no change in status, stable for surgery.  I have reviewed the patient's chart and labs.  Questions were answered to the patient's satisfaction.     [name]

## 2021-06-24 NOTE — Anesthesia Procedure Notes (Addendum)
Procedure Name: Intubation Date/Time: 06/24/2021 9:23 AM Performed by: Florene Route, CRNA Pre-anesthesia Checklist: Patient identified, Emergency Drugs available, Suction available and Patient being monitored Patient Re-evaluated:Patient Re-evaluated prior to induction Oxygen Delivery Method: Circle system utilized Preoxygenation: Pre-oxygenation with 100% oxygen Induction Type: IV induction Ventilation: Mask ventilation without difficulty Laryngoscope Size: Miller and 3 Grade View: Grade I Tube type: Oral Tube size: 8.0 mm Number of attempts: 1 Airway Equipment and Method: Stylet Placement Confirmation: ETT inserted through vocal cords under direct vision, positive ETCO2 and breath sounds checked- equal and bilateral Secured at: 22 cm Tube secured with: Tape Dental Injury: Teeth and Oropharynx as per pre-operative assessment

## 2021-06-24 NOTE — Op Note (Signed)
Preoperative diagnosis:  1.  Benign prostatic hypertrophy with bladder outlet obstruction  Postoperative diagnosis: 1.  Same  Procedure(s): 1.  Cystoscopy, transurethral section of the prostate  Surgeon: Dr. Karoline Caldwell  Anesthesia: General  Complications: None  EBL: Less than 50 cc  Specimens: Prostate chips  Disposition of specimens: To pathology  Intraoperative findings: Trilobar prostatic hypertrophy with approximate 3 to 3-1/2 cm prostatic urethral length.  TURP performed without difficulty.  Ureteral orifice ease well away from bladder neck  Indication: Patient is a 55 year old African-American male has had progressive worsening bladder outlet obstructive symptoms which has been minimally responsive to medical therapy.  Presents at this time to get a cystoscopy and TURP.  Description of procedure:  After obtaining informed consent for the patient is taken the major OR suite placed under general anesthesia.  Placed in the dorsolithotomy position genitalia prepped and draped in usual sterile fashion.  Proper pause and timeout was performed.  21 Fransico was advanced in the bladder.  Patient noted to have trilobar prostatic hypertrophy bladder appeared grossly normal ureteral orifice ease were well away from the bladder neck.  Patient did have moderate sized median lobe hypertrophy.  Cystoscope was removed and the direct visualized obturator was placed for the resectoscope without difficulty.  The saline bipolar resectoscope was then utilized first to resect the anterior commissure.  Resection of the lateral lobes was then commenced from the 11:00 to 5:00 positions in the 1:00 to 7:00 positions resecting from the bladder neck to the level just proximal to the verumontanum to preserve the external striated sphincter.  Median lobe was subsequently resected down to the level of the bladder neck proper taking care not to undermine the bladder neck.  The prostatic chips were subsequently  irrigated from the bladder.  Second look of bladder revealed no remaining chips.  Cautery loop was utilized to obtain good hemostasis and resectoscope was removed 22 [place] three-way Foley was placed to CBI irrigation effluent was clear at termination of the case.  BNO suppository was placed at termination the case for postoperative bladder spasm.  Patient tolerated procedure well was returned to the recovery room in stable condition.  No immediate complication from the procedure.

## 2021-06-24 NOTE — Anesthesia Postprocedure Evaluation (Signed)
Anesthesia Post Note  Patient: Brian Velez  Procedure(s) Performed: TRANSURETHRAL RESECTION OF THE PROSTATE (TURP) (Prostate) CYSTOSCOPY (Prostate)     Patient location during evaluation: PACU Anesthesia Type: General Level of consciousness: awake and alert Pain management: pain level controlled Vital Signs Assessment: post-procedure vital signs reviewed and stable Respiratory status: spontaneous breathing, nonlabored ventilation, respiratory function stable and patient connected to nasal cannula oxygen Cardiovascular status: blood pressure returned to baseline and stable Postop Assessment: no apparent nausea or vomiting Anesthetic complications: no   No notable events documented.  Last Vitals:  Vitals:   06/24/21 1145 06/24/21 1200  BP: 121/84 111/78  Pulse: (!) 58 (!) 56  Resp: 13 (!) 8  Temp:  36.5 C  SpO2: 97% 97%    Last Pain:  Vitals:   06/24/21 1200  TempSrc:   PainSc: Asleep                 Emberli Ballester L Stanisha Lorenz

## 2021-06-24 NOTE — Transfer of Care (Signed)
Immediate Anesthesia Transfer of Care Note  Patient: Brian Velez  Procedure(s) Performed: TRANSURETHRAL RESECTION OF THE PROSTATE (TURP) (Prostate) CYSTOSCOPY (Prostate)  Patient Location: PACU  Anesthesia Type:General  Level of Consciousness: drowsy  Airway & Oxygen Therapy: Patient Spontanous Breathing and Patient connected to face mask oxygen  Post-op Assessment: Report given to RN and Post -op Vital signs reviewed and stable  Post vital signs: Reviewed and stable  Last Vitals:  Vitals Value Taken Time  BP 154/101 06/24/21 1041  Temp    Pulse 84 06/24/21 1043  Resp 13 06/24/21 1043  SpO2 100 % 06/24/21 1043  Vitals shown include unvalidated device data.  Last Pain:  Vitals:   06/24/21 0730  TempSrc: Oral  PainSc: 0-No pain         Complications: No notable events documented.

## 2021-06-24 NOTE — Plan of Care (Signed)
  Problem: Activity: Goal: Risk for activity intolerance will decrease Outcome: Progressing   Problem: Nutrition: Goal: Adequate nutrition will be maintained Outcome: Progressing   Problem: Coping: Goal: Level of anxiety will decrease Outcome: Progressing   Problem: Elimination: Goal: Will not experience complications related to bowel motility Outcome: Progressing   

## 2021-06-25 ENCOUNTER — Encounter (HOSPITAL_COMMUNITY): Payer: Self-pay | Admitting: Urology

## 2021-06-25 LAB — SURGICAL PATHOLOGY

## 2021-06-25 MED ORDER — ASPIRIN EC 81 MG PO TBEC: 81.0000 mg | DELAYED_RELEASE_TABLET | Freq: Every day | ORAL | 11 refills | Status: DC

## 2021-06-25 MED ORDER — OXYCODONE-ACETAMINOPHEN 5-325 MG PO TABS: 1.0000 | ORAL_TABLET | ORAL | 0 refills | Status: DC | PRN

## 2021-06-25 MED ORDER — CHLORHEXIDINE GLUCONATE CLOTH 2 % EX PADS: 6.0000 | MEDICATED_PAD | Freq: Every day | CUTANEOUS | Status: DC

## 2021-06-25 NOTE — Discharge Summary (Signed)
Date of admission: 06/24/2021  Date of discharge: 06/25/2021  Admission diagnosis: BPH with bladder outlet obstruction  Discharge diagnosis: Same  Secondary diagnoses:   History and Physical: For full details, please see admission history and physical. Briefly, Brian Velez is a 55 y.o. year old patient with BPH and bladder outlet obstruction which has been minimally responsive to medical therapy.  Presents at this time to go cystoscopy and TURP.   Hospital Course: Patient was admitted on 06/24/2021 after undergoing cystoscopy and TURP.  For details procedure please see the typed operative note.  Patient's postoperative course was unremarkable.  Urine cleared and CBI was weaned off overnight.  Patient was felt ready for discharge home.  To be discharged home with Foley catheter in place.  Plan to return on 07/01/2021 for Foley removal and voiding trial.  Patient be discharged home on routine preoperative medications plus Percocet 1 p.o. every 4 to 6 hours as needed for pain.  He knows our telephone number and will call should problems arise relative to his management in the interim.  Laboratory values: No results for input(s): HGB, HCT in the last 72 hours. No results for input(s): CREATININE in the last 72 hours.  Disposition: Home  Discharge instruction: The patient was instructed to be ambulatory but told to refrain from heavy lifting, strenuous activity, or driving.   Discharge medications:    Followup:

## 2021-06-25 NOTE — Plan of Care (Signed)
  Problem: Clinical Measurements: Goal: Respiratory complications will improve Outcome: Progressing   Problem: Activity: Goal: Risk for activity intolerance will decrease Outcome: Progressing   Problem: Nutrition: Goal: Adequate nutrition will be maintained Outcome: Progressing   Problem: Elimination: Goal: Will not experience complications related to bowel motility Outcome: Progressing   

## 2021-06-25 NOTE — Progress Notes (Signed)
1 Day Post-Op Subjective: Patient feeling well status post TURP.  Urine remains clear, CBI has been clamped.  Objective: Vital signs in last 24 hours: Temp:  [97.7 F (36.5 C)-98.4 F (36.9 C)] 98.4 F (36.9 C) (11/16 0336) Pulse Rate:  [56-89] 74 (11/16 0336) Resp:  [8-19] 18 (11/16 0336) BP: (111-154)/(74-101) 125/74 (11/16 0336) SpO2:  [94 %-100 %] 95 % (11/16 0336)  Intake/Output from previous day: 11/15 0701 - 11/16 0700 In: 7334.9 [P.O.:480; I.V.:2254.9; IV Piggyback:100] Out: 9150 [Urine:9150] Intake/Output this shift: No intake/output data recorded.  Physical Exam:  General: Alert and oriented GU: Foley in place draining clear urine  Lab Results: No results for input(s): HGB, HCT in the last 72 hours. BMET No results for input(s): NA, K, CL, CO2, GLUCOSE, BUN, CREATININE, CALCIUM in the last 72 hours.   Studies/Results: No results found.  Assessment/Plan: Status post TURP postop day #1 doing well Plan/recommendation.  Will discontinue CBI place plug and irrigation port of the Foley.  Plan for discharge home this morning if urine remains clear.  Scheduled to follow-up 06/30/2021 for Foley removal and voiding trial.    LOS: 0 days   [name] 06/25/2021, 7:54 AM

## 2021-06-25 NOTE — TOC Transition Note (Signed)
Transition of Care [facility]) - CM/SW Discharge Note   Patient Details  Name: Brian Velez MRN: [id number] Date of Birth: Jun 30, 1966  [facility] CM/SW Contact:  Golda Acre, RN Phone Number: 06/25/2021, 9:07 AM   Clinical Narrative:     Discharged to home with wife and self care.  Final next level of care: Home/Self Care Barriers to Discharge: No Barriers Identified   Patient Goals and CMS Choice Patient states their goals for this hospitalization and ongoing recovery are:: i am going home CMS Medicare.gov Compare Post Acute Care list provided to:: Patient    Discharge Placement                       Discharge Plan and Services   Discharge Planning Services: CM Consult                                 Social Determinants of Health (SDOH) Interventions     Readmission Risk Interventions No flowsheet data found.

## 2021-07-08 ENCOUNTER — Telehealth: Payer: Self-pay

## 2021-07-08 ENCOUNTER — Encounter: Payer: Self-pay | Admitting: Family Medicine

## 2021-07-08 NOTE — Telephone Encounter (Signed)
Transition Care Management Unsuccessful Follow-up Telephone Call  Date of discharge and from where:  06/25/2021  [facility]  Attempts:  1st Attempt  Reason for unsuccessful TCM follow-up call:  No answer/busy  Rowe Pavy, RN, BSN, CEN [facility] Coordinator [phone]

## 2021-07-10 ENCOUNTER — Telehealth: Payer: Self-pay | Admitting: *Deleted

## 2021-07-10 NOTE — Telephone Encounter (Signed)
Transition Care Management Unsuccessful Follow-up Telephone Call  Date of discharge and from where:  [facility]  06/25/21  Attempts:  2nd Attempt  Reason for unsuccessful TCM follow-up call:  Left voice message  Irving Shows [facility], BSN RN Case Manager [phone]

## 2021-09-03 ENCOUNTER — Other Ambulatory Visit: Payer: Self-pay | Admitting: Family Medicine

## 2021-09-03 DIAGNOSIS — M5416 Radiculopathy, lumbar region: Secondary | ICD-10-CM

## 2021-09-09 ENCOUNTER — Other Ambulatory Visit: Payer: Self-pay | Admitting: Family Medicine

## 2021-09-09 DIAGNOSIS — I25119 Atherosclerotic heart disease of native coronary artery with unspecified angina pectoris: Secondary | ICD-10-CM

## 2021-09-30 ENCOUNTER — Ambulatory Visit (INDEPENDENT_AMBULATORY_CARE_PROVIDER_SITE_OTHER): Payer: PRIVATE HEALTH INSURANCE | Admitting: Family Medicine

## 2021-09-30 ENCOUNTER — Encounter: Payer: Self-pay | Admitting: Family Medicine

## 2021-09-30 VITALS — BP 128/82 | HR 64 | Temp 97.7°F | Resp 16 | Ht 69.0 in | Wt 216.8 lb

## 2021-09-30 DIAGNOSIS — N3946 Mixed incontinence: Secondary | ICD-10-CM | POA: Diagnosis not present

## 2021-09-30 MED ORDER — OXYBUTYNIN CHLORIDE ER 5 MG PO TB24: 5.0000 mg | ORAL_TABLET | Freq: Every day | ORAL | 2 refills | Status: DC

## 2021-09-30 NOTE — Patient Instructions (Signed)
Please reach out to Dr. Irving Burton office: [phone]  There should be no cost issues with the medication.  Let us know if you need anything.

## 2021-09-30 NOTE — Progress Notes (Signed)
Chief Complaint  Patient presents with   Annual Exam    Here for Annual Exam     Subjective: Patient is a 56 y.o. male here for urinary issues.  Patient had a transurethral resection of the prostate on 06/25/2021.  Since then he has had issues with urinating himself.  If he raises his voice or his arms, sneezes, or coughs, he will leak urine.  Additionally, when he has an urge to urinate, if he does not get to the restroom frequently he will have issues with incontinence as well.  He is being forced to wear adult diapers.  It is affecting his day-to-day living.  He was told by his urologist that this would take 2 to 3 weeks to improve.  3 months later he is very frustrated.  He is not constipated.  Past Medical History:  Diagnosis Date   History of gout    Hypertension    MI (myocardial infarction) (HCC) 2004   Sleep apnea    "don't wear mask anymore"   Stroke (HCC) 05/27/2014   "right side still weaker" (05/31/2014)    Objective: BP 128/82 (BP Location: Right Arm, Patient Position: Sitting, Cuff Size: Normal)    Pulse 64    Temp 97.7 F (36.5 C) (Oral)    Resp 16    Ht 5\' 9"  (1.753 m)    Wt 216 lb 12.8 oz (98.3 kg)    SpO2 95%    BMI 32.02 kg/m  General: Awake, appears stated age Heart: RRR, no LE edema Lungs: CTAB, no rales, wheezes or rhonchi. No accessory muscle use Abdomen: Bowel sounds present, soft, nontender, nondistended Psych: Age appropriate judgment and insight, normal affect and mood  Assessment and Plan: Mixed incontinence urge and stress - Plan: oxybutynin (DITROPAN XL) 5 MG 24 hr tablet  Chronic, uncontrolled.  Kegel exercises recommended.  We will start Ditropan XL 5 mg daily.  Follow-up in 1 month. # to Dr. III provided today as he needs to f/u w him for BRBPR.  The patient voiced understanding and agreement to the plan.  Myrtie Neither [place], DO 09/30/21  12:15 PM

## 2021-11-05 ENCOUNTER — Other Ambulatory Visit: Payer: Self-pay | Admitting: Family Medicine

## 2021-11-05 DIAGNOSIS — I25119 Atherosclerotic heart disease of native coronary artery with unspecified angina pectoris: Secondary | ICD-10-CM

## 2021-11-29 ENCOUNTER — Encounter: Payer: Self-pay | Admitting: Family Medicine

## 2021-12-01 ENCOUNTER — Encounter: Payer: Self-pay | Admitting: Family Medicine

## 2021-12-01 ENCOUNTER — Telehealth (INDEPENDENT_AMBULATORY_CARE_PROVIDER_SITE_OTHER): Payer: PRIVATE HEALTH INSURANCE | Admitting: Family Medicine

## 2021-12-01 ENCOUNTER — Other Ambulatory Visit (INDEPENDENT_AMBULATORY_CARE_PROVIDER_SITE_OTHER): Payer: PRIVATE HEALTH INSURANCE

## 2021-12-01 DIAGNOSIS — K625 Hemorrhage of anus and rectum: Secondary | ICD-10-CM

## 2021-12-01 DIAGNOSIS — K921 Melena: Secondary | ICD-10-CM

## 2021-12-01 LAB — CBC
HCT: 36.6 % — ABNORMAL LOW (ref 39.0–52.0)
Hemoglobin: 12.2 g/dL — ABNORMAL LOW (ref 13.0–17.0)
MCHC: 33.3 g/dL (ref 30.0–36.0)
MCV: 84.1 fl (ref 78.0–100.0)
Platelets: 272 10*3/uL (ref 150.0–400.0)
RBC: 4.35 Mil/uL (ref 4.22–5.81)
RDW: 14.4 % (ref 11.5–15.5)
WBC: 6.3 10*3/uL (ref 4.0–10.5)

## 2021-12-01 LAB — IBC + FERRITIN
Ferritin: 9.3 ng/mL — ABNORMAL LOW (ref 22.0–322.0)
Iron: 58 ug/dL (ref 42–165)
Saturation Ratios: 14.7 % — ABNORMAL LOW (ref 20.0–50.0)
TIBC: 394.8 ug/dL (ref 250.0–450.0)
Transferrin: 282 mg/dL (ref 212.0–360.0)

## 2021-12-01 MED ORDER — PANTOPRAZOLE SODIUM 40 MG PO TBEC: 40.0000 mg | DELAYED_RELEASE_TABLET | Freq: Every day | ORAL | 3 refills | Status: DC

## 2021-12-01 MED ORDER — ONDANSETRON 4 MG PO TBDP
4.0000 mg | ORAL_TABLET | Freq: Three times a day (TID) | ORAL | 0 refills | Status: DC | PRN
Start: 2021-12-01 — End: 2022-08-14

## 2021-12-01 NOTE — Progress Notes (Signed)
Chief Complaint  ?Patient presents with  ? Blood In Stools  ? ? ?Subjective: ?Patient is a 56 y.o. male here for blood in stool. Due to COVID-19 pandemic, we are interacting via web portal for an electronic face-to-face visit. I verified patient's ID using 2 identifiers. Patient agreed to proceed with visit via this method. Patient is at home, I am at office. Patient, in spouse, and I are present for visit.  ? ?He started having bright red blood in his stool around 2 weeks ago.  2 days ago it started to get more intense.  His stools also appear to be dark and tarry.  He has intermittent abdominal pain and nausea.  He will have intermittent lightheadedness when he is standing or walking.  He has not fallen or lost consciousness.  No vomiting.  He denies any trauma.  He had a colonoscopy in 2019 with Dr. Myrtie Neither.  5-year follow-up was recommended.  His father was diagnosed with colon cancer at age 4. ? ?Past Medical History:  ?Diagnosis Date  ? History of gout   ? Hypertension   ? MI (myocardial infarction) (HCC) 2004  ? Sleep apnea   ? "don't wear mask anymore"  ? Stroke [facility]) 05/27/2014  ? "right side still weaker" (05/31/2014)  ? ? ?Objective: ?No conversational dyspnea ?Age appropriate judgment and insight ?Nml affect and mood ? ?Assessment and Plan: ?Melena - Plan: Ambulatory referral to Gastroenterology, ondansetron (ZOFRAN-ODT) 4 MG disintegrating tablet, pantoprazole (PROTONIX) 40 MG tablet, CBC, IBC + Ferritin ? ?BRBPR (bright red blood per rectum) - Plan: Ambulatory referral to Gastroenterology, CBC, IBC + Ferritin ? ?New problems with uncertain prognosis.  We will place an urgent referral to gastroenterology for him to see Dr. Myrtie Neither.  Zofran as needed.  Protonix ordered due to possible upper GI bleed.  Check a stat CBC in addition to iron levels.  While he is having blood in his stool, he will take oral iron 3 times daily as his stomach will allow.  If he starts having worsening symptoms, he will seek  emergent care in the ER. ?The patient and his wife voiced understanding and agreement to the plan. ? ?Sharlene Dory, DO ?12/01/21  ?11:35 AM ? ? ? ? ?

## 2021-12-04 ENCOUNTER — Ambulatory Visit (INDEPENDENT_AMBULATORY_CARE_PROVIDER_SITE_OTHER): Payer: PRIVATE HEALTH INSURANCE | Admitting: Gastroenterology

## 2021-12-04 ENCOUNTER — Encounter: Payer: Self-pay | Admitting: Gastroenterology

## 2021-12-04 VITALS — BP 130/84 | HR 76 | Ht 69.0 in | Wt 213.0 lb

## 2021-12-04 DIAGNOSIS — K625 Hemorrhage of anus and rectum: Secondary | ICD-10-CM

## 2021-12-04 DIAGNOSIS — K6289 Other specified diseases of anus and rectum: Secondary | ICD-10-CM

## 2021-12-04 DIAGNOSIS — R112 Nausea with vomiting, unspecified: Secondary | ICD-10-CM

## 2021-12-04 DIAGNOSIS — R634 Abnormal weight loss: Secondary | ICD-10-CM

## 2021-12-04 MED ORDER — PLENVU 140 G PO SOLR: 140.0000 g | ORAL | 0 refills | Status: DC

## 2021-12-04 NOTE — Progress Notes (Addendum)
? ? ? ?[facility] Progress Note ? ?Chief Complaint: Rectal bleeding ? ?Subjective  ?History: ?Brian Velez was last seen here October 2021 by our APP with internal hemorrhoidal bleeding.  Therapy prescribed, consideration hemorrhoidal banding if symptoms persisted despite that. ? ?He had a telemedicine with primary care 3 days ago indicating the following: ?"He started having bright red blood in his stool around 2 weeks ago.  2 days ago it started to get more intense.  His stools also appear to be dark and tarry.  He has intermittent abdominal pain and nausea.  He will have intermittent lightheadedness when he is standing or walking.  He has not fallen or lost consciousness.  No vomiting.  He denies any trauma.  He had a colonoscopy in 2019 with Dr. Myrtie Neitheranis.  5-year follow-up was recommended.  His father was diagnosed with colon cancer at age 55." ? ?Last colonoscopy May 2019 with sigmoid diverticuli, no polyps, 5-year recall recommended due to his father's history of colon cancer. ? _________________ ? ? ?Brian Velez had a period of constipation at least 6 weeks ago that lasted for at least a week, after which he has had persistent rectal pain more painful when he sits as well as bleeding several times a day.  The bleeding began bright red blood and is now a darker color.  He has lower abdominal pain most of the time, intermittent nausea and postprandial vomiting.  He believes he is recently lost some weight as a result.  His stool is "mud like" not loose or watery.  He has not received antibiotics in recent months nor had any foreign travel or other sick contacts. ? ?(20 minutes late today - traffic) ?ROS: ?Cardiovascular:  no chest pain ?Respiratory: no dyspnea ? ?The patient's Past Medical, Family and Social History were reviewed and are on file in the EMR. ?Past Medical History:  ?Diagnosis Date  ? History of gout   ? Hypertension   ? MI (myocardial infarction) (HCC) 2004  ? Sleep apnea   ? "don't wear mask anymore"   ? Stroke [facility](HCC) 05/27/2014  ? "right side still weaker" (05/31/2014)  ? ?Past Surgical History:  ?Procedure Laterality Date  ? ANKLE FRACTURE SURGERY Left 2000's  ? CARDIAC CATHETERIZATION  2004; 2014  ? CYSTOSCOPY N/A 06/24/2021  ? Procedure: CYSTOSCOPY;  Surgeon: Belva AgeeNewsome, George B, MD;  Location: [facility];  Service: Urology;  Laterality: N/A;  ? HAND SURGERY    ? left palm and fingertip injury  ? ROUX-EN-Y GASTRIC BYPASS  2013  ? SKIN BIOPSY    ? TRANSURETHRAL RESECTION OF PROSTATE N/A 06/24/2021  ? Procedure: TRANSURETHRAL RESECTION OF THE PROSTATE (TURP);  Surgeon: Belva AgeeNewsome, George B, MD;  Location: [facility];  Service: Urology;  Laterality: N/A;  ? ?Family History  ?Problem Relation Age of Onset  ? Dementia Mother   ?     died of dementia  ? Breast cancer Mother   ? Hypertension Mother   ? Heart disease Mother   ? Cancer Mother   ?     breast  ? Diabetes Mother   ? CAD Father   ? Stroke Father   ? Prostate cancer Father [id number]  ? Hypertension Father   ? Diabetes Father   ? Heart disease Father   ? Colon cancer Father [id number]  ? Diabetes type II Sister   ? Hypertension Sister   ? Diabetes Sister   ? Diabetes type II Brother   ? Hypertension Brother   ? Diabetes Brother   ?  Colon polyps Brother   ? Diabetes type II Sister   ? Hypertension Sister   ? Diabetes Sister   ? Diabetes type II Sister   ? Hypertension Sister   ? Stroke Brother   ? Colon cancer Brother   ? Colon cancer Paternal Uncle   ? Colon cancer Paternal Uncle   ? Prostate cancer Paternal Uncle   ? Esophageal cancer Neg Hx   ? Rectal cancer Neg Hx   ? Stomach cancer Neg Hx   ? ? ?Objective: ? ?Med list reviewed ? ?Current Outpatient Medications:  ?  amitriptyline (ELAVIL) 50 MG tablet, TAKE 1 TABLET BY MOUTH EVERYDAY AT BEDTIME, Disp: 30 tablet, Rfl: 3 ?  amLODipine (NORVASC) 5 MG tablet, TAKE 1 TABLET (5 MG TOTAL) BY MOUTH DAILY., Disp: 90 tablet, Rfl: 1 ?  atorvastatin (LIPITOR) 40 MG tablet, TAKE 1 TABLET BY MOUTH EVERY DAY, Disp: 90 tablet, Rfl: 2 ?  carvedilol  (COREG) 12.5 MG tablet, TAKE 1 TABLET (12.5 MG TOTAL) BY MOUTH 2 (TWO) TIMES DAILY WITH A MEAL., Disp: 180 tablet, Rfl: 1 ?  finasteride (PROSCAR) 5 MG tablet, Take 5 mg by mouth daily., Disp: , Rfl:  ?  gabapentin (NEURONTIN) 300 MG capsule, TAKE 2 CAPSULES IN THE MORNING AND AFTERNOON. TAKE 3 IN THE EVENING., Disp: 300 capsule, Rfl: 3 ?  ondansetron (ZOFRAN-ODT) 4 MG disintegrating tablet, Take 1 tablet (4 mg total) by mouth every 8 (eight) hours as needed for nausea or vomiting., Disp: 20 tablet, Rfl: 0 ?  oxybutynin (DITROPAN XL) 5 MG 24 hr tablet, Take 1 tablet (5 mg total) by mouth at bedtime., Disp: 30 tablet, Rfl: 2 ?  pantoprazole (PROTONIX) 40 MG tablet, Take 1 tablet (40 mg total) by mouth daily., Disp: 30 tablet, Rfl: 3 ?  PEG-KCl-NaCl-NaSulf-Na Asc-C (PLENVU) 140 g SOLR, Take 140 g by mouth as directed., Disp: 1 each, Rfl: 0 ?  traZODone (DESYREL) 100 MG tablet, TAKE 1 TABLET BY MOUTH EVERYDAY AT BEDTIME, Disp: 90 tablet, Rfl: 2 ? ? ?Vital signs in last 24 hrs: ?Vitals:  ? 12/04/21 0946  ?BP: 130/84  ?Pulse: 76  ? ?Wt Readings from Last 3 Encounters:  ?12/04/21 213 lb (96.6 kg)  ?09/30/21 216 lb 12.8 oz (98.3 kg)  ?06/24/21 203 lb (92.1 kg)  ?  ?Physical Exam ?Wife present for entire visit ?He is clearly uncomfortable when sitting.  He is otherwise not acutely ill-appearing and he is well-hydrated with no muscle wasting. ?HEENT: sclera anicteric, oral mucosa moist without lesions ?Neck: supple, no thyromegaly, JVD or lymphadenopathy ?Cardiac: RRR without murmurs, S1S2 heard, no peripheral edema ?Pulm: clear to auscultation bilaterally, normal RR and effort noted ?Abdomen: soft, bandlike mid to lower abdominal tenderness, with active bowel sounds. No guarding or palpable hepatosplenomegaly.  No distention ?Skin; warm and dry, no jaundice or rash ?Rectal: Normal perianal exam.  He was tense and did not tolerate a rectal exam well due to discomfort, but I was able to complete the DRE and did not appreciate  a definite fissure.  There were no palpable internal lesions. ?Labs: ? ? ?  Latest Ref Rng & Units 12/01/2021  ? 12:25 PM 06/19/2021  ?  9:40 AM 12/25/2020  ?  9:30 AM  ?CBC  ?WBC 4.0 - 10.5 K/uL 6.3   7.5   7.0    ?Hemoglobin 13.0 - 17.0 g/dL 16.1   09.6   04.5    ?Hematocrit 39.0 - 52.0 % 36.6   39.9   38.7    ?  Platelets 150.0 - 400.0 K/uL 272.0   329   272.0    ? ?Iron/TIBC/Ferritin/ %Sat ?   ?Component Value Date/Time  ? IRON 58 12/01/2021 1225  ? TIBC 394.8 12/01/2021 1225  ? FERRITIN 9.3 (L) 12/01/2021 1225  ? IRONPCTSAT 14.7 (L) 12/01/2021 1225  ? ? ?___________________________________________ ?Radiologic studies: ? ? ?____________________________________________ ?Other: ? ? ?_____________________________________________ ?Assessment & Plan  ?Assessment: ?Encounter Diagnoses  ?Name Primary?  ? Rectal bleeding Yes  ? Rectal pain   ? Nausea and vomiting in adult   ? 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 ? ?While he has known internal hemorrhoids, he would clearly not have all the symptoms from that diagnosis.  I thought he might have a fissure based on description and onset of symptoms after a period of constipation with straining, though he did not have an obvious one on exam.  It may be that is somewhat deeper than appreciated by DRE, he did not have much relief after applying RectiCare ointment to that area. ?He has a family history of colon cancer,, but the symptoms seem to be of rather acute onset about 6 weeks ago.  I am concerned he may have colitis of some kind.  He is also iron deficient indicating this bleeding is been going on a while.  Fortunately hemoglobin only mildly decreased.(Anemia may also be partially from prior GBP) ? ?Plan: ?He needs a colonoscopy very soon, and fortunately I had a slot open in my hospital outpatient endoscopy block next week.  He was agreeable after discussion of procedure  and risks. ? The benefits and risks of the planned procedure were described in detail with the patient or (when appropriate) their health care proxy.  Risks were outlined as including, but not limited t

## 2021-12-04 NOTE — Patient Instructions (Signed)
If you are age 55 or older, your body mass index should be between 23-30. Your Body mass index is 31.45 kg/m?Marland Kitchen If this is out of the aforementioned range listed, please consider follow up with your Primary Care Provider. ? ?If you are age 18 or younger, your body mass index should be between 19-25. Your Body mass index is 31.45 kg/m?Marland Kitchen If this is out of the aformentioned range listed, please consider follow up with your Primary Care Provider.  ? ?________________________________________________________ ? ?The [facility] providers would like to encourage you to use [facility] to communicate with providers for non-urgent requests or questions.  Due to long hold times on the telephone, sending your provider a message by [facility] may be a faster and more efficient way to get a response.  Please allow 48 business hours for a response.  Please remember that this is for non-urgent requests.  ?_______________________________________________________ ? ?You have been scheduled for a colonoscopy. Please follow written instructions given to you at your visit today.  ?Please pick up your prep supplies at the pharmacy within the next 1-3 days. ?If you use inhalers (even only as needed), please bring them with you on the day of your procedure. ? ?It was a pleasure to see you today! ? ?Thank you for trusting me with your gastrointestinal care!   ? ? ?

## 2021-12-08 ENCOUNTER — Telehealth: Payer: Self-pay

## 2021-12-08 ENCOUNTER — Ambulatory Visit (HOSPITAL_BASED_OUTPATIENT_CLINIC_OR_DEPARTMENT_OTHER): Payer: Self-pay | Admitting: Anesthesiology

## 2021-12-08 ENCOUNTER — Encounter (HOSPITAL_COMMUNITY): Payer: Self-pay | Admitting: Gastroenterology

## 2021-12-08 ENCOUNTER — Other Ambulatory Visit: Payer: Self-pay

## 2021-12-08 ENCOUNTER — Other Ambulatory Visit: Payer: Self-pay | Admitting: Gastroenterology

## 2021-12-08 ENCOUNTER — Encounter (HOSPITAL_COMMUNITY)
Admission: RE | Disposition: A | Discharge status: Home / Self Care | Payer: Self-pay | Source: Ambulatory Visit | Attending: Gastroenterology

## 2021-12-08 ENCOUNTER — Ambulatory Visit (HOSPITAL_COMMUNITY)
Admission: RE | Admit: 2021-12-08 | Discharge: 2021-12-08 | Disposition: A | Discharge status: Home / Self Care | Payer: Self-pay | Source: Ambulatory Visit | Attending: Gastroenterology | Admitting: Gastroenterology

## 2021-12-08 ENCOUNTER — Ambulatory Visit (HOSPITAL_COMMUNITY): Payer: Self-pay | Admitting: Anesthesiology

## 2021-12-08 DIAGNOSIS — Z6831 Body mass index (BMI) 31.0-31.9, adult: Secondary | ICD-10-CM | POA: Insufficient documentation

## 2021-12-08 DIAGNOSIS — I10 Essential (primary) hypertension: Secondary | ICD-10-CM | POA: Insufficient documentation

## 2021-12-08 DIAGNOSIS — K625 Hemorrhage of anus and rectum: Secondary | ICD-10-CM

## 2021-12-08 DIAGNOSIS — K640 First degree hemorrhoids: Secondary | ICD-10-CM | POA: Insufficient documentation

## 2021-12-08 DIAGNOSIS — R1030 Lower abdominal pain, unspecified: Secondary | ICD-10-CM

## 2021-12-08 DIAGNOSIS — K648 Other hemorrhoids: Secondary | ICD-10-CM

## 2021-12-08 DIAGNOSIS — K6289 Other specified diseases of anus and rectum: Secondary | ICD-10-CM

## 2021-12-08 DIAGNOSIS — I25119 Atherosclerotic heart disease of native coronary artery with unspecified angina pectoris: Secondary | ICD-10-CM

## 2021-12-08 DIAGNOSIS — G4730 Sleep apnea, unspecified: Secondary | ICD-10-CM | POA: Insufficient documentation

## 2021-12-08 DIAGNOSIS — I252 Old myocardial infarction: Secondary | ICD-10-CM | POA: Insufficient documentation

## 2021-12-08 DIAGNOSIS — R634 Abnormal weight loss: Secondary | ICD-10-CM

## 2021-12-08 DIAGNOSIS — R112 Nausea with vomiting, unspecified: Secondary | ICD-10-CM

## 2021-12-08 DIAGNOSIS — E669 Obesity, unspecified: Secondary | ICD-10-CM | POA: Insufficient documentation

## 2021-12-08 DIAGNOSIS — K5900 Constipation, unspecified: Secondary | ICD-10-CM | POA: Insufficient documentation

## 2021-12-08 DIAGNOSIS — I2510 Atherosclerotic heart disease of native coronary artery without angina pectoris: Secondary | ICD-10-CM | POA: Insufficient documentation

## 2021-12-08 DIAGNOSIS — Z8673 Personal history of transient ischemic attack (TIA), and cerebral infarction without residual deficits: Secondary | ICD-10-CM | POA: Insufficient documentation

## 2021-12-08 HISTORY — PX: COLONOSCOPY WITH PROPOFOL: SHX5780

## 2021-12-08 SURGERY — COLONOSCOPY WITH PROPOFOL
Anesthesia: Monitor Anesthesia Care

## 2021-12-08 MED ORDER — PROPOFOL 500 MG/50ML IV EMUL
INTRAVENOUS | Status: DC | PRN
  Administered 2021-12-08: 125 ug/kg/min via INTRAVENOUS

## 2021-12-08 MED ORDER — PROPOFOL 10 MG/ML IV BOLUS
INTRAVENOUS | Status: AC
  Filled 2021-12-08: qty 20

## 2021-12-08 MED ORDER — PROPOFOL 10 MG/ML IV BOLUS
INTRAVENOUS | Status: DC | PRN
  Administered 2021-12-08: 30 mg via INTRAVENOUS
  Administered 2021-12-08: 20 mg via INTRAVENOUS

## 2021-12-08 MED ORDER — ONDANSETRON HCL 4 MG/2ML IJ SOLN
INTRAMUSCULAR | Status: DC | PRN
  Administered 2021-12-08: 4 mg via INTRAVENOUS

## 2021-12-08 MED ORDER — LACTATED RINGERS IV SOLN
INTRAVENOUS | Status: DC
  Administered 2021-12-08: 1000 mL via INTRAVENOUS

## 2021-12-08 MED ORDER — METOPROLOL TARTRATE 5 MG/5ML IV SOLN
INTRAVENOUS | Status: DC | PRN
  Administered 2021-12-08: 2 mg via INTRAVENOUS

## 2021-12-08 MED ORDER — SODIUM CHLORIDE 0.9 % IV SOLN: INTRAVENOUS | Status: DC

## 2021-12-08 MED ORDER — HYDROCORTISONE ACETATE 25 MG RE SUPP: 25.0000 mg | Freq: Two times a day (BID) | RECTAL | 1 refills | Status: DC

## 2021-12-08 MED ORDER — PROPOFOL 500 MG/50ML IV EMUL
INTRAVENOUS | Status: AC
Start: 2021-12-08 — End: ?
  Filled 2021-12-08: qty 50

## 2021-12-08 SURGICAL SUPPLY — 22 items

## 2021-12-08 NOTE — Interval H&P Note (Signed)
History and Physical Interval Note: ? ?12/08/2021 ?7:33 AM ? ?Brian Velez  has presented today for surgery, with the diagnosis of rectal bleeding, rectal pain.  The various methods of treatment have been discussed with the patient and family. After consideration of risks, benefits and other options for treatment, the patient has consented to  Procedure(s): ?COLONOSCOPY WITH PROPOFOL (N/A) as a surgical intervention.  The patient's history has been reviewed, patient examined, no change in status, stable for surgery.  I have reviewed the patient's chart and labs.  Questions were answered to the patient's satisfaction.   ? ? ?Charlie Pitter III ? ? ?

## 2021-12-08 NOTE — Anesthesia Preprocedure Evaluation (Signed)
Anesthesia Evaluation  ?Patient identified by MRN, date of birth, ID band ?Patient awake ? ? ? ?Reviewed: ?Allergy & Precautions, NPO status , Patient's Chart, lab work & pertinent test results, reviewed documented beta blocker date and time  ? ?Airway ?Mallampati: I ? ?TM Distance: >3 FB ?Neck ROM: Full ? ? ? Dental ?no notable dental hx. ?(+) Teeth Intact, Dental Advisory Given ?  ?Pulmonary ?sleep apnea (does not wear CPAP) ,  ?  ?Pulmonary exam normal ?breath sounds clear to auscultation ? ? ? ? ? ? Cardiovascular ?hypertension, Pt. on home beta blockers and Pt. on medications ?+ angina + CAD and + Past MI  ?Normal cardiovascular exam ?Rhythm:Regular Rate:Normal ? ?TTE 2021 ??1. Left ventricular ejection fraction, by estimation, is 55 to 60%. The  ?left ventricle has normal function. The left ventricle has no regional  ?wall motion abnormalities. There is moderate concentric left ventricular  ?hypertrophy. Left ventricular  ?diastolic parameters are consistent with Grade II diastolic dysfunction  ?(pseudonormalization). Elevated left ventricular end-diastolic pressure.  ??2. Right ventricular systolic function is normal. The right ventricular  ?size is normal. There is normal pulmonary artery systolic pressure.  ??3. Left atrial size was mildly dilated.  ??4. The mitral valve is normal in structure. Trivial mitral valve  ?regurgitation. No evidence of mitral stenosis.  ??5. The aortic valve is tricuspid. Aortic valve regurgitation is not  ?visualized. No aortic stenosis is present.  ??6. The inferior vena cava is normal in size with greater than 50%  ?respiratory variability, suggesting right atrial pressure of 3 mmHg. ? ?Stress Test 2021 ?The left ventricular ejection fraction is normal (55-65%). ?Nuclear stress EF: 58%. ?There was no ST segment deviation noted during stress. ?The study is normal. ?This is a low risk study. ? ?  ?Neuro/Psych ?CVA, No Residual Symptoms negative  psych ROS  ? GI/Hepatic ?negative GI ROS, Neg liver ROS,   ?Endo/Other  ?negative endocrine ROS ? Renal/GU ?negative Renal ROS  ?negative genitourinary ?  ?Musculoskeletal ? ?(+) Arthritis , Osteoarthritis,   ? Abdominal ?(+) + obese,   ?Peds ? Hematology ?negative hematology ROS ?(+)   ?Anesthesia Other Findings ? ? Reproductive/Obstetrics ? ?  ? ? ? ? ? ? ? ? ? ? ? ? ? ?  ?  ? ? ? ? ? ? ? ? ?Anesthesia Physical ? ?Anesthesia Plan ? ?ASA: 3 ? ?Anesthesia Plan: MAC  ? ?Post-op Pain Management: Minimal or no pain anticipated  ? ?Induction: Intravenous ? ?PONV Risk Score and Plan: 1 and Ondansetron and Treatment may vary due to age or medical condition ? ?Airway Management Planned: Simple Face Mask ? ?Additional Equipment:  ? ?Intra-op Plan:  ? ?Post-operative Plan:  ? ?Informed Consent: I have reviewed the patients History and Physical, chart, labs and discussed the procedure including the risks, benefits and alternatives for the proposed anesthesia with the patient or authorized representative who has indicated his/her understanding and acceptance.  ? ? ? ?Dental advisory given ? ?Plan Discussed with: CRNA ? ?Anesthesia Plan Comments:   ? ? ? ? ? ? ?Anesthesia Quick Evaluation ? ?

## 2021-12-08 NOTE — Anesthesia Postprocedure Evaluation (Signed)
Anesthesia Post Note ? ?Patient: CALYB MCQUARRIE ? ?Procedure(s) Performed: COLONOSCOPY WITH PROPOFOL ? ?  ? ?Patient location during evaluation: PACU ?Anesthesia Type: MAC ?Level of consciousness: awake and alert ?Pain management: pain level controlled ?Vital Signs Assessment: post-procedure vital signs reviewed and stable ?Respiratory status: spontaneous breathing, nonlabored ventilation and respiratory function stable ?Cardiovascular status: blood pressure returned to baseline and stable ?Postop Assessment: no apparent nausea or vomiting ?Anesthetic complications: no ? ? ?No notable events documented. ? ?Last Vitals:  ?Vitals:  ? 12/08/21 0848 12/08/21 0849  ?BP:    ?Pulse: (!) 104 (!) 105  ?Resp: (!) 25 (!) 26  ?Temp:    ?SpO2: 95% 94%  ?  ?Last Pain:  ?Vitals:  ? 12/08/21 0819  ?TempSrc: Temporal  ?PainSc: 0-No pain  ? ? ?  ?  ?  ?  ?  ?  ? ?Lynda Rainwater ? ? ? ? ?

## 2021-12-08 NOTE — H&P (Signed)
History and Physical: ? This patient presents for endoscopic testing for: ? ? ?Rectal pain and bleeding ?Clinical details in [facility] office consult note 12/04/2021 ? ? ?Patient is otherwise without complaints or active issues today. ? ? ?Past Medical History: ?Past Medical History:  ?Diagnosis Date  ? History of gout   ? Hypertension   ? MI (myocardial infarction) (HCC) 2004  ? Sleep apnea   ? "don't wear mask anymore"  ? Stroke [facility]) 05/27/2014  ? "right side still weaker" (05/31/2014)  ? ? ? ?Past Surgical History: ?Past Surgical History:  ?Procedure Laterality Date  ? ANKLE FRACTURE SURGERY Left 2000's  ? CARDIAC CATHETERIZATION  2004; 2014  ? CYSTOSCOPY N/A 06/24/2021  ? Procedure: CYSTOSCOPY;  Surgeon: [name], MD;  Location: [facility];  Service: Urology;  Laterality: N/A;  ? HAND SURGERY    ? left palm and fingertip injury  ? ROUX-EN-Y GASTRIC BYPASS  2013  ? SKIN BIOPSY    ? TRANSURETHRAL RESECTION OF PROSTATE N/A 06/24/2021  ? Procedure: TRANSURETHRAL RESECTION OF THE PROSTATE (TURP);  Surgeon: [name], MD;  Location: [facility];  Service: Urology;  Laterality: N/A;  ? ? ?Allergies: ?No Known Allergies ? ?Outpatient Meds: ?Current Facility-Administered Medications  ?Medication Dose Route Frequency Provider Last Rate Last Admin  ? 0.9 %  sodium chloride infusion   Intravenous Continuous Danis, Starr [place] III, MD      ? lactated ringers infusion   Intravenous Continuous Charlie Pitter III, MD 10 mL/hr at 12/08/21 0648 1,000 mL at 12/08/21 0354  ? ? ? ? ?___________________________________________________________________ ?Objective  ? ?Exam: ? ?BP (!) 170/104   Temp 98.1 ?F (36.7 ?C) (Oral)   Resp (!) 28   Ht 5\' 9"  (1.753 m)   Wt 96.6 kg   SpO2 94%   BMI 31.45 kg/m?  ? ?CV: RRR without murmur, S1/S2 ?Resp: clear to auscultation bilaterally, normal RR and effort noted ?GI: soft, no tenderness, with active bowel sounds. ? ? ?Assessment: ?Rectal pain ?Rectal  bleeding ? ? ?Plan: ?Colonoscopy ? ? ? ?The patient is appropriate for an endoscopic procedure in the ambulatory setting. ? ? - , MD ? ? ? ? ? ?

## 2021-12-08 NOTE — Transfer of Care (Signed)
Immediate Anesthesia Transfer of Care Note ? ?Patient: Brian Velez ? ?Procedure(s) Performed: COLONOSCOPY WITH PROPOFOL ? ?Patient Location: PACU and Endoscopy Unit ? ?Anesthesia Type:MAC ? ?Level of Consciousness: awake, drowsy and patient cooperative ? ?Airway & Oxygen Therapy: Patient Spontanous Breathing and Patient connected to face mask oxygen ? ?Post-op Assessment: Report given to RN and Post -op Vital signs reviewed and stable ? ?Post vital signs: Reviewed and stable ? ?Last Vitals:  ?Vitals Value Taken Time  ?BP 145/87 12/08/21 0818  ?Temp    ?Pulse 102 12/08/21 0818  ?Resp 24 12/08/21 0818  ?SpO2 95 % 12/08/21 0818  ?Vitals shown include unvalidated device data. ? ?Last Pain:  ?Vitals:  ? 12/08/21 0637  ?TempSrc: Oral  ?PainSc: 0-No pain  ?   ? ?  ? ?Complications: No notable events documented. ?

## 2021-12-08 NOTE — Anesthesia Procedure Notes (Signed)
Procedure Name: [place] ?Date/Time: 12/08/2021 7:42 AM ?Performed by: Lollie Sails, CRNA ?Pre-anesthesia Checklist: Patient identified, Emergency Drugs available, Suction available, Patient being monitored and Timeout performed ?Oxygen Delivery Method: Simple face mask ?Placement Confirmation: positive ETCO2 ? ? ? ? ?

## 2021-12-08 NOTE — Telephone Encounter (Signed)
CT order in epic. Secure staff message sent to radiology scheduling to contact pt to set up his appt. ?

## 2021-12-08 NOTE — Telephone Encounter (Signed)
-----   Message from [place], MD sent at 12/08/2021 10:14 AM EDT ----- ?Regarding: Post procedure order ?[place], ? ? ? ?I saw this patient for colonoscopy at [facility] [address] [facility] today. ? ?I have already placed the order for Anusol HC suppositories, but he needs a CT scan abdomen and pelvis with oral and IV contrast arranged.  Indications are lower abdominal pain, rectal pain, rectal bleeding, weight loss. ? ?HD ? ?

## 2021-12-08 NOTE — Op Note (Signed)
[facility] ?Patient Name: Brian Velez ?Procedure Date: 12/08/2021 ?MRN: [id number] ?Attending MD: Estill Cotta. Loletha Carrow , MD ?Date of Birth: 07-Jun-1966 ?CSN: [id number] ?Age: 56 ?Admit Type: Outpatient ?Procedure:                Colonoscopy ?Indications:              Lower abdominal pain, Rectal bleeding,  ?                          Constipation, Rectal pain (all recent onset though  ?                          with Hx hemorrhoidal bleeding) ?Providers:                Estill Cotta. Loletha Carrow, MD, Kary Kos RN, RN, Jacquelin Hawking  ?                          [place]oule, Technician ?Referring MD:             Shelda Pal ?Medicines:                Monitored Anesthesia Care ?Complications:            No immediate complications. ?Estimated Blood Loss:     Estimated blood loss: none. ?Procedure:                Pre-Anesthesia Assessment: ?                          - Prior to the procedure, a History and Physical  ?                          was performed, and patient medications and  ?                          allergies were reviewed. The patient's tolerance of  ?                          previous anesthesia was also reviewed. The risks  ?                          and benefits of the procedure and the sedation  ?                          options and risks were discussed with the patient.  ?                          All questions were answered, and informed consent  ?                          was obtained. Prior Anticoagulants: The patient has  ?                          taken no previous anticoagulant or antiplatelet  ?  agents. ASA Grade Assessment: II - A patient with  ?                          mild systemic disease. After reviewing the risks  ?                          and benefits, the patient was deemed in  ?                          satisfactory condition to undergo the procedure. ?                          After obtaining informed consent, the colonoscope  ?                          was passed  under direct vision. Throughout the  ?                          procedure, the patient's blood pressure, pulse, and  ?                          oxygen saturations were monitored continuously. The  ?                          [id number] ([id number]) Olympus colonoscope was  ?                          introduced through the anus and advanced to the the  ?                          terminal ileum, with identification of the  ?                          appendiceal orifice and IC valve. The colonoscopy  ?                          was performed with difficulty due to poor bowel  ?                          prep, a redundant colon and significant looping.  ?                          Successful completion of the procedure was aided by  ?                          changing the patient to a semi-prone position,  ?                          using manual pressure, straightening and shortening  ?                          the scope to obtain bowel loop reduction and  ?  lavage. The patient tolerated the procedure well.  ?                          The quality of the bowel preparation was poor. The  ?                          terminal ileum, ileocecal valve, appendiceal  ?                          orifice, and rectum were photographed. The bowel  ?                          preparation used was Plenvu. ?Scope In: 7:48:51 AM ?Scope Out: 8:10:53 AM ?Scope Withdrawal Time: 0 hours 9 minutes 4 seconds  ?Total Procedure Duration: 0 hours 22 minutes 2 seconds  ?Findings: ?     The perianal and digital rectal examinations were normal. ?     A large amount of semi-liquid and adherent stool was found in the entire  ?     colon, interfering with visualization despite lavage. ?     The terminal ileum appeared normal. ?     Internal hemorrhoids were found. The hemorrhoids were Grade I (internal  ?     hemorrhoids that do not prolapse). ?     The exam was otherwise without abnormality on direct and retroflexion  ?     views.  (given limitations due to prep quality) ?Impression:               - Preparation of the colon was poor. ?                          - Stool in the entire examined colon. ?                          - The examined portion of the ileum was normal. ?                          - Internal hemorrhoids. ?                          - The examination was otherwise normal on direct  ?                          and retroflexion views. ?                          - No specimens collected. ?Moderate Sedation: ?     MAC sedation used ?Recommendation:           - Patient has a contact number available for  ?                          emergencies. The signs and symptoms of potential  ?                          delayed complications were discussed with the  ?  patient. Return to normal activities tomorrow.  ?                          Written discharge instructions were provided to the  ?                          patient. ?                          - Resume previous diet. ?                          - Continue present medications. ?                          - Miralax 1 capful (17 grams) in 8 ounces of water  ?                          PO daily. ?                          - Anusol HC suppository twice daily x 10 days ?                          - Repeat colonoscopy in 1 year for screening  ?                          purposes. (family history of colon cancer) -  ?                          Ducolax/Golytely prep for next exam ?                          - Return to my office at appointment to be  ?                          scheduled. ?                          - Perform a CT scan (computed tomography) of  ?                          abdomen with contrast and pelvis with contrast at  ?                          appointment to be scheduled. ?Procedure Code(s):        --- Professional --- ?                          [phone], Colonoscopy, flexible; diagnostic, including  ?                          collection of specimen(s) by  brushing or washing,  ?                          when performed (separate  procedure) ?Diagnosis Code(s):        --- Professional --- ?                          K64.0, First degree hemorrhoids ?                          R10.30, Lower abdominal pain, unspecified ?                          K62.5, Hemorrhage of anus and rectum ?                          K59.00, Constipation, unspecified ?                          K62.89, Other specified diseases of anus and rectum ?CPT copyright 2019 [employer]. All rights reserved. ?The codes documented in this report are preliminary and upon coder review may  ?be revised to meet current compliance requirements. ?John Vasconcelos L. Loletha Carrow, MD ?12/08/2021 8:24:10 AM ?This report has been signed electronically. ?Number of Addenda: 0 ?

## 2021-12-09 ENCOUNTER — Encounter (HOSPITAL_COMMUNITY): Payer: Self-pay | Admitting: Gastroenterology

## 2022-01-02 ENCOUNTER — Ambulatory Visit (HOSPITAL_BASED_OUTPATIENT_CLINIC_OR_DEPARTMENT_OTHER)
Admission: RE | Admit: 2022-01-02 | Discharge: 2022-01-02 | Disposition: A | Discharge status: Home / Self Care | Payer: PRIVATE HEALTH INSURANCE | Source: Ambulatory Visit | Attending: Gastroenterology | Admitting: Gastroenterology

## 2022-01-02 DIAGNOSIS — K625 Hemorrhage of anus and rectum: Secondary | ICD-10-CM | POA: Insufficient documentation

## 2022-01-02 DIAGNOSIS — R1030 Lower abdominal pain, unspecified: Secondary | ICD-10-CM | POA: Diagnosis present

## 2022-01-02 DIAGNOSIS — R634 Abnormal weight loss: Secondary | ICD-10-CM | POA: Diagnosis present

## 2022-01-02 DIAGNOSIS — K6289 Other specified diseases of anus and rectum: Secondary | ICD-10-CM | POA: Insufficient documentation

## 2022-01-02 MED ORDER — IOHEXOL 300 MG/ML  SOLN
100.0000 mL | Freq: Once | INTRAMUSCULAR | Status: AC | PRN
  Administered 2022-01-02: 100 mL via INTRAVENOUS

## 2022-01-06 ENCOUNTER — Telehealth: Payer: Self-pay | Admitting: Family Medicine

## 2022-01-06 NOTE — Telephone Encounter (Signed)
Called the patient and scheduled with PCP in the morning/01-07-22 at 9:45 AM. Patient agreed to go to nearest ER if anything changes/worsens before appointment tomorrow 01/07/22.

## 2022-01-06 NOTE — Telephone Encounter (Signed)
Initial Comment Caller states his bp has been high this week. This morning was 137/108. He has a headache over his left eye. Translation No Disp. Time Eilene Ghazi Time) Disposition Final User 01/06/2022 2:59:47 PM Attempt made - message left [place], RN, Estill Bamberg 01/06/2022 3:09:13 PM Attempt made - no message left [place], RN, Estill Bamberg 01/06/2022 3:25:56 PM FINAL ATTEMPT MADE - no message left Yes [place], RN, Estill Bamberg

## 2022-01-06 NOTE — Telephone Encounter (Signed)
Pt was called in reference to his appt notes for appt made via MyChart. Pt stated his last two BP readings were as follows:  182/110 on 5.28.23 127/107 on 5.29.23 Evening  Pt also states he has been having severe headaches over his left eye for the past week now. Pt was transferred to triage nurse for further eval.

## 2022-01-07 ENCOUNTER — Ambulatory Visit (INDEPENDENT_AMBULATORY_CARE_PROVIDER_SITE_OTHER): Payer: PRIVATE HEALTH INSURANCE | Admitting: Family Medicine

## 2022-01-07 ENCOUNTER — Encounter: Payer: Self-pay | Admitting: Family Medicine

## 2022-01-07 VITALS — BP 142/94 | HR 82 | Temp 98.5°F | Ht 71.0 in | Wt 212.1 lb

## 2022-01-07 DIAGNOSIS — I10 Essential (primary) hypertension: Secondary | ICD-10-CM | POA: Diagnosis not present

## 2022-01-07 DIAGNOSIS — R531 Weakness: Secondary | ICD-10-CM | POA: Diagnosis not present

## 2022-01-07 MED ORDER — OLMESARTAN MEDOXOMIL 20 MG PO TABS: 20.0000 mg | ORAL_TABLET | Freq: Every day | ORAL | 2 refills | Status: DC

## 2022-01-07 MED ORDER — DIAZEPAM 10 MG PO TABS: ORAL_TABLET | ORAL | 0 refills | Status: DC

## 2022-01-07 NOTE — Patient Instructions (Signed)
Check your blood pressures 2-3 times per week, alternating the time of day you check it. If it is high, considering waiting 1-2 minutes and rechecking. If it gets higher, your anxiety is likely creeping up and we should avoid rechecking.   Keep the diet clean and stay active.  Someone will reach out regarding your MRI.   Let us know if you need anything.

## 2022-01-07 NOTE — Progress Notes (Signed)
Chief Complaint  Patient presents with   Headache    Hypertension since Sunday    Subjective Brian Velez is a 56 y.o. male who presents for hypertension follow up. He does monitor home blood pressures. Blood pressures ranging from 140-150's/100's on average. He is compliant with medications- Norvasc 5 mg/d, Coreg 12.5 mg bid, . Patient has these side effects of medication: none He is adhering to a healthy diet overall. Current exercise: walking No CP. Having some DOE. Having L sided frontal headaches. Having L sided weakness.  All of these associated s/s's started last Th when his blood pressure increased. He has a hx of a CVA several years ago, no residual effects.     Past Medical History:  Diagnosis Date   History of gout    Hypertension    MI (myocardial infarction) (HCC) 2004   Sleep apnea    "don't wear mask anymore"   Stroke (HCC) 05/27/2014   "right side still weaker" (05/31/2014)    Exam BP (!) 142/94   Pulse 82   Temp 98.5 F (36.9 C) (Oral)   Ht 5\' 11"  (1.803 m)   Wt 212 lb 2 oz (96.2 kg)   SpO2 99%   BMI 29.59 kg/m  General:  well developed, well nourished, in no apparent distress Heart: RRR, no bruits, no LE edema Lungs: clear to auscultation, no accessory muscle use Neuro: DTR's 0/4 patellar b/l, 1/4 biceps reflex b/l, no clonus, no cerebellar signs, gait is steady/cautious, 4/5 grip strength, ext/int rotation, elbow flexion/extension, knee flexion/ext and hip flexion on the L, 5/5 strength throughout on the R Psych: well oriented with normal range of affect and appropriate judgment/insight  Essential hypertension - Plan: EKG 12-Lead, olmesartan (BENICAR) 20 MG tablet  Left-sided weakness - Plan: MR Brain Wo Contrast, diazepam (VALIUM) 10 MG tablet  Chronic, unstable. Cont Norvasc 5 mg/d, Coreg 12.5 mg bid, add Benicar 20 mg/d. F/u in 1 week, will reck labs at that time. I think this will help w headaches, possibly fatigue. Counseled on diet and  exercise. EKG shows NSR, normal axis, Wide QRS in V1 and lead III no other interval abnormalities, no ST segment or T wave changes, good R wave progression.  New problem, uncertain prog. Ck MRI for possible CVA. Valium to help sedate for MRI, will need driver, he will use his wife for this.  The patient voiced understanding and agreement to the plan.  [place], DO 01/07/22  10:44 AM

## 2022-01-08 ENCOUNTER — Ambulatory Visit: Payer: PRIVATE HEALTH INSURANCE | Admitting: Family Medicine

## 2022-01-09 ENCOUNTER — Telehealth: Payer: Self-pay | Admitting: Gastroenterology

## 2022-01-09 MED ORDER — LINACLOTIDE 72 MCG PO CAPS: 72.0000 ug | ORAL_CAPSULE | Freq: Every day | ORAL | 0 refills | Status: DC

## 2022-01-09 NOTE — Telephone Encounter (Signed)
Inbound call from patients wife stating she would like to schedule patients MRI. Please advise.

## 2022-01-09 NOTE — Telephone Encounter (Signed)
Returned call to patient's wife. I informed her that she will need to contact the patient's PCP because they are the ones that ordered the MRI brain. I told pt's wife that we did try to reach him earlier this week to review his CT results. I reviewed 5/26 CT results and recommendations with patient's wife. She states that they will come by and pick up the Linzess samples. Pt's wife knows that samples need to be picked up from the 2nd floor receptionist desk between 8 am- 4:30 pm. She knows to contact us if the Linzess works for the patient and we will send in a prescription. Pt has been scheduled for a f/u appt with Dr. Myrtie Neither on Wednesday, 02/11/22 at 1:40 pm. Pt's wife verbalized understanding of all information and had no concerns at the end of the call.  Linzess 72 mcg samples placed at 2nd floor receptionist desk.

## 2022-01-13 ENCOUNTER — Other Ambulatory Visit: Payer: Self-pay | Admitting: Family Medicine

## 2022-01-13 DIAGNOSIS — M4802 Spinal stenosis, cervical region: Secondary | ICD-10-CM

## 2022-01-13 DIAGNOSIS — M79605 Pain in left leg: Secondary | ICD-10-CM

## 2022-01-28 ENCOUNTER — Ambulatory Visit (INDEPENDENT_AMBULATORY_CARE_PROVIDER_SITE_OTHER): Payer: PRIVATE HEALTH INSURANCE | Admitting: Family Medicine

## 2022-01-28 ENCOUNTER — Telehealth: Payer: Self-pay

## 2022-01-28 ENCOUNTER — Encounter: Payer: Self-pay | Admitting: Family Medicine

## 2022-01-28 VITALS — BP 122/82 | HR 65 | Temp 98.0°F | Ht 66.0 in | Wt 207.5 lb

## 2022-01-28 DIAGNOSIS — I10 Essential (primary) hypertension: Secondary | ICD-10-CM | POA: Diagnosis not present

## 2022-01-28 MED ORDER — HYDROCHLOROTHIAZIDE 25 MG PO TABS: 25.0000 mg | ORAL_TABLET | Freq: Every day | ORAL | 3 refills | Status: DC

## 2022-01-28 NOTE — Telephone Encounter (Signed)
Nurse Assessment Nurse: Annye English RN, Denise Date/Time (Eastern Time): 01/28/2022 9:59:09 AM Confirm and document reason for call. If symptomatic, describe symptoms. ---Caller states he has had a headache for 3 days. [facility] checked [facility] blood pressure this morning and it was 140/122. Pt states he has a bp cuff at home and can ck the BP. Advised to ck in both arms now and RN will cb. Does the patient have any new or worsening symptoms? ---Yes Will a triage be completed? ---No Select reason for no triage. ---Other Please document clinical information provided and list any resource used. ---RN to cb to further assess/triage Disp. Time Lamount Cohen Time) Disposition Final User 01/28/2022 9:56:21 AM Send to Urgent Queue [place], Rosey Bath 01/28/2022 10:04:11 AM Attempt made - message left [place], RN, Angelique Blonder 01/28/2022 10:04:36 AM Send To RN Personal Annye English, RN, Denise 01/28/2022 10:06:49 AM Attempt made - message left Carmon, RN, Angelique Blonder 01/28/2022 10:12:26 AM Attempt made - message left Carmon, RN, Angelique Blonder 01/28/2022 10:26:09 AM FINAL ATTEMPT MADE - message left Yes Carmon, RN, Angelique Blonder

## 2022-01-28 NOTE — Progress Notes (Signed)
Chief Complaint  Patient presents with   Hypertension    Headaches Tingling in left hand     Subjective Brian Velez is a 56 y.o. male who presents for hypertension follow up.  Here with his spouse. He does monitor home blood pressures. Blood pressures ranging from 130-180's/70-110's on average. He is compliant with medications-Benicar 20 mg daily, Norvasc 5 mg daily, carvedilol 12.5 mg twice daily.  He was out of his medicine all last week. Patient has these side effects of medication: none He is sometimes adhering to a healthy diet overall. Current exercise: none No CP or SOB.  He has associated left orbital headache which is consistent with his high blood pressure and tingling in his left upper extremity.  An MRI of his brain was ordered but scheduling never reached out to him.   Past Medical History:  Diagnosis Date   History of gout    Hypertension    MI (myocardial infarction) (HCC) 2004   Sleep apnea    "don't wear mask anymore"   Stroke (HCC) 05/27/2014   "right side still weaker" (05/31/2014)    Exam BP 122/82   Pulse 65   Temp 98 F (36.7 C) (Oral)   Ht 5\' 6"  (1.676 m)   Wt 207 lb 8 oz (94.1 kg)   SpO2 97%   BMI 33.49 kg/m  General:  well developed, well nourished, in no apparent distress Heart: RRR, no bruits, no LE edema Lungs: clear to auscultation, no accessory muscle use Psych: Age appropriate judgment and insight  Essential hypertension  Chronic, not controlled.  Blood pressure on recheck is 142/78.  Number to contact to schedule MRI provided as approval has already been made.  Continue Norvasc 5 mg daily, Benicar 20 mg daily, Coreg 12.5 mg twice daily.  Add hydrochlorothiazide 25 mg daily.  Monitor blood pressure at home. F/u in 2 weeks, will also recheck BMP. The patient and his spouse voiced understanding and agreement to the plan.  [place], DO 01/28/22  4:49 PM

## 2022-01-28 NOTE — Telephone Encounter (Signed)
Called the patient and scheduled him with PCP today at 4:15.

## 2022-01-28 NOTE — Patient Instructions (Addendum)
Check your blood pressures 2-3 times per week, alternating the time of day you check it. If it is high, considering waiting 1-2 minutes and rechecking. If it gets higher, your anxiety is likely creeping up and we should avoid rechecking.   Keep the diet clean and stay active.  Go downstairs to schedule your MRI or call [phone].   Let us know if you need anything.

## 2022-02-07 ENCOUNTER — Ambulatory Visit (HOSPITAL_BASED_OUTPATIENT_CLINIC_OR_DEPARTMENT_OTHER)
Admission: RE | Admit: 2022-02-07 | Discharge: 2022-02-07 | Disposition: A | Discharge status: Home / Self Care | Payer: PRIVATE HEALTH INSURANCE | Source: Ambulatory Visit | Attending: Family Medicine | Admitting: Family Medicine

## 2022-02-07 DIAGNOSIS — R531 Weakness: Secondary | ICD-10-CM | POA: Insufficient documentation

## 2022-02-11 ENCOUNTER — Ambulatory Visit: Payer: PRIVATE HEALTH INSURANCE | Admitting: Gastroenterology

## 2022-02-11 ENCOUNTER — Ambulatory Visit (INDEPENDENT_AMBULATORY_CARE_PROVIDER_SITE_OTHER): Payer: PRIVATE HEALTH INSURANCE | Admitting: Family Medicine

## 2022-02-11 ENCOUNTER — Encounter: Payer: Self-pay | Admitting: Family Medicine

## 2022-02-11 VITALS — BP 128/82 | HR 74 | Temp 98.2°F | Ht 69.0 in | Wt 212.1 lb

## 2022-02-11 DIAGNOSIS — R519 Headache, unspecified: Secondary | ICD-10-CM

## 2022-02-11 DIAGNOSIS — I10 Essential (primary) hypertension: Secondary | ICD-10-CM

## 2022-02-11 MED ORDER — OLMESARTAN-AMLODIPINE-HCTZ 40-5-25 MG PO TABS: 1.0000 | ORAL_TABLET | Freq: Every day | ORAL | 2 refills | Status: DC

## 2022-02-11 MED ORDER — TOPIRAMATE 25 MG PO TABS: ORAL_TABLET | ORAL | 1 refills | Status: DC

## 2022-02-11 NOTE — Progress Notes (Deleted)
[name]  Chief Complaint: Chronic constipation and rectal pain  Subjective  History: I saw Brian Velez in the office April 27 for recent onset lower abdominal as well as rectal pain and bleeding with constipation.  I thought he might have a fissure, though difficult to be certain on DRE as he was quite tense.  Colonoscopy 12/08/2021 with redundant colon, small internal hemorrhoids, otherwise unrevealing but poor bowel preparation.  Recommendations were as follows: Miralax 1 capful (17 grams) in 8 ounces of water PO daily. - Anusol HC suppository twice daily x 10 days - Repeat colonoscopy in 1 year for screening purposes. (family history of colon cancer) - Ducolax/Golytely prep for next exam - Return to my office at appointment to be scheduled. - Perform a CT scan (computed tomography) of abdomen with contrast and pelvis with contrast at appointment to be scheduled.  CT scan findings as below, is not clear if the intussusceptions were incidental or causing the symptoms.  I recommended a trial of Linzess 72 mcg daily since MiraLAX was not helping much. ***  ROS: Cardiovascular:  no chest pain Respiratory: no dyspnea  The patient's Past Medical, Family and Social History were reviewed and are on file in the EMR.  Objective:  Med list reviewed  Current Outpatient Medications:    amitriptyline (ELAVIL) 50 MG tablet, TAKE 1 TABLET BY MOUTH EVERYDAY AT BEDTIME (Patient taking differently: Take 50 mg by mouth at bedtime.), Disp: 30 tablet, Rfl: 3   amLODipine (NORVASC) 5 MG tablet, TAKE 1 TABLET (5 MG TOTAL) BY MOUTH DAILY., Disp: 90 tablet, Rfl: 1   carvedilol (COREG) 12.5 MG tablet, TAKE 1 TABLET (12.5 MG TOTAL) BY MOUTH 2 (TWO) TIMES DAILY WITH A MEAL., Disp: 180 tablet, Rfl: 1   gabapentin (NEURONTIN) 300 MG capsule, TAKE 2 CAPSULES IN THE MORNING AND AFTERNOON. TAKE 3 IN THE EVENING., Disp: 210 capsule, Rfl: 5   hydrochlorothiazide (HYDRODIURIL) 25 MG tablet, Take  1 tablet (25 mg total) by mouth daily., Disp: 30 tablet, Rfl: 3   hydrocortisone (ANUSOL-HC) 25 MG suppository, Place 1 suppository (25 mg total) rectally every 12 (twelve) hours., Disp: 24 suppository, Rfl: 1   linaclotide (LINZESS) 72 MCG capsule, Take 1 capsule (72 mcg total) by mouth daily before breakfast. Lot: [id number] Exp: 12/23, Disp: 12 capsule, Rfl: 0   Multiple Vitamins-Minerals (MULTIVITAMIN WITH MINERALS) tablet, Take 1 tablet by mouth daily., Disp: , Rfl:    olmesartan (BENICAR) 20 MG tablet, Take 1 tablet (20 mg total) by mouth daily., Disp: 30 tablet, Rfl: 2   ondansetron (ZOFRAN-ODT) 4 MG disintegrating tablet, Take 1 tablet (4 mg total) by mouth every 8 (eight) hours as needed for nausea or vomiting., Disp: 20 tablet, Rfl: 0   oxybutynin (DITROPAN XL) 5 MG 24 hr tablet, Take 1 tablet (5 mg total) by mouth at bedtime., Disp: 30 tablet, Rfl: 2   pantoprazole (PROTONIX) 40 MG tablet, Take 1 tablet (40 mg total) by mouth daily., Disp: 30 tablet, Rfl: 3   traZODone (DESYREL) 100 MG tablet, TAKE 1 TABLET BY MOUTH EVERYDAY AT BEDTIME (Patient taking differently: Take 100 mg by mouth at bedtime.), Disp: 90 tablet, Rfl: 2   Vital signs in last 24 hrs: There were no vitals filed for this visit. Wt Readings from Last 3 Encounters:  01/28/22 207 lb 8 oz (94.1 kg)  01/07/22 212 lb 2 oz (96.2 kg)  12/08/21 212 lb 15.4 oz (96.6 kg)    Physical Exam  ***  HEENT: sclera anicteric, oral mucosa moist without lesions Neck: supple, no thyromegaly, JVD or lymphadenopathy Cardiac: ***,  no peripheral edema Pulm: clear to auscultation bilaterally, normal RR and effort noted Abdomen: soft, *** tenderness, with active bowel sounds. No guarding or palpable hepatosplenomegaly. Skin; warm and dry, no jaundice or rash  Labs:   ___________________________________________ Radiologic studies:  CLINICAL DATA:  Lower abdominal pain. Rectal plain. Rectal bleeding. Weight loss. Recent colonoscopy.  Family history of colon cancer.   EXAM: CT ABDOMEN AND PELVIS WITH CONTRAST   TECHNIQUE: Multidetector CT imaging of the abdomen and pelvis was performed using the standard protocol following bolus administration of intravenous contrast.   RADIATION DOSE REDUCTION: This exam was performed according to the departmental dose-optimization program which includes automated exposure control, adjustment of the mA and/or kV according to patient size and/or use of iterative reconstruction technique.   CONTRAST:  OMNIPAQUE IOHEXOL 300 MG/ML  SOLN   COMPARISON:  12/23/2016 CTA abdomen and pelvis   FINDINGS: Lower chest: Clear lung bases. Normal heart size without pericardial or pleural effusion.   Hepatobiliary: Subtle dependent gallbladder density is likely due to stones or sludge. Normal liver. No biliary duct dilatation.   Pancreas: Normal, without mass or ductal dilatation.   Spleen: Normal in size, without focal abnormality.   Adrenals/Urinary Tract: Normal adrenal glands. Interpolar left renal too small to characterize lesion. Normal right kidney. No hydronephrosis. Normal urinary bladder.   Stomach/Bowel: Surgical changes about the gastroesophageal junction. Proximal gastric underdistention. No rectal wall thickening. Normal terminal ileum and appendix.   2 small bowel intussusceptions are seen including within the left abdomen on 29/2 and 44/2. These persist on delayed images. No bowel obstruction or lead point identified.   Vascular/Lymphatic: Normal caliber of the aorta and branch vessels. No abdominopelvic adenopathy.   Reproductive: Normal prostate.   Other: Trace cul-de-sac fluid.  No free intraperitoneal air.   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 Trans pedicle screw fixation at L4-5.   IMPRESSION: 1. Two relatively short-segment small bowel intussusceptions. Although these could be incidental, given patient's  clinical history and multiplicity, lead point or lead points cannot be excluded. Consider follow-up with CT enterography or capsule endoscopy. 2. Trace cul-de-sac fluid, nonspecific but present on 12/23/2016. 3. Gallstones and/or sludge without acute cholecystitis.     Electronically Signed   By: Jeronimo Greaves M.D.   On: 01/03/2022 11:52  ____________________________________________ Other:   _____________________________________________ Assessment & Plan  Assessment: No diagnosis found.    Plan:   *** minutes were spent on this encounter (including chart review, history/exam, counseling/coordination of care, and documentation) > 50% of that time was spent on counseling and coordination of care.   Charlie Pitter III

## 2022-02-11 NOTE — Patient Instructions (Addendum)
Check your blood pressures 2-3 times per week, alternating the time of day you check it. If it is high, considering waiting 1-2 minutes and rechecking. If it gets higher, your anxiety is likely creeping up and we should avoid rechecking.   Bring your monitor to your nurse visit.   When you check your BP, make sure you have been doing something calm/relaxing 5 minutes prior to checking. Both feet should be flat on the floor and you should be sitting. Use your left arm and make sure it is in a relaxed position (on a table), and that the cuff is at the approximate level/height of your heart.  Let us know if you need anything.

## 2022-02-11 NOTE — Progress Notes (Signed)
Chief Complaint  Patient presents with   Follow-up    2 week     Subjective Brian Velez is a 57 y.o. male who presents for hypertension follow up. Here w spouse.  He does monitor home blood pressures. Blood pressures ranging from 140's/100's on average. He is compliant with medications- Norvasc 5 mg/d, HCTZ 25 mg/d, Benicar 20 mg/d, Coreg 12.5 mg bid. Patient has these side effects of medication: none He is adhering to a healthy diet overall. Current exercise: mowing lawn No CP or SOB.   Headaches Has 3-4 headaches/days per week for several months now. Neg MRI head. Quite severe/debilitating for him. Has not taken any rx medication for this. No neuro s/s's.    Past Medical History:  Diagnosis Date   History of gout    Hypertension    MI (myocardial infarction) (HCC) 2004   Sleep apnea    "don't wear mask anymore"   Stroke (HCC) 05/27/2014   "right side still weaker" (05/31/2014)    Exam BP 128/82   Pulse 74   Temp 98.2 F (36.8 C) (Oral)   Ht 5\' 9"  (1.753 m)   Wt 212 lb 2 oz (96.2 kg)   SpO2 98%   BMI 31.33 kg/m  General:  well developed, well nourished, in no apparent distress Heart: RRR, no bruits, no LE edema Lungs: clear to auscultation, no accessory muscle use Psych: well oriented with normal range of affect and appropriate judgment/insight  Essential hypertension  Chronic daily headache  Chronic, unsure if stable. Will send in olmesartan-amlodipine-hctz 40-5-25 combo to decrease pill burden, cont Coreg 12.5 mg bid. Discussed on proper way to monitor BP at home. Counseled on diet and exercise. F/u in 2 weeks for nurse BP visit, bring monitor to validate it. Chronic, unstable. Start Topamax gradually ramping up to 50 mg bid over 9 d.  F/u in 4 weeks to reck #2. The patient and his spouse voiced understanding and agreement to the plan.  04-15-1986 [place], DO 02/11/22  4:21 PM

## 2022-02-25 ENCOUNTER — Ambulatory Visit (INDEPENDENT_AMBULATORY_CARE_PROVIDER_SITE_OTHER): Payer: PRIVATE HEALTH INSURANCE | Admitting: Family Medicine

## 2022-02-25 VITALS — BP 118/80 | HR 75

## 2022-02-25 DIAGNOSIS — I10 Essential (primary) hypertension: Secondary | ICD-10-CM

## 2022-02-25 NOTE — Progress Notes (Signed)
Pt here for Blood pressure check per Sharlene Dory, DO  Pt currently takes: Olmesartan-amlodipine-HCTZ 40-5-25, Coreg 12.5 mg Pt reports compliance with medication.   BP Readings from Last 3 Encounters:  02/11/22 128/82  01/28/22 122/82  01/07/22 (!) 142/94     BP today: 108/80 L arm, 110/80 R arm. After sitting for 5 minutes L arm 118/80, R arm 118/80  Pt complains of dizziness and headache. Pt is currently fasted, has only consumed coffee today.   HR: 75   Pt advised to continue current medications and eat/drink on a regular basis.

## 2022-03-30 ENCOUNTER — Telehealth: Payer: Self-pay

## 2022-03-30 NOTE — Telephone Encounter (Signed)
Hold the Coreg, I'd like to see him tomorrow. Ty.

## 2022-03-30 NOTE — Telephone Encounter (Signed)
Pt's wife called in states that pt has been having bp issues. Bp has been 88/54. Pt has been having nausea but Saturday he experienced vomiting. Pt has falling 3 times yesterday and twice today.

## 2022-03-30 NOTE — Telephone Encounter (Signed)
Patient called back and informed/scheduled appt.

## 2022-03-30 NOTE — Telephone Encounter (Signed)
Called home number/and number listed in chart/left message on one number the other number no answer/no VM set up.

## 2022-03-31 ENCOUNTER — Encounter: Payer: Self-pay | Admitting: Family Medicine

## 2022-03-31 ENCOUNTER — Ambulatory Visit (INDEPENDENT_AMBULATORY_CARE_PROVIDER_SITE_OTHER): Payer: PRIVATE HEALTH INSURANCE | Admitting: Family Medicine

## 2022-03-31 VITALS — BP 92/60 | HR 78 | Temp 98.2°F | Ht 70.0 in | Wt 208.2 lb

## 2022-03-31 DIAGNOSIS — I10 Essential (primary) hypertension: Secondary | ICD-10-CM

## 2022-03-31 NOTE — Patient Instructions (Addendum)
Hold the blood pressure medication today and tomorrow (04/01/22).   8/24 (Thurs) we will start back on the olmesartan 20 mg tab daily. Send me a message Sun/Monday with our readings.  Keep the diet clean and stay active.  Try to drink 55-60 oz of water/fluids daily outside of exercise.  Let us know if you need anything.

## 2022-03-31 NOTE — Progress Notes (Signed)
Chief Complaint  Patient presents with   Follow-up    Blood pressure     Subjective Brian Velez is a 56 y.o. male who presents for hypertension follow up. Here w wife.  He does monitor home blood pressures. Blood pressures ranging from 80-90's/50-60's on average. He is compliant with medications- olmesartan-amlodipine-HCTZ 40-5-25 mg/d. Patient has these side effects of medication: lightheadedness, falls He is adhering to a healthy diet overall. Current exercise: active in yard No Cp or SOB.   Past Medical History:  Diagnosis Date   History of gout    Hypertension    MI (myocardial infarction) (HCC) 2004   Sleep apnea    "don't wear mask anymore"   Stroke (HCC) 05/27/2014   "right side still weaker" (05/31/2014)    Exam BP 92/60   Pulse 78   Temp 98.2 F (36.8 C) (Oral)   Ht 5\' 10"  (1.778 m)   Wt 208 lb 4 oz (94.5 kg)   SpO2 93%   BMI 29.88 kg/m  General:  well developed, well nourished, in no apparent distress Heart: RRR, no bruits, no LE edema Lungs: clear to auscultation, no accessory muscle use Psych: well oriented with normal range of affect and appropriate judgment/insight  Essential hypertension  Chronic, uncontrolled. AE from meds. Counseled on diet and exercise. Hold BP med for today and tomorrow. Then add back olmesartan 20 mg/d. Send message after 3-4 days back on it, may add back HCTZ 25 mg/d at that point. If still not fully controlled, will go back on olmesartan -amlodipine-HCTZ 40-5-25 mg/d. Monitor BP at home.  F/u prn for now. The patient and his spouse voiced understanding and agreement to the plan.  04-15-1987 [place], DO 03/31/22  2:23 PM

## 2022-04-04 ENCOUNTER — Encounter: Payer: Self-pay | Admitting: Family Medicine

## 2022-04-06 ENCOUNTER — Other Ambulatory Visit: Payer: Self-pay | Admitting: Family Medicine

## 2022-04-06 MED ORDER — OLMESARTAN MEDOXOMIL 20 MG PO TABS
20.0000 mg | ORAL_TABLET | Freq: Every day | ORAL | Status: DC
Start: 2022-04-06 — End: 2022-07-04

## 2022-04-08 ENCOUNTER — Other Ambulatory Visit: Payer: Self-pay | Admitting: Family Medicine

## 2022-04-08 DIAGNOSIS — K921 Melena: Secondary | ICD-10-CM

## 2022-04-16 IMAGING — MR MR LUMBAR SPINE W/O CM
4 of 5 series · 26 of 48 positions shown · non-contrast
Comparison: Radiographs of the lumbar spine 05/27/2020.

CLINICAL DATA: Lumbar radiculopathy. Low back pain, greater than 6
weeks. Additional history provided by scanning technologist: Left
leg pain for 1 month.

EXAM:
MRI LUMBAR SPINE WITHOUT CONTRAST
TECHNIQUE: Multiplanar, multisequence MR imaging of the lumbar spine was
performed. No intravenous contrast was administered.

[Series 3: T2 · axial · 4.0mm · 0.78mm/px · z∈[-97,+109]mm · 10 of 40 slices shown (1 of 2)]
[im 3/40]
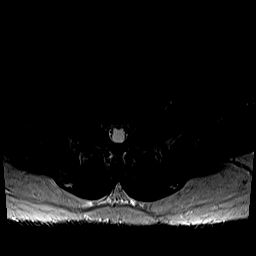
[im 6/40]
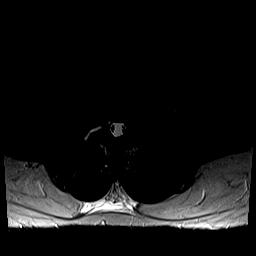
[im 8/40]
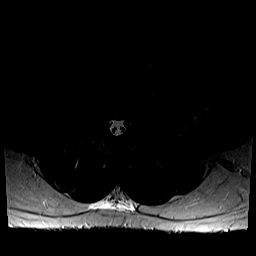
[im 14/40]
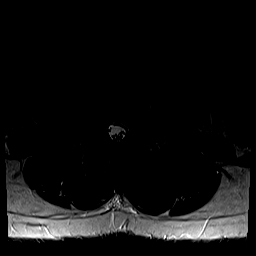
[im 19/40]
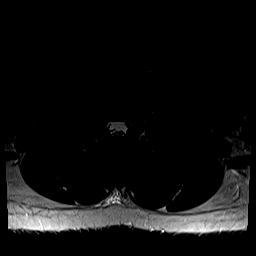
[im 21/40]
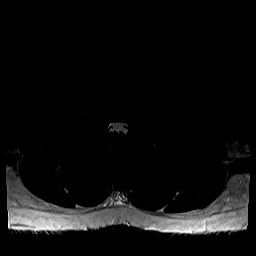
[im 24/40]
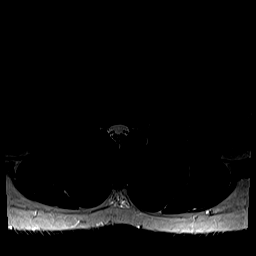
[im 29/40]
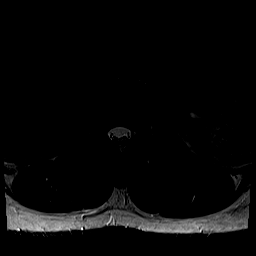
[im 34/40]
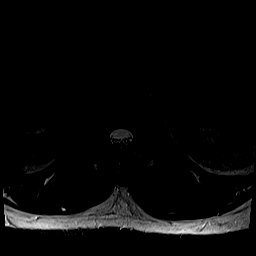
[im 40/40]
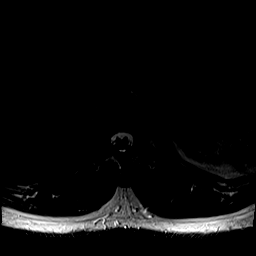

[Series 4: T1 · axial · 4.0mm · 0.39mm/px · z∈[-107,+79]mm · 7 of 40 slices shown (1 of 2)]
[im 1/40]
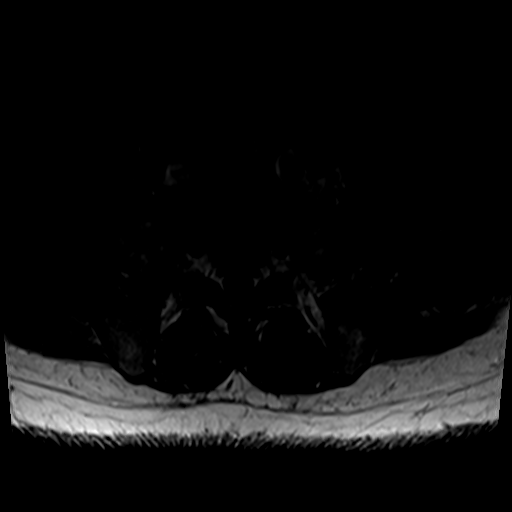
[im 6/40]
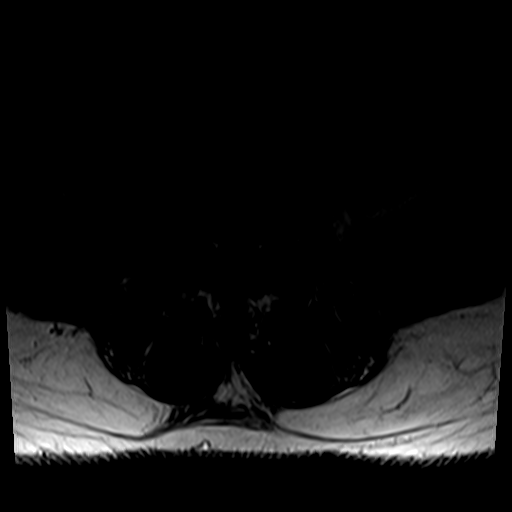
[im 12/40]
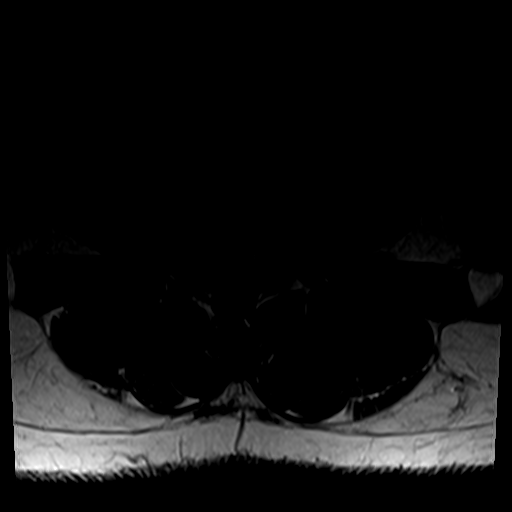
[im 17/40]
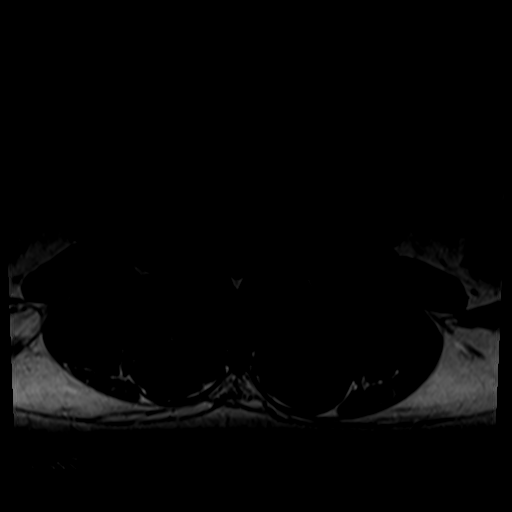
[im 20/40]
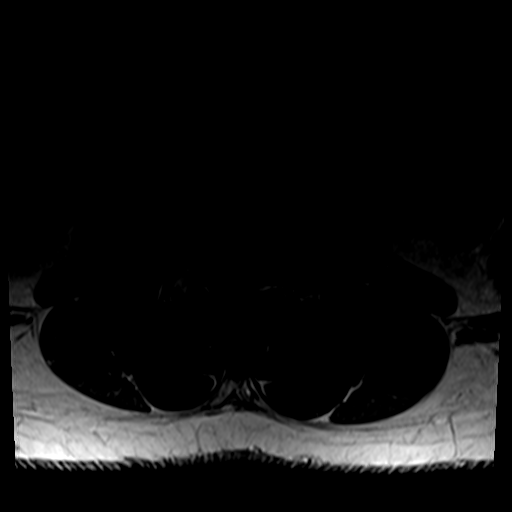
[im 23/40]
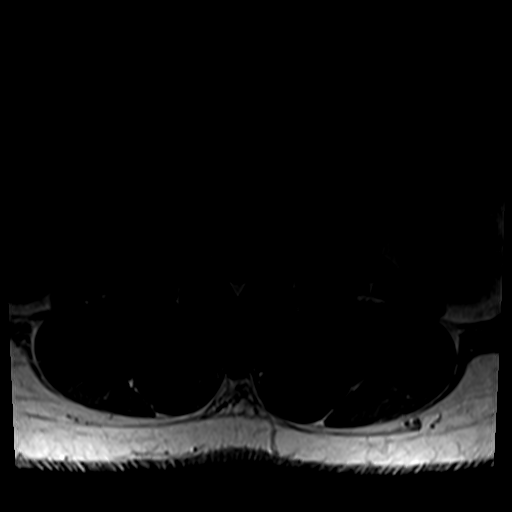
[im 34/40]
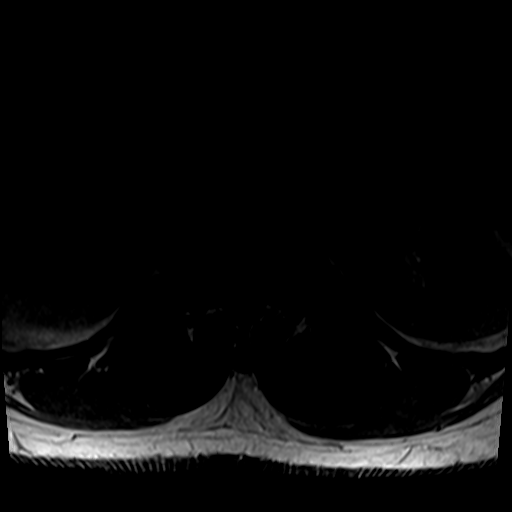

[Series 5: T2 · sagittal · 4.0mm · 0.81mm/px · 6 of 15 slices shown (2 of 2)]
[im 1/15]
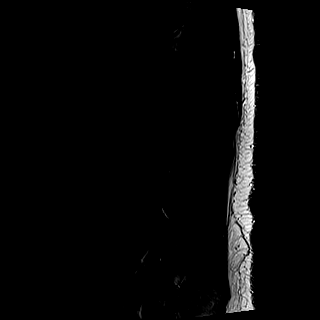
[im 3/15]
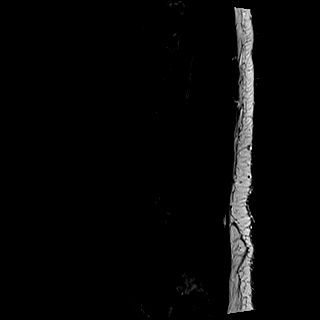
[im 6/15]
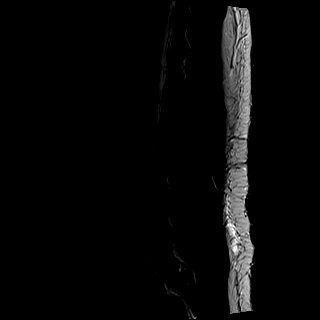
[im 9/15]
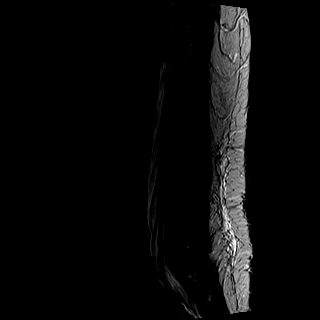
[im 12/15]
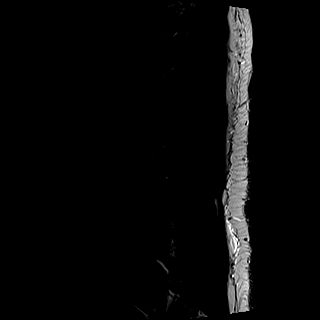
[im 15/15]
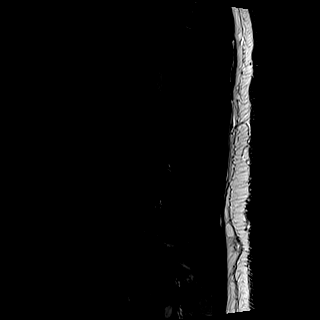

[Series 6: T1 · sagittal · 4.0mm · 0.41mm/px · 3 of 15 slices shown (2 of 2)]
[im 3/15]
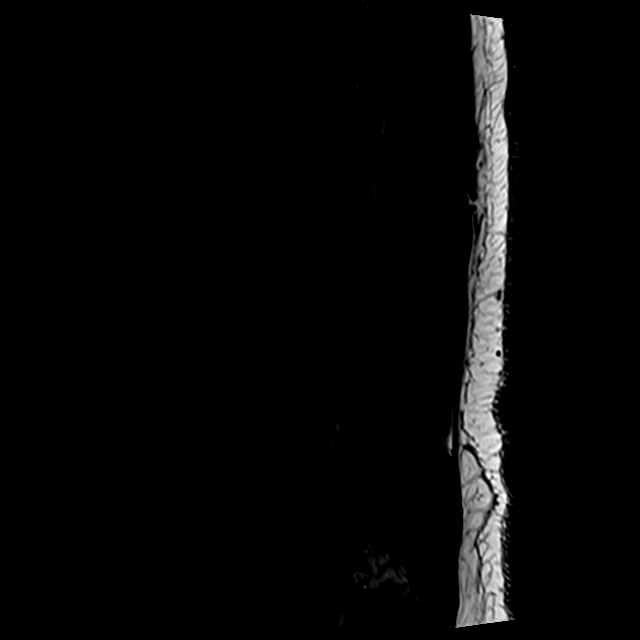
[im 9/15]
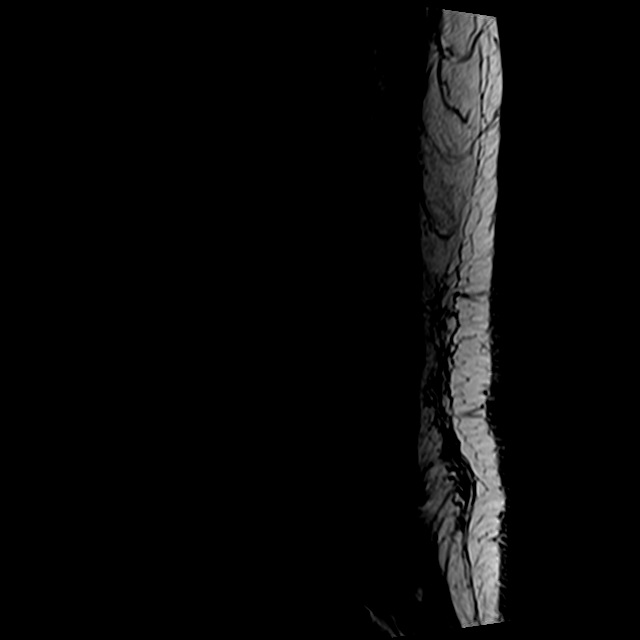
[im 15/15]
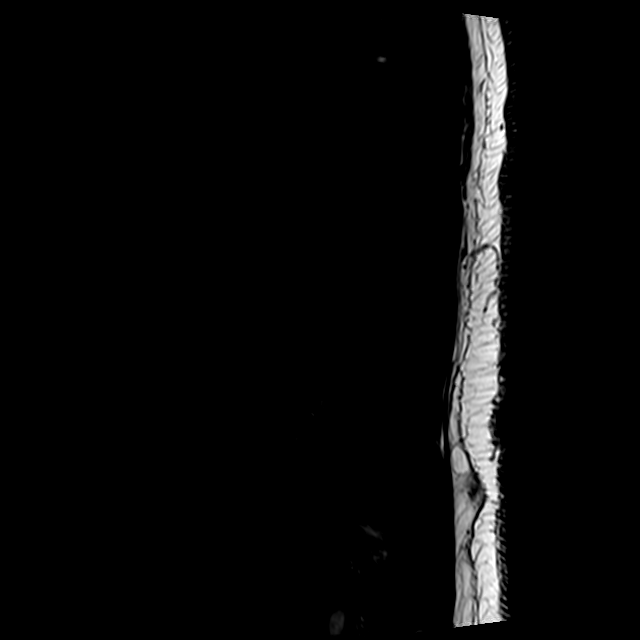

[26 of 48 positions shown; findings below may reference images not displayed]

FINDINGS: Segmentation: 5 lumbar vertebrae (correlating with prior lumbar
spine radiographs 05/27/2020). The caudal most well-formed
intervertebral disc space is designated L5-S1.

Alignment: Straightening of the expected lumbar lordosis. No
significant spondylolisthesis.

Vertebrae: Vertebral body height is maintained. No focal suspicious
osseous lesion or significant marrow edema. Hemangioma within the
right aspect of the L2 vertebra.

Conus medullaris and cauda equina: Conus extends to the L1 level. No
signal abnormality within the visualized distal spinal cord.

Paraspinal and other soft tissues: Incompletely assessed left renal
cyst. Paraspinal soft tissues unremarkable.

Disc levels:

Mild multilevel disc degeneration greatest at L4-L5.

T12-L1: No significant disc herniation or stenosis.

L1-L2: Mild facet arthrosis. No significant disc herniation or
stenosis.

L2-L3: No significant disc herniation or stenosis.

L3-L4: Disc bulge asymmetric to the right. Superimposed right
foraminal disc protrusion. Mild facet hypertrophy. No significant
spinal canal stenosis. Moderate bilateral neural foraminal narrowing
(greater on the right).

L4-L5: Disc bulge with mild endplate spurring. Mild facet
arthrosis/ligamentum flavum hypertrophy. Mild/moderate bilateral
subarticular narrowing with slight crowding of the descending L5
nerve roots (series 3, image 30). Mild relative narrowing of the
central canal. Severe bilateral neural foraminal narrowing with
potential to affect either exiting L4 nerve root.

L5-S1: Disc bulge with minimal endplate spurring. Moderate facet
arthrosis with mild ligamentum flavum hypertrophy. Small right facet
joint effusion. Mild right subarticular narrowing without frank
nerve root impingement. Central canal patent. Moderate bilateral
neural foraminal narrowing.
IMPRESSION: Lumbar spondylosis as outlined with findings most notably as
follows.

At L4-L5, there is mild disc degeneration. Disc bulge with mild
endplate spurring. Mild facet arthrosis/ligamentum flavum
hypertrophy. Mild/moderate bilateral subarticular narrowing with
slight crowding of the descending L5 nerve roots. Mild relative
narrowing of the central canal. Severe bilateral neural foraminal
narrowing with potential to affect either exiting L4 nerve root.

At L3-L4, there is mild disc degeneration. Disc bulge asymmetric to
the right. Superimposed right foraminal disc protrusion. Mild facet
hypertrophy. No significant spinal canal stenosis. Moderate
bilateral neural foraminal narrowing (greater on the right).

At L5-S1, there is mild disc degeneration. Disc bulge with minimal
endplate spurring. Moderate facet arthrosis with mild ligamentum
flavum hypertrophy. Small right facet joint effusion. Mild right
subarticular narrowing without frank nerve root impingement.
Moderate bilateral neural foraminal narrowing.

## 2022-04-17 ENCOUNTER — Ambulatory Visit (INDEPENDENT_AMBULATORY_CARE_PROVIDER_SITE_OTHER): Payer: PRIVATE HEALTH INSURANCE | Admitting: Family Medicine

## 2022-04-17 ENCOUNTER — Encounter: Payer: Self-pay | Admitting: Family Medicine

## 2022-04-17 VITALS — BP 128/86 | HR 80 | Temp 98.2°F | Ht 71.0 in | Wt 209.4 lb

## 2022-04-17 DIAGNOSIS — G44009 Cluster headache syndrome, unspecified, not intractable: Secondary | ICD-10-CM | POA: Diagnosis not present

## 2022-04-17 DIAGNOSIS — I10 Essential (primary) hypertension: Secondary | ICD-10-CM

## 2022-04-17 MED ORDER — SUMATRIPTAN SUCCINATE 100 MG PO TABS: 100.0000 mg | ORAL_TABLET | ORAL | 0 refills | Status: DC | PRN

## 2022-04-17 NOTE — Patient Instructions (Signed)
We may need to go on a daily medicine for the headaches.  If nothing is helping again, come in to the clinic for oxygen therapy.  Keep the diet clean and stay active.  No changes with your blood pressure medication.   Let us know if you need anything.

## 2022-04-17 NOTE — Progress Notes (Signed)
Chief Complaint  Patient presents with   Follow-up     Blood pressure Headache  Dizziness Blurred vision Nothing is helping with his symptoms    Subjective: Patient is a 56 y.o. male here for f/u.  Patient has not been feeling well for the past couple months.  He has been having left-sided headache, blurry vision, and dizziness.  He had been attributing it to his high blood pressure but that has been controlled lately.  He is currently on hydrochlorothiazide 25 mg daily and Benicar 20 mg daily and reports compliance.  Diet has been healthy overall and he has given his job.  He denies any new balance issues or weakness anywhere.  He has tried various headache medication that has not helped.  He had an MRI done in early July that was unremarkable.  Past Medical History:  Diagnosis Date   History of gout    Hypertension    MI (myocardial infarction) (HCC) 2004   Sleep apnea    "don't wear mask anymore"   Stroke (HCC) 05/27/2014   "right side still weaker" (05/31/2014)    Objective: BP 128/86   Pulse 80   Temp 98.2 F (36.8 C) (Oral)   Ht 5\' 11"  (1.803 m)   Wt 209 lb 6 oz (95 kg)   SpO2 98%   BMI 29.20 kg/m  General: Awake, appears stated age Heart: RRR, no LE edema Lungs: CTAB, no rales, wheezes or rhonchi. No accessory muscle use Neuro: DTRs equal and symmetric throughout, no clonus, no cerebellar signs, 5/5 strength throughout MSK: Mild TTP over the cervical paraspinal musculature bilaterally Psych: Age appropriate judgment and insight, normal affect and mood  Assessment and Plan: Essential hypertension  Cluster headache, not intractable, unspecified chronicity pattern - Plan: SUMAtriptan (IMITREX) 100 MG tablet  This is controlled.  Continue hydrochlorothiazide 25 mg daily, Benicar 20 mg daily.  Counseled on diet and exercise. New problem, not controlled.  Start Imitrex 100 mg as needed.  We put him on some oxygen while he was in the office and he reported near  immediate improvement.  I offered a daily medication but he would prefer to hold off on this for now.  We will start him on verapamil if he does change his mind or find he is having frequent recurrence. The patient voiced understanding and agreement to the plan.  [place], DO 04/17/22  12:30 PM

## 2022-04-20 ENCOUNTER — Other Ambulatory Visit: Payer: Self-pay | Admitting: Family Medicine

## 2022-04-20 MED ORDER — VERAPAMIL HCL ER 120 MG PO TBCR: 120.0000 mg | EXTENDED_RELEASE_TABLET | Freq: Two times a day (BID) | ORAL | 1 refills | Status: DC

## 2022-04-20 MED ORDER — HYDROCHLOROTHIAZIDE 25 MG PO TABS: 25.0000 mg | ORAL_TABLET | Freq: Every day | ORAL | 3 refills | Status: DC

## 2022-05-16 ENCOUNTER — Other Ambulatory Visit: Payer: Self-pay | Admitting: Family Medicine

## 2022-05-16 DIAGNOSIS — G44009 Cluster headache syndrome, unspecified, not intractable: Secondary | ICD-10-CM

## 2022-05-20 ENCOUNTER — Other Ambulatory Visit: Payer: Self-pay | Admitting: Family Medicine

## 2022-05-21 ENCOUNTER — Other Ambulatory Visit: Payer: Self-pay | Admitting: Family Medicine

## 2022-05-21 DIAGNOSIS — M5416 Radiculopathy, lumbar region: Secondary | ICD-10-CM

## 2022-07-03 ENCOUNTER — Other Ambulatory Visit: Payer: Self-pay | Admitting: Family Medicine

## 2022-07-08 ENCOUNTER — Encounter: Payer: Self-pay | Admitting: Family Medicine

## 2022-07-08 ENCOUNTER — Other Ambulatory Visit: Payer: PRIVATE HEALTH INSURANCE

## 2022-07-08 ENCOUNTER — Telehealth: Payer: Self-pay

## 2022-07-08 ENCOUNTER — Ambulatory Visit (INDEPENDENT_AMBULATORY_CARE_PROVIDER_SITE_OTHER): Payer: Self-pay | Admitting: Family Medicine

## 2022-07-08 ENCOUNTER — Other Ambulatory Visit: Payer: Self-pay | Admitting: Family Medicine

## 2022-07-08 VITALS — BP 136/86 | HR 97 | Temp 98.3°F | Ht 69.0 in | Wt 207.5 lb

## 2022-07-08 DIAGNOSIS — G4700 Insomnia, unspecified: Secondary | ICD-10-CM

## 2022-07-08 DIAGNOSIS — K625 Hemorrhage of anus and rectum: Secondary | ICD-10-CM

## 2022-07-08 DIAGNOSIS — R7989 Other specified abnormal findings of blood chemistry: Secondary | ICD-10-CM

## 2022-07-08 DIAGNOSIS — R413 Other amnesia: Secondary | ICD-10-CM

## 2022-07-08 DIAGNOSIS — Z23 Encounter for immunization: Secondary | ICD-10-CM

## 2022-07-08 LAB — COMPREHENSIVE METABOLIC PANEL
ALT: 11 U/L (ref 0–53)
AST: 18 U/L (ref 0–37)
Albumin: 4.1 g/dL (ref 3.5–5.2)
Alkaline Phosphatase: 89 U/L (ref 39–117)
BUN: 13 mg/dL (ref 6–23)
CO2: 30 mEq/L (ref 19–32)
Calcium: 8.8 mg/dL (ref 8.4–10.5)
Chloride: 106 mEq/L (ref 96–112)
Creatinine, Ser: 1 mg/dL (ref 0.40–1.50)
GFR: 84.6 mL/min (ref >60.00)
Glucose, Bld: 57 mg/dL — ABNORMAL LOW (ref 70–99)
Potassium: 3.8 mEq/L (ref 3.5–5.1)
Sodium: 142 mEq/L (ref 135–145)
Total Bilirubin: 0.9 mg/dL (ref 0.2–1.2)
Total Protein: 6.9 g/dL (ref 6.0–8.3)

## 2022-07-08 LAB — TSH: TSH: 1.14 u[IU]/mL (ref 0.35–5.50)

## 2022-07-08 LAB — CBC
HCT: 39.7 % (ref 39.0–52.0)
Hemoglobin: 13.6 g/dL (ref 13.0–17.0)
MCHC: 34.3 g/dL (ref 30.0–36.0)
MCV: 86.4 fl (ref 78.0–100.0)
Platelets: 315 10*3/uL (ref 150.0–400.0)
RBC: 4.6 Mil/uL (ref 4.22–5.81)
RDW: 14.7 % (ref 11.5–15.5)
WBC: 6.7 10*3/uL (ref 4.0–10.5)

## 2022-07-08 LAB — VITAMIN B12: Vitamin B-12: 206 pg/mL — ABNORMAL LOW (ref 211–911)

## 2022-07-08 MED ORDER — HYDROCHLOROTHIAZIDE 25 MG PO TABS: 25.0000 mg | ORAL_TABLET | Freq: Every day | ORAL | 3 refills | Status: DC

## 2022-07-08 MED ORDER — AMITRIPTYLINE HCL 100 MG PO TABS: 100.0000 mg | ORAL_TABLET | Freq: Every evening | ORAL | 3 refills | Status: DC | PRN

## 2022-07-08 NOTE — Patient Instructions (Addendum)
Give [place] 2-3 business days to get the results of your labs back.   Stay active.   Stay mindful with activities that make you think; puzzles, games, reading, etc.  If you do not hear anything about your referral in the next 1-2 weeks, call our office and ask for an update.  If you don't hear anything from me or Dr. Myrtie Neither' office in the next couple days, please send a message.   Let us know if you need anything.

## 2022-07-08 NOTE — Progress Notes (Signed)
Chief Complaint  Patient presents with   foggy memory. Has gotten lost several times    Constant Rectal bleeding    Subjective: Patient is a 56 y.o. male here for f/u.  Over the past several months, the patient has been having memory issues.  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  The other day, he was getting gas and forgot how to get home even though he was just off the road.  He saw a neighbor and did not recognize him and asked him who he was.  He called his wife and was able to get home.  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  His mother started having signs of dementia at age 32 and passed away in her early 80s.  Stress levels are high in addition to him getting poor sleep.  He takes 50 mg of amitriptyline nightly which was helpful in the past but not so much lately.  He will sometimes take 2 tabs which does help.  Past Medical History:  Diagnosis Date   History of gout    Hypertension    MI (myocardial infarction) (HCC) 2004   Sleep apnea    "don't wear mask anymore"   Stroke (HCC) 05/27/2014   "right side still weaker" (05/31/2014)    Objective: BP 136/86 (BP Location: Left Arm, Cuff Size: Normal)   Pulse 97   Temp 98.3 F (36.8 C) (Oral)   Ht 5\' 9"  (1.753 m)   Wt 207 lb 8 oz (94.1 kg)   SpO2 98%   BMI 30.64 kg/m  General: Awake, appears stated age Heart: RRR, no LE edema Lungs: CTAB, no rales, wheezes or rhonchi. No accessory muscle use Neuro: Gait nml, DTR's 0/4 in biceps, 1/4 patellar b/l, no clonus, no cerebellar signs, A&O x 3, did not know president, took a while to answer date Psych: Age appropriate judgment and insight, normal affect and mood  Assessment and Plan: Insomnia, unspecified type - Plan: amitriptyline (ELAVIL) 100 MG tablet  BRBPR (bright red blood per rectum)  Memory change - Plan: CBC, Comprehensive metabolic panel, TSH, B12, Ambulatory referral to Neurology  Chronic, uncontrolled.  Increase amitriptyline  from 50 mg nightly to 100 mg nightly.  Follow-up in 1 month recheck this. He showed me a picture of blood in his toilet.  I will reach out to Dr. and his team to see if they can contact him to schedule treatment for this. This is somewhat concerning given his family history.  MRI from the summer does show some possible degenerative changes.  Will check metabolic causes and start the referral process to the neuropsychology team.  It could be due to insomnia/stress as well. The patient voiced understanding and agreement to the plan.  Myrtie Neither [place], DO 07/08/22  10:01 AM

## 2022-07-08 NOTE — Addendum Note (Signed)
Addended by: Scharlene Gloss B on: 07/08/2022 10:19 AM   Modules accepted: Orders

## 2022-07-08 NOTE — Telephone Encounter (Signed)
-----   Message from Charlie Pitter III, MD sent at 07/08/2022 11:16 AM EST ----- Regarding: RE: Hemorrhoid banding? Sure thing, Brian Velez.  Appreciate the note. ____________  Sharol Harness,  Please arrange an office visit for hemorrhoids.  It has been over 6 months since I saw him, so I will reevaluate his condition.  We might also do the banding that day if time allows.  Actually, please give him a second appointment 2 to 3 weeks after the first because even if I do the banding on the first occasion, he will need second session shortly afterward.  HD  ----- Message ----- From: Sharlene Dory, DO Sent: 07/08/2022  10:08 AM EST To: Sherrilyn Rist, MD Subject: Hemorrhoid banding?                            Dr. Myrtie Neither- Brian Velez is a mutual patient of ours who you helped take care of earlier in the yr for BRBPR. You found some internal bleeding hemorrhoids, and he reports that internal banding was suggested with your team but they have been having trouble trying to schedule. Would it be possible to have someone from your team reach out to them to schedule something with you?  He is still having the issue and would like it taken care of. Thanks so much for your help. Brian Velez

## 2022-07-09 NOTE — Telephone Encounter (Signed)
Lm on vm for patient to return call to schedule follow up appts with Dr. Myrtie Neither as outlined below.

## 2022-07-09 NOTE — Telephone Encounter (Signed)
Noted, thanks!

## 2022-07-09 NOTE — Telephone Encounter (Signed)
Inbound call from patient returning call. Patient scheduled.

## 2022-07-10 LAB — INTRINSIC FACTOR ANTIBODIES: Intrinsic Factor: NEGATIVE

## 2022-07-13 ENCOUNTER — Encounter: Payer: Self-pay | Admitting: Family Medicine

## 2022-07-13 ENCOUNTER — Encounter: Payer: Self-pay | Admitting: Gastroenterology

## 2022-07-13 ENCOUNTER — Ambulatory Visit: Payer: PRIVATE HEALTH INSURANCE | Admitting: Gastroenterology

## 2022-07-13 NOTE — Progress Notes (Deleted)
[facility] Progress Note  Chief Complaint: Rectal pain and bleeding  Subjective  History: Brian Velez was seen in May of this year for constipation with rectal pain and bleeding.  Colonoscopy had a poor bowel preparation, plans to recall in 1 year with more extensive prep for better screening due to family history of colon cancer.  Internal hemorrhoids were seen, banding suggested. He is here today for ongoing rectal pain and bleeding that he discussed with primary care at a recent visit. ***  ROS: Cardiovascular:  no chest pain Respiratory: no dyspnea  The patient's Past Medical, Family and Social History were reviewed and are on file in the EMR.  Objective:  Med list reviewed  Current Outpatient Medications:    amitriptyline (ELAVIL) 100 MG tablet, Take 1 tablet (100 mg total) by mouth at bedtime as needed for sleep., Disp: 30 tablet, Rfl: 3   gabapentin (NEURONTIN) 300 MG capsule, TAKE 2 CAPSULES IN THE MORNING AND AFTERNOON. TAKE 3 IN THE EVENING., Disp: 210 capsule, Rfl: 5   hydrochlorothiazide (HYDRODIURIL) 25 MG tablet, Take 1 tablet (25 mg total) by mouth daily., Disp: 90 tablet, Rfl: 3   linaclotide (LINZESS) 72 MCG capsule, Take 1 capsule (72 mcg total) by mouth daily before breakfast. Lot: [id number] Exp: 12/23, Disp: 12 capsule, Rfl: 0   Multiple Vitamins-Minerals (MULTIVITAMIN WITH MINERALS) tablet, Take 1 tablet by mouth daily., Disp: , Rfl:    olmesartan (BENICAR) 20 MG tablet, TAKE 1 TABLET BY MOUTH EVERY DAY, Disp: 30 tablet, Rfl: 2   ondansetron (ZOFRAN-ODT) 4 MG disintegrating tablet, Take 1 tablet (4 mg total) by mouth every 8 (eight) hours as needed for nausea or vomiting., Disp: 20 tablet, Rfl: 0   oxybutynin (DITROPAN XL) 5 MG 24 hr tablet, Take 1 tablet (5 mg total) by mouth at bedtime., Disp: 30 tablet, Rfl: 2   pantoprazole (PROTONIX) 40 MG tablet, TAKE 1 TABLET BY MOUTH EVERY DAY, Disp: 30 tablet, Rfl: 3   SUMAtriptan (IMITREX) 100 MG tablet, TAKE 1 TABLET  (100 MG TOTAL) BY MOUTH EVERY 2 (TWO) HOURS AS NEEDED FOR MIGRAINE. MAY REPEAT IN 2 HOURS IF HEADACHE PERSISTS OR RECURS., Disp: 10 tablet, Rfl: 0   topiramate (TOPAMAX) 25 MG tablet, Take 1 tab/dayfor 3 days then 1 tab 2x/day for 3 days. Add a pill in AM and then PM over 3 day intervals until taking 2 pills 2x/day., Disp: 120 tablet, Rfl: 1   verapamil (CALAN-SR) 120 MG CR tablet, Take 1 tablet (120 mg total) by mouth 2 (two) times daily., Disp: 60 tablet, Rfl: 1   Vital signs in last 24 hrs: There were no vitals filed for this visit. Wt Readings from Last 3 Encounters:  07/08/22 207 lb 8 oz (94.1 kg)  04/17/22 209 lb 6 oz (95 kg)  03/31/22 208 lb 4 oz (94.5 kg)    Physical Exam  *** HEENT: sclera anicteric, oral mucosa moist without lesions Neck: supple, no thyromegaly, JVD or lymphadenopathy Cardiac: ***,  no peripheral edema Pulm: clear to auscultation bilaterally, normal RR and effort noted Abdomen: soft, *** tenderness, with active bowel sounds. No guarding or palpable hepatosplenomegaly. Skin; warm and dry, no jaundice or rash  Labs:   ___________________________________________ Radiologic studies:   ____________________________________________ Other:   _____________________________________________ Assessment & Plan  Assessment: No diagnosis found.    Plan:   *** minutes were spent on this encounter (including chart review, history/exam, counseling/coordination of care, and documentation) > 50% of that time was spent on  counseling and coordination of care.   Brian Velez

## 2022-07-20 NOTE — Progress Notes (Unsigned)
GI Progress Note  Chief Complaint: Symptomatic internal hemorrhoids  Subjective  History: Seen in office May this year for constipation, rectal pain and bleeding - colonoscopy poor prep, internal HR. CTAP soon after showed two small intussusceptions of questionable relation to symptoms.  Was late to a recent visit that was set up at PCP request - patient had said he experienced difficulty contacting out office prior to that.  Was rescheduled to today.  He is here with his wife today complaining of rectal pain and pressure and bleeding as well as ongoing constipation.  He is quite troubled by this bleeding and it is creating significant anxiety.  He has not been taking MiraLAX or other laxatives at this point.  ROS: Cardiovascular:  no chest pain Respiratory: no dyspnea  The patient's Past Medical, Family and Social History were reviewed and are on file in the EMR.  Objective:  Med list reviewed  Current Outpatient Medications:    amitriptyline (ELAVIL) 100 MG tablet, Take 1 tablet (100 mg total) by mouth at bedtime as needed for sleep., Disp: 30 tablet, Rfl: 3   gabapentin (NEURONTIN) 300 MG capsule, TAKE 2 CAPSULES IN THE MORNING AND AFTERNOON. TAKE 3 IN THE EVENING., Disp: 210 capsule, Rfl: 5   hydrochlorothiazide (HYDRODIURIL) 25 MG tablet, Take 1 tablet (25 mg total) by mouth daily., Disp: 90 tablet, Rfl: 3   linaclotide (LINZESS) 72 MCG capsule, Take 1 capsule (72 mcg total) by mouth daily before breakfast. Lot: [id number] Exp: 12/23, Disp: 12 capsule, Rfl: 0   Multiple Vitamins-Minerals (MULTIVITAMIN WITH MINERALS) tablet, Take 1 tablet by mouth daily., Disp: , Rfl:    olmesartan (BENICAR) 20 MG tablet, TAKE 1 TABLET BY MOUTH EVERY DAY, Disp: 30 tablet, Rfl: 2   ondansetron (ZOFRAN-ODT) 4 MG disintegrating tablet, Take 1 tablet (4 mg total) by mouth every 8 (eight) hours as needed for nausea or vomiting., Disp: 20 tablet, Rfl: 0   oxybutynin (DITROPAN XL) 5 MG 24  hr tablet, Take 1 tablet (5 mg total) by mouth at bedtime., Disp: 30 tablet, Rfl: 2   pantoprazole (PROTONIX) 40 MG tablet, TAKE 1 TABLET BY MOUTH EVERY DAY, Disp: 30 tablet, Rfl: 3   SUMAtriptan (IMITREX) 100 MG tablet, TAKE 1 TABLET (100 MG TOTAL) BY MOUTH EVERY 2 (TWO) HOURS AS NEEDED FOR MIGRAINE. MAY REPEAT IN 2 HOURS IF HEADACHE PERSISTS OR RECURS., Disp: 10 tablet, Rfl: 0   topiramate (TOPAMAX) 25 MG tablet, Take 1 tab/dayfor 3 days then 1 tab 2x/day for 3 days. Add a pill in AM and then PM over 3 day intervals until taking 2 pills 2x/day., Disp: 120 tablet, Rfl: 1   verapamil (CALAN-SR) 120 MG CR tablet, Take 1 tablet (120 mg total) by mouth 2 (two) times daily., Disp: 60 tablet, Rfl: 1   Vital signs in last 24 hrs: Vitals:   07/21/22 1104  BP: 100/60  Pulse: 87   Wt Readings from Last 3 Encounters:  07/21/22 208 lb (94.3 kg)  07/08/22 207 lb 8 oz (94.1 kg)  04/17/22 209 lb 6 oz (95 kg)    Physical Exam  Visibly uncomfortable and anxious  Abdomen: soft, no tenderness, with active bowel sounds. No guarding or palpable hepatosplenomegaly. Perianal exam normal.  DRE normal,-sphincter tone, no fissure or palpable internal lesion. Anoscopy with swollen internal hemorrhoids, RP and RA greater than LL columns  Labs:     Latest Ref Rng & Units 07/08/2022   10:08 AM 12/01/2021   12:25  PM 06/19/2021    9:40 AM  CBC  WBC 4.0 - 10.5 K/uL 6.7  6.3  7.5   Hemoglobin 13.0 - 17.0 g/dL 76.5  46.5  03.5   Hematocrit 39.0 - 52.0 % 39.7  36.6  39.9   Platelets 150.0 - 400.0 K/uL 315.0  272.0  329     ___________________________________________ Radiologic studies:  CLINICAL DATA:  Lower abdominal pain. Rectal plain. Rectal bleeding. Weight loss. Recent colonoscopy. Family history of colon cancer.   EXAM: CT ABDOMEN AND PELVIS WITH CONTRAST   TECHNIQUE: Multidetector CT imaging of the abdomen and pelvis was performed using the standard protocol following bolus administration  of intravenous contrast.   RADIATION DOSE REDUCTION: This exam was performed according to the departmental dose-optimization program which includes automated exposure control, adjustment of the mA and/or kV according to patient size and/or use of iterative reconstruction technique.   CONTRAST:  OMNIPAQUE IOHEXOL 300 MG/ML  SOLN   COMPARISON:  12/23/2016 CTA abdomen and pelvis   FINDINGS: Lower chest: Clear lung bases. Normal heart size without pericardial or pleural effusion.   Hepatobiliary: Subtle dependent gallbladder density is likely due to stones or sludge. Normal liver. No biliary duct dilatation.   Pancreas: Normal, without mass or ductal dilatation.   Spleen: Normal in size, without focal abnormality.   Adrenals/Urinary Tract: Normal adrenal glands. Interpolar left renal too small to characterize lesion. Normal right kidney. No hydronephrosis. Normal urinary bladder.   Stomach/Bowel: Surgical changes about the gastroesophageal junction. Proximal gastric underdistention. No rectal wall thickening. Normal terminal ileum and appendix.   2 small bowel intussusceptions are seen including within the left abdomen on 29/2 and 44/2. These persist on delayed images. No bowel obstruction or lead point identified.   Vascular/Lymphatic: Normal caliber of the aorta and branch vessels. No abdominopelvic adenopathy.   Reproductive: Normal prostate.   Other: Trace cul-de-sac fluid.  No free intraperitoneal air.   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 Trans pedicle screw fixation at L4-5.   IMPRESSION: 1. Two relatively short-segment small bowel intussusceptions. Although these could be incidental, given patient's clinical history and multiplicity, lead point or lead points cannot be excluded. Consider follow-up with CT enterography or capsule endoscopy. 2. Trace cul-de-sac fluid, nonspecific but present on 12/23/2016. 3.  Gallstones and/or sludge without acute cholecystitis.     Electronically Signed   By: Jeronimo Greaves M.D.   On: 01/03/2022 11:52 ____________________________________________ Other:   _____________________________________________ Assessment & Plan  Assessment: Encounter Diagnoses  Name Primary?   Chronic constipation Yes   Bleeding internal hemorrhoids    Symptomatic internal hemorrhoids with rectal pressure pain and bleeding exacerbated by chronic constipation.  The symptoms have been worsening lately and causing him significant anxiety and impacting his quality of life.  I recommended we try hemorrhoidal banding to see if we can get control of this problem, and if that fails, the colorectal surgery evaluation.  Banding described in detail along with risks and benefits and he was agreeable.  I will do the first banding session today, the next in 2 to 3 weeks and then a third if necessary to follow.  He should take MiraLAX daily to relieve constipation.  ____________________________________   PROCEDURE NOTE: The patient presents with symptomatic grade 2  hemorrhoids, requesting rubber band ligation of his/her hemorrhoidal disease.  All risks, benefits and alternative forms of therapy were described and informed consent was obtained.   The anorectum was pre-medicated with 0.125% NTG and lubricant. The  decision was made to band the RP internal hemorrhoids, and the [facility] was used to perform band ligation without complication.  Digital anorectal examination was then performed to assure proper positioning of the band, and to adjust the banded tissue as required.  The patient was discharged home without pain or other issues.  Dietary and behavioral recommendations were given and along with follow-up instructions.     The following adjunctive treatments were recommended:  Daily miralax  The patient will return 2-3 weeks  for  follow-up and possible additional banding as  required. No complications were encountered and the patient tolerated the procedure well.  Amada Jupiter, MD   Charlie Pitter III

## 2022-07-21 ENCOUNTER — Encounter: Payer: Self-pay | Admitting: Gastroenterology

## 2022-07-21 ENCOUNTER — Ambulatory Visit: Payer: BC Managed Care – PPO | Admitting: Gastroenterology

## 2022-07-21 VITALS — BP 100/60 | HR 87 | Ht 68.0 in | Wt 208.0 lb

## 2022-07-21 DIAGNOSIS — K5909 Other constipation: Secondary | ICD-10-CM | POA: Diagnosis not present

## 2022-07-21 DIAGNOSIS — K648 Other hemorrhoids: Secondary | ICD-10-CM

## 2022-07-21 NOTE — Patient Instructions (Addendum)
_______________________________________________________  If you are age 55 or older, your body mass index should be between 23-30. Your Body mass index is 31.63 kg/m. If this is out of the aforementioned range listed, please consider follow up with your Primary Care Provider.  If you are age 50 or younger, your body mass index should be between 19-25. Your Body mass index is 31.63 kg/m. If this is out of the aformentioned range listed, please consider follow up with your Primary Care Provider.   ________________________________________________________  The [facility] [place] providers would like to encourage you to use [facility] to communicate with providers for non-urgent requests or questions.  Due to long hold times on the telephone, sending your provider a message by [facility] may be a faster and more efficient way to get a response.  Please allow 48 business hours for a response.  Please remember that this is for non-urgent requests.  _______________________________________________________  [place] [facility] PROCEDURE    FOLLOW-UP CARE   The procedure you have had should have been relatively painless since the banding of the area involved does not have nerve endings and there is no pain sensation.  The rubber band cuts off the blood supply to the hemorrhoid and the band may fall off as soon as 48 hours after the banding (the band may occasionally be seen in the toilet bowl following a bowel movement). You may notice a temporary feeling of fullness in the rectum which should respond adequately to plain Tylenol or Motrin.  Following the banding, avoid strenuous exercise that evening and resume full activity the next day.  A sitz bath (soaking in a warm tub) or bidet is soothing, and can be useful for cleansing the area after bowel movements.     To avoid constipation, take two tablespoons of natural wheat bran, natural oat bran, flax, Benefiber or any over the counter fiber supplement and increase  your water intake to 7-8 glasses daily.    Unless you have been prescribed anorectal medication, do not put anything inside your rectum for two weeks: No suppositories, enemas, fingers, etc.  Occasionally, you may have more bleeding than usual after the banding procedure.  This is often from the untreated hemorrhoids rather than the treated one.  Don't be concerned if there is a tablespoon or so of blood.  If there is more blood than this, lie flat with your bottom higher than your head and apply an ice pack to the area. If the bleeding does not stop within a half an hour or if you feel faint, call our office at [phone] or go to the emergency room.  Problems are not common; however, if there is a substantial amount of bleeding, severe pain, chills, fever or difficulty passing urine (very rare) or other problems, you should call us at [phone] or report to the nearest emergency room.  Do not stay seated continuously for more than 2-3 hours for a day or two after the procedure.  Tighten your buttock muscles 10-15 times every two hours and take 10-15 deep breaths every 1-2 hours.  Do not spend more than a few minutes on the toilet if you cannot empty your bowel; instead re-visit the toilet at a later time.   Take Miralax daily and titrate as needed.  It was a pleasure to see you today!  Thank you for trusting me with your gastrointestinal care!

## 2022-08-04 ENCOUNTER — Encounter: Payer: Self-pay | Admitting: Family Medicine

## 2022-08-04 ENCOUNTER — Ambulatory Visit: Payer: BC Managed Care – PPO | Admitting: Family Medicine

## 2022-08-04 VITALS — BP 113/63 | HR 99 | Temp 97.8°F | Resp 16 | Ht 71.0 in | Wt 208.0 lb

## 2022-08-04 DIAGNOSIS — L987 Excessive and redundant skin and subcutaneous tissue: Secondary | ICD-10-CM

## 2022-08-04 DIAGNOSIS — L729 Follicular cyst of the skin and subcutaneous tissue, unspecified: Secondary | ICD-10-CM | POA: Diagnosis not present

## 2022-08-04 NOTE — Progress Notes (Signed)
Chief Complaint  Patient presents with   Cyst    Complains of growth right inner thigh     Brian Velez is a 56 y.o. male here for a skin complaint.  Duration: several days Location: back of R leg Pruritic? No Painful? Yes Drainage? No Other associated symptoms: getting bigger Therapies tried thus far: none  Patient has lost a tremendous amount of weight and has a loose skin around his abdominal and chest region.  He is interested in having it removed.  Past Medical History:  Diagnosis Date   History of gout    Hypertension    MI (myocardial infarction) (HCC) 2004   Sleep apnea    "don't wear mask anymore"   Stroke ([facility]CC) 05/27/2014   "right side still weaker" (05/31/2014)    BP 113/63 (BP Location: Left Arm, Patient Position: Sitting, Cuff Size: Large)   Pulse 99   Temp 97.8 F (36.6 C) (Oral)   Resp 16   Ht 5\' 11"  (1.803 m)   Wt 208 lb (94.3 kg)   SpO2 100%   BMI 29.01 kg/m  Gen: awake, alert, appearing stated age Lungs: No accessory muscle use Skin: 2.3 cm spherical lesion just distal to the ischial tuberosity on the R. No drainage, erythema, TTP, fluctuance, excoriation Psych: Age appropriate judgment and insight  Procedure note; excision of benign lesion Informed consent was obtained. The area was cleaned with alcohol and injected with 2.5 mL of 1% lidocaine with epinephrine. An elliptical track over the area of interest was made w a 15 blade scalpel. Tissue planes dissected down.  Eventually I cut into the capsule and grasped it with the curved forceps and wrested the entire structure from him.  4 Simple 4-0 nylon sutures were placed with good approximation of wound edges. The area was dressed with triple antibiotic ointment and a bandage. There were no complications noted. The patient tolerated the procedure well.  Cyst of skin - Plan: PR EXC SKIN BENIG 2.1-3 CM TRUNK,ARM,LEG  Excess skin - Plan: Ambulatory referral to Plastic Surgery  Successful  excision today.  Patient saw the cyst that was removed.  Wound closure successful.  Removed in 1 week.  Aftercare instructions verbalized written down. Warning signs and symptoms verbalized and written down in AVS.  Refer to plastic surgery. The patient voiced understanding and agreement to the plan.  [place], DO 08/04/22 10:43 AM

## 2022-08-04 NOTE — Patient Instructions (Signed)
Take Metamucil or Benefiber daily.  Stay hydrated.  Do not shower for the rest of the day. When you do wash it, use only soap and water. Do not vigorously scrub. Apply triple antibiotic ointment (like Neosporin) twice daily. Keep the area clean and dry.   Things to look out for: increasing pain not relieved by ibuprofen/acetaminophen, fevers, spreading redness, drainage of pus, or foul odor.  Let us know if you need anything.

## 2022-08-11 ENCOUNTER — Other Ambulatory Visit: Payer: Self-pay | Admitting: Family Medicine

## 2022-08-14 ENCOUNTER — Ambulatory Visit (INDEPENDENT_AMBULATORY_CARE_PROVIDER_SITE_OTHER): Payer: BC Managed Care – PPO | Admitting: Family Medicine

## 2022-08-14 ENCOUNTER — Encounter: Payer: Self-pay | Admitting: Family Medicine

## 2022-08-14 DIAGNOSIS — G4700 Insomnia, unspecified: Secondary | ICD-10-CM | POA: Diagnosis not present

## 2022-08-14 DIAGNOSIS — Z4802 Encounter for removal of sutures: Secondary | ICD-10-CM

## 2022-08-14 MED ORDER — AMITRIPTYLINE HCL 150 MG PO TABS: 150.0000 mg | ORAL_TABLET | Freq: Every day | ORAL | 3 refills | Status: DC

## 2022-08-14 NOTE — Patient Instructions (Signed)
This looks good. Continue antibiotic ointment for a few more days.  Sleep is important to [place] all. Getting good sleep is imperative to adequate functioning during the day. Work with our counselors who are trained to help people obtain quality sleep. Call [phone] to schedule an appointment or if you are curious about insurance coverage/cost.  Sleep Hygiene Tips: Do not watch TV or look at screens within 1 hour of going to bed. If you do, make sure there is a blue light filter (nighttime mode) involved. Try to go to bed around the same time every night. Wake up at the same time within 1 hour of regular time. Ex: If you wake up at 7 AM for work, do not sleep past 8 AM on days that you don't work. Do not drink alcohol before bedtime. Do not consume caffeine-containing beverages after noon or within 9 hours of intended bedtime. Get regular exercise/physical activity in your life, but not within 2 hours of planned bedtime. Do not take naps.  Do not eat within 2 hours of planned bedtime. Melatonin, 3-5 mg 30-60 minutes before planned bedtime may be helpful.  The bed should be for sleep or sex only. If after 20-30 minutes you are unable to fall asleep, get up and do something relaxing. Do this until you feel ready to go to sleep again.   Let us know if you need anything.

## 2022-08-14 NOTE — Progress Notes (Signed)
No chief complaint on file.   Subjective: Patient is a 57 y.o. male here for suture removal.  Patient had a simple cyst removed from his posterior proximal right thigh 1 week ago.  He had 4 simple sutures placed and he is here for removal.  No fevers, drainage, redness, or pain.  He notes improvement in discomfort when he sits.  Patient is a history of insomnia.  He falls asleep without issue but will toss and turn and wake up frequently.  He does watch TV prior to falling asleep.  He feels that his lack of quality sleep is affecting his ability to drive, he works part-time as a Recruitment consultant.  He takes amitriptyline 100 mg daily.  He reports compliance and no adverse effects.  Past Medical History:  Diagnosis Date   History of gout    Hypertension    MI (myocardial infarction) ([place]) 2004   Sleep apnea    "don't wear mask anymore"   Stroke ([place]) 05/27/2014   "right side still weaker" (05/31/2014)    Objective: General: Awake, appears stated age Skin: Posterior proximal right thigh, there is a linear area of excoriation and hyperpigmentation without erythema, excessive warmth, or fluctuance.  4 simple sutures present. Lungs: No accessory muscle use Psych: Age appropriate judgment and insight, normal affect and mood  Assessment and Plan: Visit for suture removal  Insomnia, unspecified type - Plan: amitriptyline (ELAVIL) 150 MG tablet  4 simple sutures removed and shown to the patient.  Continue antibiotic ointment for a few more days.  This looks like it is healing well. Chronic, uncontrolled.  Increase amitriptyline from 100 mg daily to 150 mg daily.  CBT information provided.  Sleep hygiene discussed and general tips put down in his paperwork.  Follow-up in 1 month recheck this. The patient voiced understanding and agreement to the plan.  [place], DO 08/14/22  8:10 AM

## 2022-08-20 ENCOUNTER — Ambulatory Visit: Payer: BC Managed Care – PPO | Admitting: Gastroenterology

## 2022-08-20 ENCOUNTER — Encounter: Payer: Self-pay | Admitting: Gastroenterology

## 2022-08-20 VITALS — BP 140/90 | HR 101 | Ht 69.0 in | Wt 207.0 lb

## 2022-08-20 DIAGNOSIS — K648 Other hemorrhoids: Secondary | ICD-10-CM

## 2022-08-20 NOTE — Progress Notes (Signed)
PROCEDURE NOTE: The patient presents with symptomatic grade 2  hemorrhoids, requesting rubber band ligation of his/her hemorrhoidal disease.  All risks, benefits and alternative forms of therapy were described and informed consent was obtained.  Stool softener and MiraLAX, though he still feels the need to strain. 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 Bleeding decreased after first banding but still present. DRE revealed: Normal   The anorectum was pre-medicated with 0.125% NTG and lubricant. The decision was made to band the RA internal hemorrhoids, and the [place] was used to perform band ligation without complication.  Digital anorectal examination was then performed to assure proper positioning of the band, and to adjust the banded tissue as required.  The patient was discharged home without pain or other issues.  Dietary and behavioral recommendations were given and along with follow-up instructions.     The following adjunctive treatments were recommended:  None  The patient will return 2 to 3 weeks for  follow-up and possible additional banding as required. No complications were encountered and the patient tolerated the procedure well.  Wilfrid Lund, MD

## 2022-08-20 NOTE — Patient Instructions (Signed)
_______________________________________________________  If you are age 56 or older, your body mass index should be between 23-30. Your Body mass index is 30.57 kg/m. If this is out of the aforementioned range listed, please consider follow up with your Primary Care Provider.  If you are age 61 or younger, your body mass index should be between 19-25. Your Body mass index is 30.57 kg/m. If this is out of the aformentioned range listed, please consider follow up with your Primary Care Provider.   ________________________________________________________  The [facility] providers would like to encourage you to use [facility] to communicate with providers for non-urgent requests or questions.  Due to long hold times on the telephone, sending your provider a message by [facility] may be a faster and more efficient way to get a response.  Please allow 48 business hours for a response.  Please remember that this is for non-urgent requests.  _______________________________________________________  Brian Velez PROCEDURE    FOLLOW-UP CARE   The procedure you have had should have been relatively painless since the banding of the area involved does not have nerve endings and there is no pain sensation.  The rubber band cuts off the blood supply to the hemorrhoid and the band may fall off as soon as 48 hours after the banding (the band may occasionally be seen in the toilet bowl following a bowel movement). You may notice a temporary feeling of fullness in the rectum which should respond adequately to plain Tylenol or Motrin.  Following the banding, avoid strenuous exercise that evening and resume full activity the next day.  A sitz bath (soaking in a warm tub) or bidet is soothing, and can be useful for cleansing the area after bowel movements.     To avoid constipation, take two tablespoons of natural wheat bran, natural oat bran, flax, Benefiber or any over the counter fiber supplement and increase  your water intake to 7-8 glasses daily.    Unless you have been prescribed anorectal medication, do not put anything inside your rectum for two weeks: No suppositories, enemas, fingers, etc.  Occasionally, you may have more bleeding than usual after the banding procedure.  This is often from the untreated hemorrhoids rather than the treated one.  Don't be concerned if there is a tablespoon or so of blood.  If there is more blood than this, lie flat with your bottom higher than your head and apply an ice pack to the area. If the bleeding does not stop within a half an hour or if you feel faint, call our office at [phone] or go to the emergency room.  Problems are not common; however, if there is a substantial amount of bleeding, severe pain, chills, fever or difficulty passing urine (very rare) or other problems, you should call us at [phone] or report to the nearest emergency room.  Do not stay seated continuously for more than 2-3 hours for a day or two after the procedure.  Tighten your buttock muscles 10-15 times every two hours and take 10-15 deep breaths every 1-2 hours.  Do not spend more than a few minutes on the toilet if you cannot empty your bowel; instead re-visit the toilet at a later time.      It was a pleasure to see you today!  Thank you for trusting me with your gastrointestinal care!

## 2022-09-01 ENCOUNTER — Encounter: Payer: Self-pay | Admitting: Psychology

## 2022-09-02 ENCOUNTER — Ambulatory Visit: Payer: PRIVATE HEALTH INSURANCE | Admitting: Gastroenterology

## 2022-09-02 ENCOUNTER — Ambulatory Visit: Payer: BC Managed Care – PPO | Admitting: Gastroenterology

## 2022-09-09 ENCOUNTER — Telehealth: Payer: BC Managed Care – PPO | Admitting: Nurse Practitioner

## 2022-09-09 ENCOUNTER — Other Ambulatory Visit: Payer: Self-pay | Admitting: Nurse Practitioner

## 2022-09-09 DIAGNOSIS — U071 COVID-19: Secondary | ICD-10-CM | POA: Diagnosis not present

## 2022-09-09 MED ORDER — MOLNUPIRAVIR EUA 200MG CAPSULE: 4.0000 | ORAL_CAPSULE | Freq: Two times a day (BID) | ORAL | 0 refills | Status: AC

## 2022-09-09 NOTE — Progress Notes (Signed)
Virtual Visit Consent   Brian Velez, you are scheduled for a virtual visit with a [facility] provider today. Just as with appointments in the office, your consent must be obtained to participate. Your consent will be active for this visit and any virtual visit you may have with one of our providers in the next 365 days. If you have a MyChart account, a copy of this consent can be sent to you electronically.  As this is a virtual visit, video technology does not allow for your provider to perform a traditional examination. This may limit your provider's ability to fully assess your condition. If your provider identifies any concerns that need to be evaluated in person or the need to arrange testing (such as labs, EKG, etc.), we will make arrangements to do so. Although advances in technology are sophisticated, we cannot ensure that it will always work on either your end or our end. If the connection with a video visit is poor, the visit may have to be switched to a telephone visit. With either a video or telephone visit, we are not always able to ensure that we have a secure connection.  By engaging in this virtual visit, you consent to the provision of healthcare and authorize for your insurance to be billed (if applicable) for the services provided during this visit. Depending on your insurance coverage, you may receive a charge related to this service.  I need to obtain your verbal consent now. Are you willing to proceed with your visit today? Brian Velez has provided verbal consent on 09/09/2022 for a virtual visit (video or telephone). Brian Schneiders, FNP  Date: 09/09/2022 10:57 AM  Virtual Visit via Video Note   I, Brian Velez, [name]  ([id number], June 15, 1966) on 09/09/22 at 11:00 AM EST by a video-enabled telemedicine application and verified that I am speaking with the correct person using two identifiers.  Location: Patient: Virtual Visit Location Patient:  Home Provider: Virtual Visit Location Provider: Home Office   I discussed the limitations of evaluation and management by telemedicine and the availability of in person appointments. The patient expressed understanding and agreed to proceed.    History of Present Illness: Brian Velez is a 57 y.o. who identifies as a male who was assigned male at birth, and is being seen today after testing positive for COVID today.   Symptoms today include: sinus congestion, and cough  Appetite is good   Symptom onset was 4 days ago  Initially had a fever that has resolved   He has had COVID once in the past  He has been vaccinated for COVID   Labs from November 2023 GFR 84  Denies a history of kidney disease   Denies any respiratory illness or need for inhalers in the past    Problems:  Patient Active Problem List   Diagnosis Date Noted   Cluster headache, not intractable 04/17/2022   Chronic daily headache 02/11/2022   Mixed incontinence urge and stress 09/30/2021   BPH (benign prostatic hyperplasia) 06/24/2021   Cervical radiculopathy 05/27/2020   Lumbar radiculopathy 05/27/2020   Chronic gout involving toe of left foot without tophus 10/28/2018   Coronary artery disease involving native heart with angina pectoris ([place]) 12/15/2017   Hyperlipidemia 12/10/2017   Neck pain 12/10/2017   Radicular pain in right arm 12/10/2017   Dyspnea on exertion 12/10/2017   History of myocardial infarction 11/19/2017   History of stroke 11/19/2017   Heart  disease 11/19/2017   History of gout 11/19/2017   Vaccine counseling 11/19/2017   Need for Tdap vaccination 11/19/2017   Insomnia 11/19/2017   Essential hypertension 10/19/2017   Family history of hypertension 10/19/2017   Family history of prostate cancer 10/19/2017   Family history of colon cancer 10/19/2017   Family history of diabetes mellitus 10/19/2017   Encounter for hepatitis C screening test for low risk patient 10/19/2017   Ulnar  nerve impingement 06/01/2014   Atypical chest pain 05/31/2014    Allergies: No Known Allergies  Medications:  Current Outpatient Medications:    amitriptyline (ELAVIL) 150 MG tablet, Take 1 tablet (150 mg total) by mouth at bedtime., Disp: 30 tablet, Rfl: 3   gabapentin (NEURONTIN) 300 MG capsule, TAKE 2 CAPSULES IN THE MORNING AND AFTERNOON. TAKE 3 IN THE EVENING., Disp: 210 capsule, Rfl: 5   hydrochlorothiazide (HYDRODIURIL) 25 MG tablet, Take 1 tablet (25 mg total) by mouth daily., Disp: 90 tablet, Rfl: 3   linaclotide (LINZESS) 72 MCG capsule, Take 1 capsule (72 mcg total) by mouth daily before breakfast. Lot: [id number] Exp: 12/23, Disp: 12 capsule, Rfl: 0   Multiple Vitamins-Minerals (MULTIVITAMIN WITH MINERALS) tablet, Take 1 tablet by mouth daily., Disp: , Rfl:    olmesartan (BENICAR) 20 MG tablet, TAKE 1 TABLET BY MOUTH EVERY DAY, Disp: 30 tablet, Rfl: 2   oxybutynin (DITROPAN XL) 5 MG 24 hr tablet, Take 1 tablet (5 mg total) by mouth at bedtime., Disp: 30 tablet, Rfl: 2   pantoprazole (PROTONIX) 40 MG tablet, TAKE 1 TABLET BY MOUTH EVERY DAY, Disp: 30 tablet, Rfl: 3   SUMAtriptan (IMITREX) 100 MG tablet, TAKE 1 TABLET (100 MG TOTAL) BY MOUTH EVERY 2 (TWO) HOURS AS NEEDED FOR MIGRAINE. MAY REPEAT IN 2 HOURS IF HEADACHE PERSISTS OR RECURS., Disp: 10 tablet, Rfl: 0   topiramate (TOPAMAX) 25 MG tablet, Take 1 tab/dayfor 3 days then 1 tab 2x/day for 3 days. Add a pill in AM and then PM over 3 day intervals until taking 2 pills 2x/day., Disp: 120 tablet, Rfl: 1   verapamil (CALAN-SR) 120 MG CR tablet, TAKE 1 TABLET BY MOUTH 2 TIMES DAILY., Disp: 60 tablet, Rfl: 1  Observations/Objective: Patient is well-developed, well-nourished in no acute distress.  Resting comfortably  at home.  Head is normocephalic, atraumatic.  No labored breathing.  Speech is clear and coherent with logical content.  Patient is alert and oriented at baseline.    Assessment and Plan: 1. COVID-19  - molnupiravir  EUA (LAGEVRIO) 200 mg CAPS capsule; Take 4 capsules (800 mg total) by mouth 2 (two) times daily for 5 days.  Dispense: 40 capsule; Refill: 0    Advised use of Coricidin HBP as needed for symptom relief.   Push fluids, rest, assure protein intake   Follow Up Instructions: I discussed the assessment and treatment plan with the patient. The patient was provided an opportunity to ask questions and all were answered. The patient agreed with the plan and demonstrated an understanding of the instructions.  A copy of instructions were sent to the patient via MyChart unless otherwise noted below.    The patient was advised to call back or seek an in-person evaluation if the symptoms worsen or if the condition fails to improve as anticipated.  Time:  I spent 15 minutes with the patient via telehealth technology discussing the above problems/concerns.    Brian Schneiders, FNP

## 2022-09-14 ENCOUNTER — Encounter: Payer: Self-pay | Admitting: Gastroenterology

## 2022-09-14 ENCOUNTER — Ambulatory Visit: Payer: BC Managed Care – PPO | Admitting: Gastroenterology

## 2022-09-14 VITALS — BP 130/80 | HR 110 | Ht 69.0 in | Wt 201.0 lb

## 2022-09-14 DIAGNOSIS — K648 Other hemorrhoids: Secondary | ICD-10-CM

## 2022-09-14 NOTE — Progress Notes (Signed)
Reports ongoing bleeding and rectal pressure   PROCEDURE NOTE: The patient presents with symptomatic grade 2  hemorrhoids, requesting rubber band ligation of his/her hemorrhoidal disease.  All risks, benefits and alternative forms of therapy were described and informed consent was obtained.  DRE revealed: nml   The anorectum was pre-medicated with 0.125% NTG and lubricant. The decision was made to band the LL internal hemorrhoids, and the [place] was used to perform band ligation without complication.  Digital anorectal examination was then performed to assure proper positioning of the band, and to adjust the banded tissue as required.  The patient was discharged home without pain or other issues.  Dietary and behavioral recommendations were given and along with follow-up instructions.     The following adjunctive treatments were recommended:  none  The patient will return 2-3 weeks  for anoscopy follow-up and possible additional banding as required or referral to colorectal surgery No complications were encountered and the patient tolerated the procedure well.  Wilfrid Lund, MD

## 2022-09-14 NOTE — Patient Instructions (Signed)
_______________________________________________________  If your blood pressure at your visit was 140/90 or greater, please contact your primary care physician to follow up on this.  _______________________________________________________  If you are age 56 or older, your body mass index should be between 23-30. Your Body mass index is 29.68 kg/m. If this is out of the aforementioned range listed, please consider follow up with your Primary Care Provider.  If you are age 56 or younger, your body mass index should be between 19-25. Your Body mass index is 29.68 kg/m. If this is out of the aformentioned range listed, please consider follow up with your Primary Care Provider.   ________________________________________________________  The [facility] providers would like to encourage you to use [facility] to communicate with providers for non-urgent requests or questions.  Due to long hold times on the telephone, sending your provider a message by [facility] may be a faster and more efficient way to get a response.  Please allow 48 business hours for a response.  Please remember that this is for non-urgent requests.  _______________________________________________________  [facility] PROCEDURE    FOLLOW-UP CARE   The procedure you have had should have been relatively painless since the banding of the area involved does not have nerve endings and there is no pain sensation.  The rubber band cuts off the blood supply to the hemorrhoid and the band may fall off as soon as 48 hours after the banding (the band may occasionally be seen in the toilet bowl following a bowel movement). You may notice a temporary feeling of fullness in the rectum which should respond adequately to plain Tylenol or Motrin.  Following the banding, avoid strenuous exercise that evening and resume full activity the next day.  A sitz bath (soaking in a warm tub) or bidet is soothing, and can be useful for cleansing the area  after bowel movements.     To avoid constipation, take two tablespoons of natural wheat bran, natural oat bran, flax, Benefiber or any over the counter fiber supplement and increase your water intake to 7-8 glasses daily.    Unless you have been prescribed anorectal medication, do not put anything inside your rectum for two weeks: No suppositories, enemas, fingers, etc.  Occasionally, you may have more bleeding than usual after the banding procedure.  This is often from the untreated hemorrhoids rather than the treated one.  Don't be concerned if there is a tablespoon or so of blood.  If there is more blood than this, lie flat with your bottom higher than your head and apply an ice pack to the area. If the bleeding does not stop within a half an hour or if you feel faint, call our office at [phone] or go to the emergency room.  Problems are not common; however, if there is a substantial amount of bleeding, severe pain, chills, fever or difficulty passing urine (very rare) or other problems, you should call us at [phone] or report to the nearest emergency room.  Do not stay seated continuously for more than 2-3 hours for a day or two after the procedure.  Tighten your buttock muscles 10-15 times every two hours and take 10-15 deep breaths every 1-2 hours.  Do not spend more than a few minutes on the toilet if you cannot empty your bowel; instead re-visit the toilet at a later time.   It was a pleasure to see you today!  Thank you for trusting me with your gastrointestinal care!

## 2022-09-15 ENCOUNTER — Encounter: Payer: Self-pay | Admitting: Gastroenterology

## 2022-09-23 ENCOUNTER — Encounter: Payer: Self-pay | Admitting: Family Medicine

## 2022-09-28 ENCOUNTER — Other Ambulatory Visit: Payer: Self-pay | Admitting: Family Medicine

## 2022-09-28 DIAGNOSIS — R413 Other amnesia: Secondary | ICD-10-CM

## 2022-09-30 ENCOUNTER — Ambulatory Visit: Payer: BC Managed Care – PPO | Admitting: Family Medicine

## 2022-09-30 ENCOUNTER — Encounter: Payer: Self-pay | Admitting: Family Medicine

## 2022-09-30 VITALS — BP 120/70 | HR 105 | Temp 98.2°F

## 2022-09-30 DIAGNOSIS — M109 Gout, unspecified: Secondary | ICD-10-CM | POA: Diagnosis not present

## 2022-09-30 MED ORDER — COLCHICINE 0.6 MG PO TABS: ORAL_TABLET | ORAL | 2 refills | Status: DC

## 2022-09-30 MED ORDER — METHYLPREDNISOLONE ACETATE 80 MG/ML IJ SUSP
80.0000 mg | Freq: Once | INTRAMUSCULAR | Status: AC
  Administered 2022-09-30: 80 mg via INTRAMUSCULAR

## 2022-09-30 NOTE — Progress Notes (Signed)
Musculoskeletal Exam  Patient: Brian Velez DOB: [birth year]  DOS: 09/30/2022  SUBJECTIVE:  Chief Complaint:   Chief Complaint  Patient presents with   Gout    Right foot Needs a note for work    Brian Velez is a 57 y.o.  male for evaluation and treatment of R foot pain.   Onset:  1 day ago. No inj or change in activity.  No change in diet Location: Right ankle and right first MTP Character:  sharp  Progression of issue:  is unchanged Associated symptoms: Swelling and redness, warmth No fevers, calf pain, bruising Treatment: to date has been OTC NSAIDS and cherry tart extract.   Neurovascular symptoms: no  Past Medical History:  Diagnosis Date   History of gout    Hypertension    MI (myocardial infarction) ([place]) 2004   Sleep apnea    "don't wear mask anymore"   Stroke ([place]) 05/27/2014   "right side still weaker" (05/31/2014)    Objective: VITAL SIGNS: BP 120/70 (BP Location: Left Arm, Patient Position: Sitting, Cuff Size: Normal)   Pulse (!) 105   Temp 98.2 F (36.8 C) (Oral)   SpO2 95%  Constitutional: Well formed, well developed. No acute distress. Thorax & Lungs: No accessory muscle use Musculoskeletal: Right foot.   Tenderness to palpation: Yes over first MTP and ankle joint; no calf TTP Some soft tissue swelling over both affected joints Ecchymosis: no Tests positive: none Tests negative: [place] Neurologic: Normal sensory function.  Psychiatric: Normal mood.   Assessment:  Acute gout involving toe of right foot, unspecified cause - Plan: colchicine 0.6 MG tablet, Uric acid, methylPREDNISolone acetate (DEPO-MEDROL) injection 80 mg  Plan: Exacerbation of chronic issue.  IM injection of Depo-Medrol 80 mg.  We will resend in colchicine to take during flares.  Check uric acid today as he has had 3 flares in the past 6 months.  No obvious triggers for this.  Ice, Tylenol.  F/u as originally scheduled. The patient voiced understanding and agreement to  the plan.   [place], DO 09/30/22  2:51 PM

## 2022-09-30 NOTE — Patient Instructions (Addendum)
Give [place] 2-3 business days to get the results of your labs back.   Ice/cold pack over area for 10-15 min twice daily.  Foods to AVOID: Red meat, organ meat (liver), lunch meat, seafood (mussels, scallops, anchovies, etc) Alcohol Sugary foods/beverages (diet soft drinks have no link to flares)  Foods to migrate to: Dairy Vegetables Cherries have limited data to suggest they help lower uric acid levels (and prevent flares) Vit C (500 mg daily) may have a modest effect with preventing flares Poultry If you are going to eat red meat, beef and pork may give you less problems than lamb.  Let us know if you need anything.

## 2022-10-01 LAB — URIC ACID: Uric Acid, Serum: 7.4 mg/dL (ref 4.0–7.8)

## 2022-10-02 ENCOUNTER — Other Ambulatory Visit: Payer: Self-pay | Admitting: Family Medicine

## 2022-10-02 MED ORDER — ALLOPURINOL 100 MG PO TABS: 100.0000 mg | ORAL_TABLET | Freq: Every day | ORAL | 6 refills | Status: DC

## 2022-10-21 NOTE — Progress Notes (Unsigned)
[facility] Progress Note  Chief Complaint: Rectal pain, constipation and bleeding  Subjective  History: Brian Velez is here to see me for rectal pain and bleeding.  He has undergone 3 sessions of hemorrhoidal banding and is here today for reevaluation.  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  He will feel the need for BM, but because of his job cannot necessarily stop to use the bathroom and he wants to.  Then when he has the ability to do so, little or nothing may pass, then other times he may have urgency with an episode of incontinence.  He still has bleeding, but much less than prior to banding. As before, he is still concerned about the possibility of colorectal cancer, and I again reassured him that he had good findings on colonoscopy May 2023  ROS: Cardiovascular:  no chest pain Respiratory: no dyspnea  The patient's Past Medical, Family and Social History were reviewed and are on file in the EMR.  Objective:  Med list reviewed  Current Outpatient Medications:    allopurinol (ZYLOPRIM) 100 MG tablet, Take 1 tablet (100 mg total) by mouth daily., Disp: 30 tablet, Rfl: 6   amitriptyline (ELAVIL) 150 MG tablet, Take 1 tablet (150 mg total) by mouth at bedtime., Disp: 30 tablet, Rfl: 3   colchicine 0.6 MG tablet, Take 2 tabs and repeat a tab 1 hr later. Take daily until flare resolves., Disp: 30 tablet, Rfl: 2   gabapentin (NEURONTIN) 300 MG capsule, TAKE 2 CAPSULES IN THE MORNING AND AFTERNOON. TAKE 3 IN THE EVENING., Disp: 210 capsule, Rfl: 5   hydrochlorothiazide (HYDRODIURIL) 25 MG tablet, Take 1 tablet (25 mg total) by mouth daily., Disp: 90 tablet, Rfl: 3   linaclotide (LINZESS) 72 MCG capsule, Take 1 capsule (72 mcg total) by mouth daily before breakfast. [id number] Exp: 12/23, Disp: 12 capsule, Rfl: 0   Multiple Vitamins-Minerals (MULTIVITAMIN WITH MINERALS) tablet, Take 1  tablet by mouth daily., Disp: , Rfl:    olmesartan (BENICAR) 20 MG tablet, TAKE 1 TABLET BY MOUTH EVERY DAY, Disp: 30 tablet, Rfl: 2   oxybutynin (DITROPAN XL) 5 MG 24 hr tablet, Take 1 tablet (5 mg total) by mouth at bedtime., Disp: 30 tablet, Rfl: 2   pantoprazole (PROTONIX) 40 MG tablet, TAKE 1 TABLET BY MOUTH EVERY DAY, Disp: 30 tablet, Rfl: 3   SUMAtriptan (IMITREX) 100 MG tablet, TAKE 1 TABLET (100 MG TOTAL) BY MOUTH EVERY 2 (TWO) HOURS AS NEEDED FOR MIGRAINE. MAY REPEAT IN 2 HOURS IF HEADACHE PERSISTS OR RECURS., Disp: 10 tablet, Rfl: 0   topiramate (TOPAMAX) 25 MG tablet, Take 1 tab/dayfor 3 days then 1 tab 2x/day for 3 days. Add a pill in AM and then PM over 3 day intervals until taking 2 pills 2x/day., Disp: 120 tablet, Rfl: 1   verapamil (CALAN-SR) 120 MG CR tablet, TAKE 1 TABLET BY MOUTH 2 TIMES DAILY., Disp: 60 tablet, Rfl: 1   Vital signs in last 24 hrs: Vitals:   10/22/22 0825  BP: 126/78  Pulse: 86   Wt Readings from Last 3 Encounters:  09/14/22 201 lb (91.2 kg)  08/20/22 207 lb (93.9 kg)  08/04/22 208 lb (94.3 kg)    Physical Exam   Cardiac: Regular without murmur,  no peripheral edema Pulm: clear to auscultation bilaterally, normal RR and effort noted Abdomen: soft, no tenderness, with active bowel  sounds. No guarding or palpable hepatosplenomegaly. Perianal exam normal.  DRE with increased resting sphincter tone as before (he has a difficult time relaxing for the exam). Normal DRE, no fissure or tenderness or palpable internal lesion. Anoscopy: Persistent internal hemorrhoids, smaller than before, right greater than left   Labs:   ___________________________________________ Radiologic studies:   ____________________________________________ Other:   _____________________________________________ Assessment & Plan  Assessment: Encounter Diagnoses  Name Primary?   Bleeding internal hemorrhoids Yes   Chronic constipation    Rectal pain    [name]  continues to have symptoms that are more than expected just from internal hemorrhoids.  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 Persistent hemorrhoids despite banding, surgery could be considered that would relieve the bleeding but not necessarily the remainder of symptoms.  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  All of this has led to significant anxiety and distress regarding symptoms. Plan: Modest bowel regimen with 200 mg Colace every night and a tablespoon of Benefiber every morning.  Will make the stool to loose or frequent with something like MiraLAX, he may have more episodes of incontinence for reasons noted above.  Nitroglycerin ointment, apply 3 times daily to the anal canal, perhaps this will relieve any anal spasm that is present.  Anorectal manometry, I described the procedure and that is probably a few months off because of current hospital-based endoscopy staffing.  Consult with colorectal surgery.   Brian Velez

## 2022-10-22 ENCOUNTER — Ambulatory Visit (INDEPENDENT_AMBULATORY_CARE_PROVIDER_SITE_OTHER): Payer: BC Managed Care – PPO | Admitting: Gastroenterology

## 2022-10-22 ENCOUNTER — Encounter: Payer: Self-pay | Admitting: Gastroenterology

## 2022-10-22 VITALS — BP 126/78 | HR 86 | Ht 69.0 in

## 2022-10-22 DIAGNOSIS — K5909 Other constipation: Secondary | ICD-10-CM

## 2022-10-22 DIAGNOSIS — K6289 Other specified diseases of anus and rectum: Secondary | ICD-10-CM | POA: Diagnosis not present

## 2022-10-22 DIAGNOSIS — K648 Other hemorrhoids: Secondary | ICD-10-CM | POA: Diagnosis not present

## 2022-10-22 MED ORDER — AMBULATORY NON FORMULARY MEDICATION: 1 refills | Status: AC

## 2022-10-22 NOTE — Patient Instructions (Addendum)
_______________________________________________________  If your blood pressure at your visit was 140/90 or greater, please contact your primary care physician to follow up on this.  _______________________________________________________  If you are age 56 or older, your body mass index should be between 23-30. Your Body mass index is 29.68 kg/m. If this is out of the aforementioned range listed, please consider follow up with your Primary Care Provider.  If you are age 19 or younger, your body mass index should be between 19-25. Your Body mass index is 29.68 kg/m. If this is out of the aformentioned range listed, please consider follow up with your Primary Care Provider.   ________________________________________________________  The [facility] providers would like to encourage you to use [facility] to communicate with providers for non-urgent requests or questions.  Due to long hold times on the telephone, sending your provider a message by [facility] may be a faster and more efficient way to get a response.  Please allow 48 business hours for a response.  Please remember that this is for non-urgent requests.  _______________________________________________________  We will send your records to [facility]. [facility] is located at [address].  You can contact them at [phone].   Start Colace '200mg'$   at bedtime nightly.   Start Benefiber one tablespoon every morning.    Your provider has prescribed Nitroglycerin ointment for you. Please follow the directions written on your prescription bottle or given to you specifically by your provider. Since this is a specialty medication and is not readily available at most local pharmacies, we have sent your prescription to:  [facility] information is below: Address: [address] [place], [place] [address]  [phone]  *Please DO NOT go directly from our office to pick up  this medication! Give the pharmacy 1 day to process the prescription as this is compounded and takes time to make.   Patient Drug Education for Nitroglycerin Ointment   A pea-sized drop should be placed on the tip of your finger and then gently placed inside the anus. The finger should be inserted 1/3 - 1/2 its length and may be covered with a plastic glove or finger cot. You may use Vaseline to help coat the finger or dilute the ointment.  The first few applications should be taken lying down, as mild light-headedness or a brief headache may occur.  The most common side effect - a headache. It is usually brief and mild, but may require Tylenol or Advil. You may dilute the NTG further with Vaseline to decrease the headaches. As the treatment progresses and the hemorrhoid begins to heal, the headaches will tend to dissipate. Other side effects include lightheadedness, flushing, dizziness, nervousness, nausea, and vomiting. If any of these side effects persist or worsen, notify us promptly. Stop using the NTG and notify us immediately if you develop the rare side effects of severe dizziness, fainting, fast/pounding heartbeat, paleness, sweating, blurred vision, dry mouth, dark urine, bluish lips/skin/nails, unusual tiredness, severe weakness, irregular heartbeat, seizures, or chest pain. Serious allergic reactions are unusual, but seek immediate medical attention if you develop a rash, swelling, dizziness, or trouble breathing.  Tell us if you are allergic to nitrates, have severe anemia, low blood pressure, dehydration, chronic heart failure, cardiomyopathy, recent heart attack, increased pressure in the brain, or exposure to nitrates while on the job. Do not use NTG while driving or working around machinery if you are drowsy, dizzy, have lightheadedness, or blurred vision. Limit alcoholic beverages.  To minimize dizziness and lightheadedness, get up slowly when rising from a sitting or lying position.  The elderly may be more prone to dizziness and falling. While there are not adequate studies to confirm the safety of NTG in pregnant or breast feeding women, it has been used without incident so far. We recommend waiting at least one hour after applying the NTG ointment before breast feeding.  Do not use NTG ointment if you are taking drugs for sexual problems [e.g., sildenafil (Viagra), tadalafil (Cialis), vardenafil (Levitra)]. Use caution before taking cough-and-cold products, diet aids, or NSAIDs preparations because they may contain ingredients that could increase your blood pressure, cause a fast heartbeat, or increase chest pain (e.g., pseudoephedrine, phenylephrine, chlorpheniramine, diphenhydramine, clemastine, ibuprofen, and naproxen). Tell us if you drink alcohol, take alteplase, migraine drugs (ergotamine), water pills/diuretics such as furosemide or hydrochlorothiazide, or other drugs for high blood pressure (beta blockers, calcium channel blockers, ACE inhibitors).  Store the NTG at room temperature and keep away from light and moisture. Close the container tightly after each use. Do not store in the bathroom. Keep away from children and pets. If you have any questions or problems please call us at  [phone].   It was a pleasure to see you today!  Thank you for trusting me with your gastrointestinal care!

## 2022-10-23 ENCOUNTER — Telehealth: Payer: Self-pay

## 2022-10-23 NOTE — Telephone Encounter (Signed)
Records have been sent to [facility] for [employer]

## 2022-10-26 ENCOUNTER — Encounter: Payer: Self-pay | Admitting: Physician Assistant

## 2022-10-26 ENCOUNTER — Ambulatory Visit: Payer: BC Managed Care – PPO

## 2022-10-26 ENCOUNTER — Ambulatory Visit: Payer: BC Managed Care – PPO | Admitting: Physician Assistant

## 2022-10-26 VITALS — BP 130/79 | HR 111 | Ht 69.0 in | Wt 197.0 lb

## 2022-10-26 DIAGNOSIS — G3184 Mild cognitive impairment, so stated: Secondary | ICD-10-CM | POA: Diagnosis not present

## 2022-10-26 DIAGNOSIS — R413 Other amnesia: Secondary | ICD-10-CM

## 2022-10-26 NOTE — Patient Instructions (Signed)
It was a pleasure to see you today at our office.   Recommendations:  Neurocognitive evaluation  MRI of the brain, the radiology office will call you to arrange you appointment Follow up in early may to go over the results Recommend psychotherapy  Whom to call:  Memory  decline, memory medications: Call our office [phone]   For psychiatric meds, mood meds: Please have your primary care physician manage these medications.   Counseling regarding caregiver distress, including caregiver depression, anxiety and issues regarding community resources, adult day care programs, adult living facilities, or memory care questions:   Feel free to contact [place], Social Worker at [phone]   For assessment of decision of mental capacity and competency:  Call Dr. Anthoney Harada, geriatric psychiatrist at [phone]  For guidance in geriatric dementia issues please call [facility] [phone]    If you have any severe symptoms of a stroke, or other severe issues such as confusion,severe chills or fever, etc call 911 or go to the ER as you may need to be evaluated further   Feel free to visit Facebook page " [email or web address]" for tips of how to care for people with memory problems.        RECOMMENDATIONS FOR ALL PATIENTS WITH MEMORY PROBLEMS: 1. Continue to exercise (Recommend 30 minutes of walking everyday, or 3 hours every week) 2. Increase social interactions - continue going to [place] and enjoy social gatherings with friends and family 3. Eat healthy, avoid fried foods and eat more fruits and vegetables 4. Maintain adequate blood pressure, blood sugar, and blood cholesterol level. Reducing the risk of stroke and cardiovascular disease also helps promoting better memory. 5. Avoid stressful situations. Live a simple life and avoid aggravations. Organize your time and prepare for the next day in anticipation. 6. Sleep well, avoid any interruptions of sleep and avoid any  distractions in the bedroom that may interfere with adequate sleep quality 7. Avoid sugar, avoid sweets as there is a strong link between excessive sugar intake, diabetes, and cognitive impairment We discussed the Mediterranean diet, which has been shown to help patients reduce the risk of progressive memory disorders and reduces cardiovascular risk. This includes eating fish, eat fruits and green leafy vegetables, nuts like almonds and hazelnuts, walnuts, and also use olive oil. Avoid fast foods and fried foods as much as possible. Avoid sweets and sugar as sugar use has been linked to worsening of memory function.  There is always a concern of gradual progression of memory problems. If this is the case, then we may need to adjust level of care according to patient needs. Support, both to the patient and caregiver, should then be put into place.      You have been referred for a neuropsychological evaluation (i.e., evaluation of memory and thinking abilities). Please bring someone with you to this appointment if possible, as it is helpful for the doctor to hear from both you and another adult who knows you well. Please bring eyeglasses and hearing aids if you wear them.    The evaluation will take approximately 3 hours and has two parts:   The first part is a clinical interview with the neuropsychologist (Dr. Melvyn Novas or Dr. Nicole Kindred). During the interview, the neuropsychologist will speak with you and the individual you brought to the appointment.    The second part of the evaluation is testing with the doctor's technician Hinton Dyer or Maudie Mercury). During the testing, the technician will ask you to remember different  types of material, solve problems, and answer some questionnaires. Your family member will not be present for this portion of the evaluation.   Please note: We must reserve several hours of the neuropsychologist's time and the psychometrician's time for your evaluation appointment. As such, there is  a No-Show fee of $100. If you are unable to attend any of your appointments, please contact our office as soon as possible to reschedule.    FALL PRECAUTIONS: Be cautious when walking. Scan the area for obstacles that may increase the risk of trips and falls. When getting up in the mornings, sit up at the edge of the bed for a few minutes before getting out of bed. Consider elevating the bed at the head end to avoid drop of blood pressure when getting up. Walk always in a well-lit room (use night lights in the walls). Avoid area rugs or power cords from appliances in the middle of the walkways. Use a walker or a cane if necessary and consider physical therapy for balance exercise. Get your eyesight checked regularly.  FINANCIAL OVERSIGHT: Supervision, especially oversight when making financial decisions or transactions is also recommended.  HOME SAFETY: Consider the safety of the kitchen when operating appliances like stoves, microwave oven, and blender. Consider having supervision and share cooking responsibilities until no longer able to participate in those. Accidents with firearms and other hazards in the house should be identified and addressed as well.   ABILITY TO BE LEFT ALONE: If patient is unable to contact 911 operator, consider using LifeLine, or when the need is there, arrange for someone to stay with patients. Smoking is a fire hazard, consider supervision or cessation. Risk of wandering should be assessed by caregiver and if detected at any point, supervision and safe proof recommendations should be instituted.  MEDICATION SUPERVISION: Inability to self-administer medication needs to be constantly addressed. Implement a mechanism to ensure safe administration of the medications.   DRIVING: Regarding driving, in patients with progressive memory problems, driving will be impaired. We advise to have someone else do the driving if trouble finding directions or if minor accidents are  reported. Independent driving assessment is available to determine safety of driving.   If you are interested in the driving assessment, you can contact the following:  [employer] in [place]  [place] [phone] or [phone]    [place] refers to food and lifestyle choices that are based on the traditions of countries located on the [employer]. This way of eating has been shown to help prevent certain conditions and improve outcomes for people who have chronic diseases, like kidney disease and heart disease. What are tips for following this plan? Lifestyle  Cook and eat meals together with your family, when possible. Drink enough fluid to keep your urine clear or pale yellow. Be physically active every day. This includes: Aerobic exercise like running or swimming. Leisure activities like gardening, walking, or housework. Get 7-8 hours of sleep each night. If recommended by your health care provider, drink red wine in moderation. This means 1 glass a day for nonpregnant women and 2 glasses a day for men. A glass of wine equals 5 oz (150 mL). Reading food labels  Check the serving size of packaged foods. For foods such as rice and pasta, the serving size refers to the amount of cooked product, not dry. Check the total fat in packaged foods. Avoid  foods that have saturated fat or trans fats. Check the ingredients list for added sugars, such as corn syrup. Shopping  At the grocery store, buy most of your food from the areas near the walls of the store. This includes: Fresh fruits and vegetables (produce). Grains, beans, nuts, and seeds. Some of these may be available in unpackaged forms or large amounts (in bulk). Fresh seafood. Poultry and eggs. Low-fat dairy products. Buy whole ingredients instead of prepackaged  foods. Buy fresh fruits and vegetables in-season from local farmers markets. Buy frozen fruits and vegetables in resealable bags. If you do not have access to quality fresh seafood, buy precooked frozen shrimp or canned fish, such as tuna, salmon, or sardines. Buy small amounts of raw or cooked vegetables, salads, or olives from the deli or salad bar at your store. Stock your pantry so you always have certain foods on hand, such as olive oil, canned tuna, canned tomatoes, rice, pasta, and beans. Cooking  Cook foods with extra-virgin olive oil instead of using butter or other vegetable oils. Have meat as a side dish, and have vegetables or grains as your main dish. This means having meat in small portions or adding small amounts of meat to foods like pasta or stew. Use beans or vegetables instead of meat in common dishes like chili or lasagna. Experiment with different cooking methods. Try roasting or broiling vegetables instead of steaming or sauteing them. Add frozen vegetables to soups, stews, pasta, or rice. Add nuts or seeds for added healthy fat at each meal. You can add these to yogurt, salads, or vegetable dishes. Marinate fish or vegetables using olive oil, lemon juice, garlic, and fresh herbs. Meal planning  Plan to eat 1 vegetarian meal one day each week. Try to work up to 2 vegetarian meals, if possible. Eat seafood 2 or more times a week. Have healthy snacks readily available, such as: Vegetable sticks with hummus. Greek yogurt. Fruit and nut trail mix. Eat balanced meals throughout the week. This includes: Fruit: 2-3 servings a day Vegetables: 4-5 servings a day Low-fat dairy: 2 servings a day Fish, poultry, or lean meat: 1 serving a day Beans and legumes: 2 or more servings a week Nuts and seeds: 1-2 servings a day Whole grains: 6-8 servings a day Extra-virgin olive oil: 3-4 servings a day Limit red meat and sweets to only a few servings a month What are my food  choices? Mediterranean diet Recommended Grains: Whole-grain pasta. Brown rice. Bulgar wheat. Polenta. Couscous. Whole-wheat bread. [place]. Vegetables: Artichokes. Beets. Broccoli. Cabbage. Carrots. Eggplant. Green beans. Chard. Kale. Spinach. Onions. Leeks. Peas. Squash. Tomatoes. Peppers. Radishes. Fruits: Apples. Apricots. Avocado. Berries. Bananas. Cherries. Dates. Figs. Grapes. Lemons. Melon. Oranges. Peaches. Plums. Pomegranate. Meats and other protein foods: Beans. Almonds. Sunflower seeds. Pine nuts. Peanuts. [place]. Salmon. Scallops. Shrimp. [place]. Tilapia. Clams. Oysters. Eggs. Dairy: Low-fat milk. Cheese. Greek yogurt. Beverages: Water. Red wine. Herbal tea. Fats and oils: Extra virgin olive oil. Avocado oil. Grape seed oil. Sweets and desserts: [place] yogurt with honey. Baked apples. Poached pears. Trail mix. Seasoning and other foods: Basil. Cilantro. Coriander. Cumin. Mint. Parsley. Sage. Rosemary. Tarragon. Garlic. Oregano. Thyme. Pepper. Balsalmic vinegar. Tahini. Hummus. Tomato sauce. Olives. Mushrooms. Limit these Grains: Prepackaged pasta or rice dishes. Prepackaged cereal with added sugar. Vegetables: Deep fried potatoes (french fries). Fruits: Fruit canned in syrup. Meats and other protein foods: Beef. Pork. Lamb. Poultry with skin. Hot dogs. [place] Salines. Dairy: Ice cream. Sour cream. Whole milk. Beverages: Juice. Sugar-sweetened soft  drinks. Beer. Liquor and spirits. Fats and oils: Butter. Canola oil. Vegetable oil. Beef fat (tallow). Lard. Sweets and desserts: Cookies. Cakes. Pies. Candy. Seasoning and other foods: Mayonnaise. Premade sauces and marinades. The items listed may not be a complete list. Talk with your dietitian about what dietary choices are right for you. Summary The Mediterranean diet includes both food and lifestyle choices. Eat a variety of fresh fruits and vegetables, beans, nuts, seeds, and whole grains. Limit the amount of red meat and sweets that  you eat. Talk with your health care provider about whether it is safe for you to drink red wine in moderation. This means 1 glass a day for nonpregnant women and 2 glasses a day for men. A glass of wine equals 5 oz (150 mL). This information is not intended to replace advice given to you by your health care provider. Make sure you discuss any questions you have with your health care provider. Document Released: 03/19/2016 Document Revised: 04/21/2016 Document Reviewed: 03/19/2016 Elsevier Interactive Patient Education  2017 [employer].

## 2022-10-26 NOTE — Addendum Note (Signed)
Addended by: Renae Gloss on: 10/26/2022 09:52 AM   Modules accepted: Orders

## 2022-10-26 NOTE — Progress Notes (Signed)
Assessment/Plan:   Brian Velez is a very pleasant 57 y.o. 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 MoCA today is 19/30. Prior MRI had shown chronic microvascular changes, moderate for age. He reports that his memory has worsened over the last 4 months. 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 Recommend resuming CPAP use Recommend good control of cardiovascular risk factors  Folllow up in 1 months  Subjective:   The patient is accompanied by his wife who supplements the history.    How long did patient have memory difficulties?  Over the last 2 years, patient has some difficulty remembering recent conversations, directions, and people names. "A lot of fogginess" . 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  Sometimes he cannot recognize people that he knew.  He is concerned because his mother had similar signs in her late 70s.  Over the last 4 months, his symptoms are worse, although he admits that he has increasing level of stress and decreased sleep. Repeats oneself?  Endorsed, "almost immediately ", his wife says.    Disoriented when walking into a room?  Patient denies except occasionally not remembering what  patient came to the room for  Leaving objects in unusual places? Endorsed, he loses phone and keys but not in unusual places   Wandering behavior?  denies   Any personality changes since last visit?  Patient denies   Any history of depression?: Endorsed.  However he reports "It is better now" Hallucinations or paranoia? Endorsed, over the last 6 months he has some hallucinations, seeing dead relatives.  He denies any auditory hallucinations. Seizures?   Patient denies    Any sleep changes?   Endorsed, his wife reports that he talks in his sleep, dreams reality stick dreams, acting out on the a.m., having conversations, and sometimes standing up and walking in his sleep  Sleep apnea?  He does report a history of sleep apnea, but has not used his CPAP in several years. Any hygiene concerns?  Patient denies   Independent of bathing and dressing?  Endorsed  Does the patient needs help with medications?  Wife is in charge  Who is in charge of the finances?  Wife  is in charge for the last few months Any changes in appetite?  Endorsed, decreased  Patient have trouble swallowing? denies   Does the patient cook?  No Any kitchen accidents such as leaving the stove on? denies   Any headaches?   She has a history of migraine headaches on Topamax and Imitrex, the headaches are not often. Ambulates with difficulty?  denies  Has gout, last flare last week, which limits his mobility Recent falls or head injuries? 2 weeks ago fell out of the bed when going tot he bathroom Vision changes? Need glasses, has not seen  an eye doctor in a long time. Unilateral weakness, numbness or tingling?  He has a history of a stroke with right-sided weakness, but the patient states that this weakness is minimal Any tremors?   denies   Any anosmia?  denies   Any incontinence of urine? Sometimes, but none in months Any bowel dysfunction?  He has a history of chronic constipation on Linzess Patient lives  wife and his  son History of heavy alcohol intake? denies   History of heavy tobacco use?  denies   Family history of dementia?  Mother had possibly Alzheimer's disease, and father had dementia with behavioral disturbance of unknown type Does patient drive? Within the last year. Getting lost trying to get home when driving.  He had to pull over going to [facility] and asked for help because he was disoriented.  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      MRI of the brain personally reviewed (02/07/2022) without acute intracranial abnormalities, moderate  chronic cerebral white matter disease likely due to small vessel ischemia.  Past Medical History:  Diagnosis Date   History of gout    Hypertension    MI (myocardial infarction) ([facility]) 2004   Sleep apnea    "don't wear mask anymore"   Stroke ([facility]) 05/27/2014   "right side still weaker" (05/31/2014)     Past Surgical History:  Procedure Laterality Date   ANKLE FRACTURE SURGERY Left 2000's   CARDIAC CATHETERIZATION  2004; 2014   COLONOSCOPY WITH PROPOFOL N/A 12/08/2021   Procedure: COLONOSCOPY WITH PROPOFOL;  Surgeon: Doran Stabler, MD;  Location: [facility];  Service: Gastroenterology;  Laterality: N/A;   CYST REMOVAL LEG Right    CYSTOSCOPY N/A 06/24/2021   Procedure: CYSTOSCOPY;  Surgeon: Remi Haggard, MD;  Location: [facility];  Service: Urology;  Laterality: N/A;   HAND SURGERY     left palm and fingertip injury   ROUX-EN-Y GASTRIC BYPASS  2013   SKIN BIOPSY     TRANSURETHRAL RESECTION OF PROSTATE N/A 06/24/2021   Procedure: TRANSURETHRAL RESECTION OF THE PROSTATE (TURP);  Surgeon: Remi Haggard, MD;  Location: [facility];  Service: Urology;  Laterality: N/A;     No Known Allergies  Current Outpatient Medications  Medication Instructions   allopurinol (ZYLOPRIM) 100 mg, Oral, Daily   AMBULATORY NON FORMULARY MEDICATION Nitroglycerine ointment 0.125 % <BR>Apply a pea sized amount internally  three times daily.<BR>Dispense 30 GM <BR>One refill   amitriptyline (ELAVIL) 150 mg, Oral, Daily at bedtime   colchicine 0.6 MG tablet Take 2 tabs and repeat a tab 1 hr later. Take daily until flare resolves.   gabapentin (NEURONTIN) 300 MG capsule TAKE 2 CAPSULES IN THE MORNING AND AFTERNOON. TAKE 3 IN THE EVENING.   hydrochlorothiazide (HYDRODIURIL) 25 mg, Oral, Daily   linaclotide (LINZESS) 72 mcg, Oral, Daily before breakfast, Lot: [id number]<BR>Exp: 12/23   Multiple Vitamins-Minerals (MULTIVITAMIN WITH MINERALS) tablet 1 tablet, Oral, Daily   olmesartan (BENICAR) 20 mg, Oral, Daily   oxybutynin (DITROPAN XL) 5 mg, Oral, Daily at bedtime   pantoprazole (PROTONIX) 40 mg, Oral, Daily   SUMAtriptan (IMITREX) 100 mg, Oral, Every 2 hours PRN, May repeat in 2 hours if headache persists or recurs.   topiramate (TOPAMAX) 25 MG tablet Take 1 tab/dayfor 3 days then 1 tab 2x/day for 3 days. Add a pill in AM and then PM over 3 day intervals until taking 2 pills 2x/day.  verapamil (CALAN-SR) 120 mg, Oral, 2 times daily     VITALS:   Vitals:   10/26/22 0815  BP: 130/79  Pulse: (!) 111  SpO2: 97%  Weight: 197 lb (89.4 kg)  Height: 5\' 9"  (1.753 m)     PHYSICAL EXAM   HEENT:  Normocephalic, atraumatic. The mucous membranes are moist. The superficial temporal arteries are without ropiness or tenderness. Cardiovascular: Regular rate and rhythm. Lungs: Clear to auscultation bilaterally. Neck: There are no carotid bruits noted bilaterally.  NEUROLOGICAL:    10/26/2022    9:00 AM  Montreal Cognitive Assessment   Visuospatial/ Executive (0/5) 2  Naming (0/3) 3  Attention: Read list of digits (0/2) 2  Attention: Read list of letters (0/1) 1  Attention: Serial 7 subtraction starting at 100 (0/3) 3  Language: Repeat phrase (0/2) 2  Language : Fluency (0/1) 1  Abstraction (0/2) 0  Delayed Recall (0/5) 0  Orientation (0/6) 5  Total 19  Adjusted Score (based on education) 19        No data to  display           Orientation:  Alert and oriented to person, place and year, not to date. No aphasia or dysarthria. Fund of knowledge is appropriate. Recent memory impaired and remote memory intact.  Attention and concentration are reduced.  Able to name objects and repeat phrases. Delayed recall 0/5 Cranial nerves: There is good facial symmetry. Extraocular muscles are intact and visual fields are full to confrontational testing. Speech is fluent and clear. No tongue deviation. Hearing is intact to conversational tone. Tone: Tone is good throughout. Sensation: Sensation is intact to light touch and pinprick throughout. Vibration is intact at the bilateral big toe.There is no extinction with double simultaneous stimulation. There is no sensory dermatomal level identified. Coordination: The patient has no difficulty with RAM's or FNF bilaterally. Normal finger to nose  Motor: Strength is 5/5 in the bilateral upper and lower extremities. There is no pronator drift. There are no fasciculations noted.  Cannot detect right-sided weakness. DTR's: Deep tendon reflexes are 2/4 at the bilateral biceps, triceps, brachioradialis, patella and achilles.  Plantar responses are downgoing bilaterally. Gait and Station: The patient is able to ambulate without difficulty.The patient is able to heel toe walk without any difficulty.The patient is able to ambulate in a tandem fashion. The patient is able to stand in the Romberg position.     Thank you for allowing [place] the opportunity to participate in the care of this nice patient. Please do not hesitate to contact us for any questions or concerns.   Total time spent on today's visit was 60 minutes dedicated to this patient today, preparing to see patient, examining the patient, ordering tests and/or medications and counseling the patient, documenting clinical information in the EHR or other health record, independently interpreting results and communicating results  to the patient/family, discussing treatment and goals, answering patient's questions and coordinating care.  Cc:  Shelda Pal, DO  [facility] 10/26/2022 9:37 AM

## 2022-11-03 ENCOUNTER — Encounter: Payer: Self-pay | Admitting: Family Medicine

## 2022-11-03 ENCOUNTER — Other Ambulatory Visit: Payer: Self-pay | Admitting: Family Medicine

## 2022-11-03 MED ORDER — INDOMETHACIN 50 MG PO CAPS: ORAL_CAPSULE | ORAL | 1 refills | Status: AC

## 2022-11-04 NOTE — Telephone Encounter (Signed)
Office visit is scheduled for 11-24-2022

## 2022-11-13 ENCOUNTER — Institutional Professional Consult (permissible substitution): Payer: BC Managed Care – PPO | Admitting: Plastic Surgery

## 2022-11-18 ENCOUNTER — Encounter: Payer: Self-pay | Admitting: Gastroenterology

## 2022-11-19 ENCOUNTER — Encounter: Payer: Self-pay | Admitting: Physician Assistant

## 2022-11-21 ENCOUNTER — Ambulatory Visit
Admission: RE | Admit: 2022-11-21 | Discharge: 2022-11-21 | Disposition: A | Discharge status: Home / Self Care | Payer: BC Managed Care – PPO | Source: Ambulatory Visit | Attending: Physician Assistant | Admitting: Physician Assistant

## 2022-11-21 DIAGNOSIS — R413 Other amnesia: Secondary | ICD-10-CM

## 2022-11-21 DIAGNOSIS — I771 Stricture of artery: Secondary | ICD-10-CM | POA: Diagnosis not present

## 2022-11-23 ENCOUNTER — Encounter: Payer: Self-pay | Admitting: *Deleted

## 2022-11-25 NOTE — Progress Notes (Signed)
MRI of the brain shows some chronic changes in the circulation, slightly worse than last year, but the volume is stable since the last MRI of the brain, no atrophy is noted.  The volume is appropriate for age.  No acute strokes are noted.  Please make sure that you take care of the blood pressure, cholesterol, and sugars.  Thank you

## 2022-11-26 ENCOUNTER — Encounter: Payer: Self-pay | Admitting: Family Medicine

## 2022-11-30 ENCOUNTER — Ambulatory Visit (INDEPENDENT_AMBULATORY_CARE_PROVIDER_SITE_OTHER): Payer: BC Managed Care – PPO | Admitting: Family Medicine

## 2022-11-30 VITALS — BP 120/88 | HR 92 | Temp 98.2°F | Ht 69.0 in | Wt 194.0 lb

## 2022-11-30 DIAGNOSIS — G4700 Insomnia, unspecified: Secondary | ICD-10-CM

## 2022-11-30 DIAGNOSIS — F339 Major depressive disorder, recurrent, unspecified: Secondary | ICD-10-CM | POA: Diagnosis not present

## 2022-11-30 DIAGNOSIS — R413 Other amnesia: Secondary | ICD-10-CM

## 2022-11-30 DIAGNOSIS — F411 Generalized anxiety disorder: Secondary | ICD-10-CM | POA: Diagnosis not present

## 2022-11-30 MED ORDER — CITALOPRAM HYDROBROMIDE 20 MG PO TABS
ORAL_TABLET | ORAL | 0 refills | Status: DC
Start: 2022-11-30 — End: 2023-02-03

## 2022-11-30 MED ORDER — AMITRIPTYLINE HCL 75 MG PO TABS: 75.0000 mg | ORAL_TABLET | Freq: Every day | ORAL | 1 refills | Status: DC

## 2022-11-30 MED ORDER — SILDENAFIL CITRATE 100 MG PO TABS: 50.0000 mg | ORAL_TABLET | Freq: Every day | ORAL | 3 refills | Status: DC | PRN

## 2022-11-30 MED ORDER — ATORVASTATIN CALCIUM 80 MG PO TABS: 80.0000 mg | ORAL_TABLET | Freq: Every day | ORAL | 3 refills | Status: DC

## 2022-11-30 NOTE — Patient Instructions (Signed)
Keep the diet clean and stay active.  Aim to do some physical exertion for 150 minutes per week. This is typically divided into 5 days per week, 30 minutes per day. The activity should be enough to get your heart rate up. Anything is better than nothing if you have time constraints.  Please consider counseling. Contact [phone] to schedule an appointment or inquire about cost/insurance coverage.  [facility] located at [address], [place], [place].  Phone number = [phone].  Dr. Regan Lemming - Adult Psychiatry.    [facility] located at [address] [place], [place], [place]. Phone number = [phone].   [facility] located at [address] [place], [place].  Phone number = [phone].   [facility] located at [address] [place], [place], [place].  Phone number = [phone].  Let us know if you need anything.

## 2022-11-30 NOTE — Progress Notes (Signed)
Chief Complaint  Patient presents with   Results    MRI    Subjective: Patient is a 57 y.o. male here for f/u.  He is here with his spouse who helps with his history.    He recently saw the neurology team and had an MRI of the brain done.  It did not show anything acute, but it did show advanced age-related changes.  It was thought to be related to small vessel disease.  He stopped his statin several months ago.  He does have a family history of dementia in both of his parents starting in the late 54s.  He has been undergoing a lot of stress and his wife is wondering if this could be contributing.  Anxiety predominates as opposed to depression.  His current memory situation does contribute to feeling down though.  No homicidal or suicidal ideation.  He is not following with a counselor or therapist currently.  No self-medication.  Past Medical History:  Diagnosis Date   History of gout    Hypertension    MI (myocardial infarction) 2004   Sleep apnea    "don't wear mask anymore"   Stroke 05/27/2014   "right side still weaker" (05/31/2014)    Objective: BP 120/88 (BP Location: Left Arm, Patient Position: Sitting, Cuff Size: Normal)   Pulse 92   Temp 98.2 F (36.8 C) (Oral)   Ht  (1.753 m)   Wt 194 lb (88 kg)   SpO2 98%   BMI 28.65 kg/m  General: Awake, appears stated age Lungs: No accessory muscle use Psych:  normal affect and mood  Assessment and Plan: Depression, recurrent - Plan: citalopram (CELEXA) 20 MG tablet  GAD (generalized anxiety disorder) - Plan: citalopram (CELEXA) 20 MG tablet  Memory changes  Insomnia, unspecified type  1/2.  Chronic, unstable.  These could be the reasons why he is struggling with memory.  Start Celexa 10 mg daily for 2 weeks and then increase to 20 mg daily.  We will decrease his Elavil from 150 mg daily to 75 mg daily.  I would like to see him in 5 weeks to recheck. 3.  If his mood improves but his memory does not, would consider  starting Aricept 5 mg daily and titrating up while also considering adding memantine as well. The patient and his spouse voiced understanding and agreement to the plan.  Jilda Roche Wagner, DO 11/30/22  4:42 PM

## 2022-12-14 ENCOUNTER — Ambulatory Visit: Payer: BC Managed Care – PPO | Admitting: Physician Assistant

## 2022-12-15 ENCOUNTER — Ambulatory Visit: Payer: BC Managed Care – PPO | Admitting: Physician Assistant

## 2022-12-18 ENCOUNTER — Ambulatory Visit (INDEPENDENT_AMBULATORY_CARE_PROVIDER_SITE_OTHER): Payer: BC Managed Care – PPO | Admitting: Physician Assistant

## 2022-12-18 ENCOUNTER — Encounter: Payer: Self-pay | Admitting: Physician Assistant

## 2022-12-18 VITALS — BP 139/93 | HR 96 | Ht 69.0 in | Wt 195.0 lb

## 2022-12-18 DIAGNOSIS — G3184 Mild cognitive impairment, so stated: Secondary | ICD-10-CM | POA: Diagnosis not present

## 2022-12-18 NOTE — Progress Notes (Addendum)
Assessment/Plan:   Mild Cognitive Impairment   Brian Velez is a very pleasant 57 y.o. 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 These were personally reviewed, remarkable for age advanced several white matter signal changes, with mild additional progression from MRI performed in 2023 most commonly due to chronic small vessel disease.  No acute findings were noted, volume is normal for age, no atrophy is noted.  Patient is not on antidementia meds. He has an outside appointment with Neuropsychology in 03/2023 for clarity of the diagnosis and to look for other causes of memory loss although vascular etiology is suspected.   This patient is accompanied by his wife who supplements the history.  Previous records as well as any outside records available were reviewed prior to todays visit. He was last seen on 10/26/22 at which time his MoCA was 19/30     Follow up in 4 months Neuropsychological evaluation scheduled for clarity of diagnosis on August 2024 (at outside facility)   Continue to control mood as per PCP, recommend [facility] for situational stress and depression patient is on Celexa 20 mg daily, and Elavil 75 mg daily. Recommend use of CPAP for OSA Recommend good control of cardiovascular risk factors   Will entertain starting on antidementia medication pending on the neuropsychological evaluation results     Initial visit 10/26/22 How long did patient have memory difficulties?  Over the last 2 years, patient has some difficulty remembering recent conversations, directions, and people names. "A lot of fogginess" . 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  Sometimes he cannot recognize people  that he knew.  He is concerned because his mother had similar signs in her late 42s.  Over the last 4 months, his symptoms are worse, although he admits that he has increasing level of stress and decreased sleep. Repeats oneself?  Endorsed, "almost immediately ", his wife says.    Disoriented when walking into a room?  Patient denies except occasionally not remembering what patient came to the room for  Leaving objects in unusual places? Endorsed, he loses phone and keys but not in unusual places   Wandering behavior?  denies   Any personality changes since last visit?  Patient denies   Any history of depression?: Endorsed.  However he reports "It is better now" Hallucinations or paranoia? Endorsed, over the last 6 months he has some hallucinations, seeing dead relatives.  He denies any auditory hallucinations. Seizures?   Patient denies    Any sleep changes?   Endorsed, his wife reports that he talks in his sleep, dreams reality stick dreams, acting out on the a.m., having conversations, and sometimes standing up and walking in his sleep  Sleep apnea?  He does report a history of sleep apnea, but has not used his CPAP in several years. Any hygiene concerns?  Patient denies   Independent of bathing and dressing?  Endorsed  Does the patient needs help with medications?  Wife is in charge  Who is in charge of the finances?  Wife  is in charge for the last few months Any changes in appetite?  Endorsed, decreased  Patient have trouble swallowing? denies   Does the  patient cook?  No Any kitchen accidents such as leaving the stove on? denies   Any headaches?   She has a history of migraine headaches on Topamax and Imitrex, the headaches are not often. Ambulates with difficulty?  denies  Has gout, last flare last week, which limits his mobility Recent falls or head injuries? 2 weeks ago fell out of the bed when going tot he bathroom Vision changes? Need glasses, has not seen an eye doctor in a long  time. Unilateral weakness, numbness or tingling?  He has a history of a stroke with right-sided weakness, but the patient states that this weakness is minimal Any tremors?   denies   Any anosmia?  denies   Any incontinence of urine? Sometimes, but none in months Any bowel dysfunction?  He has a history of chronic constipation on Linzess Patient lives  wife and his son History of heavy alcohol intake? denies   History of heavy tobacco use?  denies   Family history of dementia?  Mother had possibly Alzheimer's disease, and father had dementia with behavioral disturbance of unknown type Does patient drive? Within the last year. Getting lost trying to get home when driving.  He had to pull over going to [facility] and asked for help because he was disoriented.  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        MRI of the brain personally reviewed (02/07/2022) without acute intracranial abnormalities, moderate  chronic cerebral white matter disease likely due to small vessel ischemia.  CURRENT MEDICATIONS:  Outpatient Encounter Medications as of 12/18/2022  Medication Sig   allopurinol (ZYLOPRIM) 100 MG tablet Take 1 tablet (100 mg total) by mouth daily.   AMBULATORY NON FORMULARY MEDICATION Nitroglycerine ointment 0.125 %  Apply a pea sized amount internally three times daily. Dispense 30 GM  One refill   amitriptyline (ELAVIL) 75 MG tablet Take 1 tablet (75 mg total) by mouth at bedtime.   atorvastatin (LIPITOR) 80 MG tablet Take 1 tablet (80 mg total) by mouth daily.   citalopram (CELEXA) 20 MG tablet Take 1/2 tab daily for 2 weeks and then 1 tab daily.   colchicine 0.6 MG tablet Take 2 tabs and repeat a tab 1 hr later. Take daily until flare resolves.   indomethacin (INDOCIN) 50 MG capsule Take 3 times daily as needed for gout flares.   Multiple Vitamins-Minerals (MULTIVITAMIN WITH MINERALS) tablet Take 1 tablet by mouth daily.   oxybutynin (DITROPAN XL)  5 MG 24 hr tablet Take 1 tablet (5 mg total) by mouth at bedtime. (Patient not taking: Reported on 10/26/2022)   pantoprazole (PROTONIX) 40 MG tablet TAKE 1 TABLET BY MOUTH EVERY DAY (Patient not taking: Reported on 10/26/2022)   sildenafil (VIAGRA) 100 MG tablet Take 0.5-1 tablets (50-100 mg total) by mouth daily as needed for erectile dysfunction.   No facility-administered encounter medications on file as of 12/18/2022.        No data to display            10/26/2022    9:00 AM  Montreal Cognitive Assessment   Visuospatial/ Executive (0/5) 2  Naming (0/3) 3  Attention: Read list of digits (0/2) 2  Attention: Read list of letters (0/1) 1  Attention: Serial 7 subtraction starting at 100 (0/3) 3  Language: Repeat phrase (0/2) 2  Language : Fluency (0/1) 1  Abstraction (0/2) 0  Delayed Recall (0/5) 0  Orientation (0/6)  5  Total 19  Adjusted Score (based on education) 19   Thank you for allowing [place] the opportunity to participate in the care of this nice patient. Please do not hesitate to contact us for any questions or concerns.   Total time spent on today's visit was 28 minutes dedicated to this patient today, preparing to see patient, examining the patient, ordering tests and/or medications and counseling the patient, documenting clinical information in the EHR or other health record, independently interpreting results and communicating results to the patient/family, discussing treatment and goals, answering patient's questions and coordinating care.  Cc:  Sharlene Dory, DO  [facility] 12/18/2022 6:33 AM

## 2022-12-18 NOTE — Patient Instructions (Addendum)
It was a pleasure to see you today at our office.   Recommendations:  Follow up in 4 months Neuropsychological evaluation scheduled for clarity of diagnosis on August 2024  Continue to control mood as per PCP, recommend [facility] for situational stress and depression , continue mood medicines  Recommend use of CPAP for OSA Recommend good control of cardiovascular risk factors  Whom to call:  Memory  decline, memory medications: Call our office [phone]   For psychiatric meds, mood meds: Please have your primary care physician manage these medications.   Counseling regarding caregiver distress, including caregiver depression, anxiety and issues regarding community resources, adult day care programs, adult living facilities, or memory care questions:   Feel free to contact Misty Lisabeth Register, Social Worker at [phone]   For assessment of decision of mental capacity and competency:  Call Dr. Erick Blinks, geriatric psychiatrist at [phone]  For guidance in geriatric dementia issues please call [facility] [phone]    If you have any severe symptoms of a stroke, or other severe issues such as confusion,severe chills or fever, etc call 911 or go to the ER as you may need to be evaluated further   Feel free to visit Facebook page " [email or web address]" for tips of how to care for people with memory problems.        RECOMMENDATIONS FOR ALL PATIENTS WITH MEMORY PROBLEMS: 1. Continue to exercise (Recommend 30 minutes of walking everyday, or 3 hours every week) 2. Increase social interactions - continue going to [place] and enjoy social gatherings with friends and family 3. Eat healthy, avoid fried foods and eat more fruits and vegetables 4. Maintain adequate blood pressure, blood sugar, and blood cholesterol level. Reducing the risk of stroke and cardiovascular disease also helps promoting better memory. 5. Avoid stressful situations. Live a simple life and avoid aggravations.  Organize your time and prepare for the next day in anticipation. 6. Sleep well, avoid any interruptions of sleep and avoid any distractions in the bedroom that may interfere with adequate sleep quality 7. Avoid sugar, avoid sweets as there is a strong link between excessive sugar intake, diabetes, and cognitive impairment We discussed the Mediterranean diet, which has been shown to help patients reduce the risk of progressive memory disorders and reduces cardiovascular risk. This includes eating fish, eat fruits and green leafy vegetables, nuts like almonds and hazelnuts, walnuts, and also use olive oil. Avoid fast foods and fried foods as much as possible. Avoid sweets and sugar as sugar use has been linked to worsening of memory function.  There is always a concern of gradual progression of memory problems. If this is the case, then we may need to adjust level of care according to patient needs. Support, both to the patient and caregiver, should then be put into place.      You have been referred for a neuropsychological evaluation (i.e., evaluation of memory and thinking abilities). Please bring someone with you to this appointment if possible, as it is helpful for the doctor to hear from both you and another adult who knows you well. Please bring eyeglasses and hearing aids if you wear them.    The evaluation will take approximately 3 hours and has two parts:   The first part is a clinical interview with the neuropsychologist (Dr. Milbert Coulter or Dr. Roseanne Reno). During the interview, the neuropsychologist will speak with you and the individual you brought to the appointment.    The second part of the evaluation is  testing with the doctor's technician Hinton Dyer or Maudie Mercury). During the testing, the technician will ask you to remember different types of material, solve problems, and answer some questionnaires. Your family member will not be present for this portion of the evaluation.   Please note: We must reserve  several hours of the neuropsychologist's time and the psychometrician's time for your evaluation appointment. As such, there is a No-Show fee of $100. If you are unable to attend any of your appointments, please contact our office as soon as possible to reschedule.    FALL PRECAUTIONS: Be cautious when walking. Scan the area for obstacles that may increase the risk of trips and falls. When getting up in the mornings, sit up at the edge of the bed for a few minutes before getting out of bed. Consider elevating the bed at the head end to avoid drop of blood pressure when getting up. Walk always in a well-lit room (use night lights in the walls). Avoid area rugs or power cords from appliances in the middle of the walkways. Use a walker or a cane if necessary and consider physical therapy for balance exercise. Get your eyesight checked regularly.  FINANCIAL OVERSIGHT: Supervision, especially oversight when making financial decisions or transactions is also recommended.  HOME SAFETY: Consider the safety of the kitchen when operating appliances like stoves, microwave oven, and blender. Consider having supervision and share cooking responsibilities until no longer able to participate in those. Accidents with firearms and other hazards in the house should be identified and addressed as well.   ABILITY TO BE LEFT ALONE: If patient is unable to contact 911 operator, consider using [employer], or when the need is there, arrange for someone to stay with patients. Smoking is a fire hazard, consider supervision or cessation. Risk of wandering should be assessed by caregiver and if detected at any point, supervision and safe proof recommendations should be instituted.  MEDICATION SUPERVISION: Inability to self-administer medication needs to be constantly addressed. Implement a mechanism to ensure safe administration of the medications.   DRIVING: Regarding driving, in patients with progressive memory problems, driving  will be impaired. We advise to have someone else do the driving if trouble finding directions or if minor accidents are reported. Independent driving assessment is available to determine safety of driving.   If you are interested in the driving assessment, you can contact the following:  [employer] in [place]  [place] [phone] or [phone]    [place] refers to food and lifestyle choices that are based on the traditions of countries located on the [employer]. This way of eating has been shown to help prevent certain conditions and improve outcomes for people who have chronic diseases, like kidney disease and heart disease. What are tips for following this plan? Lifestyle  Cook and eat meals together with your family, when possible. Drink enough fluid to keep your urine clear or pale yellow. Be physically active every day. This includes: Aerobic exercise like running or swimming. Leisure activities like gardening, walking, or housework. Get 7-8 hours of sleep each night. If recommended by your health care provider, drink red wine in moderation. This means 1 glass a day for nonpregnant women and 2 glasses a day for men. A glass of wine equals 5 oz (150 mL). Reading food labels  Check the serving size of packaged foods. For foods such as rice and pasta, the  serving size refers to the amount of cooked product, not dry. Check the total fat in packaged foods. Avoid foods that have saturated fat or trans fats. Check the ingredients list for added sugars, such as corn syrup. Shopping  At the grocery store, buy most of your food from the areas near the walls of the store. This includes: Fresh fruits and vegetables (produce). Grains, beans, nuts, and seeds. Some of these may be available in unpackaged forms or large amounts (in  bulk). Fresh seafood. Poultry and eggs. Low-fat dairy products. Buy whole ingredients instead of prepackaged foods. Buy fresh fruits and vegetables in-season from local farmers markets. Buy frozen fruits and vegetables in resealable bags. If you do not have access to quality fresh seafood, buy precooked frozen shrimp or canned fish, such as tuna, salmon, or sardines. Buy small amounts of raw or cooked vegetables, salads, or olives from the deli or salad bar at your store. Stock your pantry so you always have certain foods on hand, such as olive oil, canned tuna, canned tomatoes, rice, pasta, and beans. Cooking  Cook foods with extra-virgin olive oil instead of using butter or other vegetable oils. Have meat as a side dish, and have vegetables or grains as your main dish. This means having meat in small portions or adding small amounts of meat to foods like pasta or stew. Use beans or vegetables instead of meat in common dishes like chili or lasagna. Experiment with different cooking methods. Try roasting or broiling vegetables instead of steaming or sauteing them. Add frozen vegetables to soups, stews, pasta, or rice. Add nuts or seeds for added healthy fat at each meal. You can add these to yogurt, salads, or vegetable dishes. Marinate fish or vegetables using olive oil, lemon juice, garlic, and fresh herbs. Meal planning  Plan to eat 1 vegetarian meal one day each week. Try to work up to 2 vegetarian meals, if possible. Eat seafood 2 or more times a week. Have healthy snacks readily available, such as: Vegetable sticks with hummus. Greek yogurt. Fruit and nut trail mix. Eat balanced meals throughout the week. This includes: Fruit: 2-3 servings a day Vegetables: 4-5 servings a day Low-fat dairy: 2 servings a day Fish, poultry, or lean meat: 1 serving a day Beans and legumes: 2 or more servings a week Nuts and seeds: 1-2 servings a day Whole grains: 6-8 servings a day Extra-virgin  olive oil: 3-4 servings a day Limit red meat and sweets to only a few servings a month What are my food choices? Mediterranean diet Recommended Grains: Whole-grain pasta. Brown rice. Bulgar wheat. Polenta. Couscous. Whole-wheat bread. [place]. Vegetables: Artichokes. Beets. Broccoli. Cabbage. Carrots. Eggplant. Green beans. Chard. Kale. Spinach. Onions. Leeks. Peas. Squash. Tomatoes. Peppers. Radishes. Fruits: Apples. Apricots. Avocado. Berries. Bananas. Cherries. Dates. Figs. Grapes. Lemons. Melon. Oranges. Peaches. Plums. Pomegranate. Meats and other protein foods: Beans. Almonds. Sunflower seeds. Pine nuts. Peanuts. [place]. Salmon. Scallops. Shrimp. [place]. Tilapia. Clams. Oysters. Eggs. Dairy: Low-fat milk. Cheese. Greek yogurt. Beverages: Water. Red wine. Herbal tea. Fats and oils: Extra virgin olive oil. Avocado oil. Grape seed oil. Sweets and desserts: [place] yogurt with honey. Baked apples. Poached pears. Trail mix. Seasoning and other foods: Basil. Cilantro. Coriander. Cumin. Mint. Parsley. Sage. Rosemary. Tarragon. Garlic. Oregano. Thyme. Pepper. Balsalmic vinegar. Tahini. Hummus. Tomato sauce. Olives. Mushrooms. Limit these Grains: Prepackaged pasta or rice dishes. Prepackaged cereal with added sugar. Vegetables: Deep fried potatoes (french fries). Fruits: Fruit canned in syrup. Meats and other protein foods: Beef.  Pork. [place] Asp. Poultry with skin. Hot dogs. [place] Salines. Dairy: Ice cream. Sour cream. Whole milk. Beverages: Juice. Sugar-sweetened soft drinks. Beer. Liquor and spirits. Fats and oils: Butter. Canola oil. Vegetable oil. Beef fat (tallow). Lard. Sweets and desserts: Cookies. Cakes. Pies. Candy. Seasoning and other foods: Mayonnaise. Premade sauces and marinades. The items listed may not be a complete list. Talk with your dietitian about what dietary choices are right for you. Summary The Mediterranean diet includes both food and lifestyle choices. Eat a variety of fresh  fruits and vegetables, beans, nuts, seeds, and whole grains. Limit the amount of red meat and sweets that you eat. Talk with your health care provider about whether it is safe for you to drink red wine in moderation. This means 1 glass a day for nonpregnant women and 2 glasses a day for men. A glass of wine equals 5 oz (150 mL). This information is not intended to replace advice given to you by your health care provider. Make sure you discuss any questions you have with your health care provider. Document Released: 03/19/2016 Document Revised: 04/21/2016 Document Reviewed: 03/19/2016 Elsevier Interactive Patient Education  2017 [employer].

## 2022-12-22 ENCOUNTER — Encounter: Payer: Self-pay | Admitting: Family Medicine

## 2022-12-22 ENCOUNTER — Other Ambulatory Visit: Payer: Self-pay | Admitting: Family Medicine

## 2022-12-22 ENCOUNTER — Encounter: Payer: Self-pay | Admitting: Physician Assistant

## 2022-12-22 DIAGNOSIS — G4733 Obstructive sleep apnea (adult) (pediatric): Secondary | ICD-10-CM

## 2022-12-22 NOTE — Telephone Encounter (Signed)
Pease call this patient, he has a neurocognitive testing, but not in our office, for what  I can see, it is on 9/17 at 9 am with Dr. Kieth Brightly?  We cannot do it at our office because he is under 57 years old. That test id for clarity of diagnosis. Thanks

## 2023-01-28 ENCOUNTER — Encounter: Payer: Self-pay | Admitting: Neurology

## 2023-01-28 ENCOUNTER — Ambulatory Visit (INDEPENDENT_AMBULATORY_CARE_PROVIDER_SITE_OTHER): Payer: BC Managed Care – PPO | Admitting: Neurology

## 2023-01-28 ENCOUNTER — Encounter: Payer: Self-pay | Admitting: Family Medicine

## 2023-01-28 VITALS — BP 138/101 | HR 78 | Ht 69.0 in | Wt 198.0 lb

## 2023-01-28 DIAGNOSIS — G4733 Obstructive sleep apnea (adult) (pediatric): Secondary | ICD-10-CM | POA: Diagnosis not present

## 2023-01-28 DIAGNOSIS — G4719 Other hypersomnia: Secondary | ICD-10-CM

## 2023-01-28 DIAGNOSIS — R6889 Other general symptoms and signs: Secondary | ICD-10-CM | POA: Diagnosis not present

## 2023-01-28 DIAGNOSIS — R4189 Other symptoms and signs involving cognitive functions and awareness: Secondary | ICD-10-CM

## 2023-01-28 NOTE — Progress Notes (Signed)
SLEEP MEDICINE CLINIC    Provider:  Melvyn Novas, MD  Primary Care Physician:  Sharlene Dory, DO [address] [place] [address]     Referring Provider:  Marlowe Kays, [place] / Dr Karel Jarvis, MD          Chief Complaint according to patient   Patient presents with:     New Patient (Initial Visit)     Mild cognitive impairment of uncertain or unknown etiology .Referred today from PCP and Primary Neurologist for Memory at [place] for a Sleep consult. Pt completed a SS >10 + years ago and was told he had sleep apnea. Started CPAP but never really used it because he didn't like it. Presents now because he is having memory concerns and the Neurologist wanted to have sleep evaluated again a       HISTORY OF PRESENT ILLNESS:  Brian Velez is a 57 y.o. male patient who is seen upon referral on 01/28/2023 from [facility] for a sleep consultation.  Chief concern according to patient :    Referred today from PCP and Primary Neurologist for Memory at [place] for a Sleep consult. Pt completed a SS >10 + years ago and was told he had sleep apnea. Started CPAP but never really used it because he didn't like it. Presents now because he is having memory concerns and the Neurologist wanted to have sleep evaluated again .    I have the pleasure of seeing Brian Velez 01/28/23 , a right-handed  AA male with a possible sleep disorder.    The patient had the first sleep study in the year 2012 (?) in  [place], [place] , with a result of obstructive sleep apnea being diagnosed and he was started on CPAP therapy at the time could not really get used to it.   Sleep relevant medical history: Nocturia now 3 times, Sleep walking-reportedly even present now and confirmed by his wife.  His wife also has witnessed apneas and snoring.  She feels that he wakes up because of his frequent irregular breathing spells.  The patient has been treated with amitriptyline to help him sleep however  this can make his cognitive impairment worse. Gout.     Family medical /sleep history: Mother with OSA,  mother was sleep walking. Mother with memory loss, dementia. Bipolar disorder.     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 2 dogs. The patient currently works in daytime, drives from 5.30 AM and until 1 45 PM.  Tobacco use; none .  ETOH use ; none ,  Caffeine intake in form of Coffee( 3 cups a day) Soda( /) Tea ( 4-5 glasses a day) or energy drinks  Sleep habits are as follows: The patient's dinner time is between 4-6 PM. The patient goes to bed at 8.30 PM and continues to sleep for 4 hours wakes for apnea, choking,  bathroom breaks. The preferred sleep position is laterally, with the support of 3 pillows.  Dreams are reportedly frequent/vivid. Every night - fights and kicks in bed and sometimes leaves the bed, has fallen out of bed, too.   The patient wakes up with an alarm. 4/15   AM is the usual rise time. He reports not feeling refreshed or restored in AM, with symptoms such as dry mouth, morning headaches, and residual fatigue.  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    Review of Systems: Out of a complete 14 system review, the patient complains of only the following symptoms, and all other reviewed systems are negative.:  Fatigue, sleepiness , snoring, fragmented sleep, Insomnia, Vivid dreams.  Nocturia.   How likely are you to doze in the following situations: 0 = not likely, 1 = slight chance, 2 = moderate chance, 3 = high chance   Sitting and Reading? Watching Television? Sitting inactive in a public place (theater or meeting)? As a passenger in a car for an hour without a break? Lying down in the afternoon when circumstances permit? Sitting and talking to someone? Sitting quietly after lunch without alcohol? In a car,  while stopped for a few minutes in traffic?   Total = 22/ 24 points  sleeping at the stop light.  FSS endorsed at 58/ 63 points.   Social History   Socioeconomic History   Marital status: Married    Spouse name: Not on file   Number of children: Not on file   Years of education: Not on file   Highest education level: Doctorate  Occupational History   Not on file  Tobacco Use   Smoking status: Never   Smokeless tobacco: Never  Vaping Use   Vaping Use: Never used  Substance and Sexual Activity   Alcohol use: No   Drug use: No   Sexual activity: Never  Other Topics Concern   Not on file  Social History Narrative   [name].   Divorced.   4 children, 1 grandchildren.   Exercise - moderate, goes to the gym   10/2017.   Are you right handed or left handed? right   Are you currently employed ? 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    Who lives with you? Wife and son   What type of home do you live in: 1 story or 2 story? two    Caffeine 2 cups         Right Handed   Social Determinants of Health   Financial Resource Strain: Medium Risk (11/30/2022)   Overall Financial Resource Strain (CARDIA)    Difficulty of Paying Living Expenses: Somewhat hard  Food Insecurity: Food Insecurity Present (11/30/2022)   Hunger Vital Sign    Worried About Running Out of Food in the Last Year: Never true    Ran Out of Food in the Last Year: Sometimes true  Transportation Needs: No Transportation Needs (11/30/2022)   PRAPARE - Administrator, Civil Service (Medical): No    Lack of Transportation (Non-Medical): No  Physical Activity: Unknown (11/30/2022)   Exercise Vital Sign    Days of Exercise per Week: 0 days    Minutes of Exercise per Session: Not on file  Stress: No Stress Concern Present (11/30/2022)   [employer] of Occupational Health - Occupational Stress Questionnaire    Feeling of Stress : Not at all  Social Connections:  Moderately Integrated (11/30/2022)   Social Connection and Isolation Panel [NHANES]    Frequency of Communication with Friends and Family: More than three times a week    Frequency of Social Gatherings with Friends and Family: Patient declined    Attends Religious Services: More than 4 times per year    Active Member of [employer] or Organizations: No    Attends Club or  Organization Meetings: Not on file    Marital Status: Married    Family History  Problem Relation Age of Onset   Dementia Mother        died of dementia   Breast cancer Mother    Hypertension Mother    Heart disease Mother    Cancer Mother        breast   Diabetes Mother    CAD Father    Stroke Father    Prostate cancer Father 87   Hypertension Father    Diabetes Father    Heart disease Father    Colon cancer Father 25   Diabetes type II Sister    Hypertension Sister    Diabetes Sister    Diabetes type II Brother    Hypertension Brother    Diabetes Brother    Colon polyps Brother    Diabetes type II Sister    Hypertension Sister    Diabetes Sister    Diabetes type II Sister    Hypertension Sister    Stroke Brother    Colon cancer Brother    Colon cancer Paternal Uncle    Colon cancer Paternal Uncle    Prostate cancer Paternal Uncle    Esophageal cancer Neg Hx    Rectal cancer Neg Hx    Stomach cancer Neg Hx     Past Medical History:  Diagnosis Date   History of gout    Hypertension    MI (myocardial infarction) (HCC) 2004   Sleep apnea    "don't wear mask anymore"   Stroke (HCC) 05/27/2014   "right side still weaker" (05/31/2014)    Past Surgical History:  Procedure Laterality Date   ANKLE FRACTURE SURGERY Left 2000's   CARDIAC CATHETERIZATION  2004; 2014   COLONOSCOPY WITH PROPOFOL N/A 12/08/2021   Procedure: COLONOSCOPY WITH PROPOFOL;  Surgeon: Sherrilyn Rist, MD;  Location: [facility];  Service: Gastroenterology;  Laterality: N/A;   CYST REMOVAL LEG Right    CYSTOSCOPY N/A  06/24/2021   Procedure: CYSTOSCOPY;  Surgeon: [name], MD;  Location: [facility];  Service: Urology;  Laterality: N/A;   HAND SURGERY     left palm and fingertip injury   ROUX-EN-Y GASTRIC BYPASS  2013   SKIN BIOPSY     TRANSURETHRAL RESECTION OF PROSTATE N/A 06/24/2021   Procedure: TRANSURETHRAL RESECTION OF THE PROSTATE (TURP);  Surgeon: [name], MD;  Location: [facility];  Service: Urology;  Laterality: N/A;     Current Outpatient Medications on File Prior to Visit  Medication Sig Dispense Refill   allopurinol (ZYLOPRIM) 100 MG tablet Take 1 tablet (100 mg total) by mouth daily. 30 tablet 6   AMBULATORY NON FORMULARY MEDICATION Nitroglycerine ointment 0.125 %  Apply a pea sized amount internally three times daily. Dispense 30 GM  One refill 30 g 1   amitriptyline (ELAVIL) 75 MG tablet Take 1 tablet (75 mg total) by mouth at bedtime. 30 tablet 1   atorvastatin (LIPITOR) 80 MG tablet Take 1 tablet (80 mg total) by mouth daily. 90 tablet 3   citalopram (CELEXA) 20 MG tablet Take 1/2 tab daily for 2 weeks and then 1 tab daily. 30 tablet 0   colchicine 0.6 MG tablet Take 2 tabs and repeat a tab 1 hr later. Take daily until flare resolves. 30 tablet 2   indomethacin (INDOCIN) 50 MG capsule Take 3 times daily as needed for gout flares. 30 capsule 1   Multiple Vitamins-Minerals (MULTIVITAMIN WITH  MINERALS) tablet Take 1 tablet by mouth daily.     sildenafil (VIAGRA) 100 MG tablet Take 0.5-1 tablets (50-100 mg total) by mouth daily as needed for erectile dysfunction. 10 tablet 3   No current facility-administered medications on file prior to visit.    No Known Allergies   DIAGNOSTIC DATA (LABS, IMAGING, TESTING) - I reviewed patient records, labs, notes, testing and imaging myself where available.  Lab Results  Component Value Date   WBC 6.7 07/08/2022   HGB 13.6 07/08/2022   HCT 39.7 07/08/2022   MCV 86.4 07/08/2022   PLT 315.0 07/08/2022      Component Value  Date/Time   NA 142 07/08/2022 1008   NA 142 11/19/2017 1016   K 3.8 07/08/2022 1008   CL 106 07/08/2022 1008   CO2 30 07/08/2022 1008   GLUCOSE 57 (L) 07/08/2022 1008   BUN 13 07/08/2022 1008   BUN 11 11/19/2017 1016   CREATININE 1.00 07/08/2022 1008   CALCIUM 8.8 07/08/2022 1008   PROT 6.9 07/08/2022 1008   PROT 6.7 11/19/2017 1016   ALBUMIN 4.1 07/08/2022 1008   ALBUMIN 4.3 11/19/2017 1016   AST 18 07/08/2022 1008   ALT 11 07/08/2022 1008   ALKPHOS 89 07/08/2022 1008   BILITOT 0.9 07/08/2022 1008   BILITOT 1.2 11/19/2017 1016   GFRNONAA >60 06/19/2021 0940   GFRAA >60 03/03/2020 0131   Lab Results  Component Value Date   CHOL 112 12/25/2020   HDL 65.50 12/25/2020   LDLCALC 40 12/25/2020   TRIG 32.0 12/25/2020   CHOLHDL 2 12/25/2020   Lab Results  Component Value Date   HGBA1C 6.2 01/10/2020   Lab Results  Component Value Date   VITAMINB12 206 (L) 07/08/2022   Lab Results  Component Value Date   TSH 1.14 07/08/2022    PHYSICAL EXAM:  Today's Vitals   01/28/23 1543  BP: (!) 138/101  Pulse: 78  Weight: 198 lb (89.8 kg)  Height: 5\' 9"  (1.753 m)   Body mass index is 29.24 kg/m.   Wt Readings from Last 3 Encounters:  01/28/23 198 lb (89.8 kg)  12/18/22 195 lb (88.5 kg)  11/30/22 194 lb (88 kg)     Ht Readings from Last 3 Encounters:  01/28/23 5\' 9"  (1.753 m)  12/18/22 5\' 9"  (1.753 m)  11/30/22 5\' 9"  (1.753 m)      General: The patient is awake, alert and appears not in acute distress. The patient is well groomed. Head: Normocephalic, atraumatic. Neck is supple.  Mallampati 3 plus , pale  neck circumference:17 inches . Nasal airflow  patent.  Retrognathia is not seen.  Dental status: dry mouth, biological.  Cardiovascular:  Regular rate and cardiac rhythm by pulse,  without distended neck veins. Respiratory: Lungs are clear to auscultation.  Skin:  Without evidence of ankle edema, or rash. Trunk: The patient's posture is erect.   NEUROLOGIC  EXAM: The patient is awake and alert, oriented to place and time.   Memory subjective described as intact.     No data to display              10/26/2022    9:00 AM  Montreal Cognitive Assessment   Visuospatial/ Executive (0/5) 2  Naming (0/3) 3  Attention: Read list of digits (0/2) 2  Attention: Read list of letters (0/1) 1  Attention: Serial 7 subtraction starting at 100 (0/3) 3  Language: Repeat phrase (0/2) 2  Language : Fluency (0/1) 1  Abstraction (  0/2) 0  Delayed Recall (0/5) 0  Orientation (0/6) 5  Total 19  Adjusted Score (based on education) 19    Attention span & concentration ability appears normal.  Speech is fluent,  with dysphonia , Mood and affect are depressed   Cranial nerves: no loss of smell or taste reported  Pupils are equal and briskly reactive to light. Funduscopic exam deferred.  Extraocular movements in vertical and horizontal planes were intact and without nystagmus. No Diplopia. Visual fields by finger perimetry are intact. Hearing was intact to soft voice and finger rubbing.    Facial sensation intact to fine touch.  Facial motor strength is symmetric and tongue and uvula move midline.  Neck ROM : rotation, tilt and flexion extension were normal for age and shoulder shrug was symmetrical.    Motor exam:  Symmetric bulk, tone and ROM.   Normal tone without cog wheeling, symmetric grip strength .   Sensory:  Fine touch,and vibration were intact.  Proprioception tested in the upper extremities was normal.   Coordination: Rapid alternating movements in the fingers/hands were of  reduced speed.  TGait and station: stooped stance and posture,  Patient could  barely rise unassisted from a seated position,  but walked without assistive device.  Stance is of normal width/ base and the patient turned with 3 steps.  Toe and heel walk were deferred.  Deep tendon reflexes: in the  upper and lower extremities were attenuated.     ASSESSMENT AND  PLAN 57 y.o. year old male  here with:    1) I have the pleasure of seeing Mr. Brody a May[place] Knife today in the presence of his spouse. 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    He has been referred by [facility] neurology where he is followed work for cognitive impairment, he also shows decreased core strength and strength in extremities, slightly depressed affect, and pallor of the mucous lining which may be related to recent blood loss.   I recommended to start a multi prenatal vitamin   I see him for a sleep evaluation and this evaluation is not just managed to rule out sleep apnea as he has witnessed apneas and snoring but also because this patient has a long history of very vivid dreams that he and acts.  He has fallen out of bed rarely but that has happened, he has been a sleep walker even in adulthood and the vivid dreams are still present almost every night.    I recommend to d/c amitriptyline.  He has dry mouth, and he has cognitive issues.  He started to see a psychiatrist.    In the context of memory loss this may indicate a Lewy body disorder.  For this reason I will order an attended sleep study here at [facility] sleep.  Since the sleep lab has been booked out through the month of August I will start first with sleep apnea evaluation by home sleep test and then when we have apnea identified and treated I would like for him to follow-up with an in lab sleep study for his vivid dreams.   I plan to follow up either personally or through our NP within 5 months.   I would like to thank Marlowe Kays, PA , Dr Karel Jarvis, MD  for allowing me to meet with and to take care of this pleasant patient.     After spending a total time of  45  minutes face to face and additional time for physical and neurologic examination, review of laboratory studies,  personal review of imaging studies, reports and results of other testing and review  of referral information / records as far as provided in visit,   Electronically signed by: Melvyn Novas, MD 01/28/2023 3:49 PM  [facility] and [facility] Sleep Board certified by The [employer] of Sleep Medicine and Diplomate of the [employer] of Sleep Medicine. Board certified In Neurology through the ABPN, Fellow of the [employer] of Neurology. Medical Director of [employer].

## 2023-01-28 NOTE — Patient Instructions (Addendum)
Screening for Sleep Apnea  Sleep apnea is a condition in which breathing pauses or becomes shallow during sleep. Sleep apnea screening is a test to determine if you are at risk for sleep apnea. The test includes a series of questions. It will only takes a few minutes. Your health care provider may ask you to have this test in preparation for surgery or as part of a physical exam. What are the symptoms of sleep apnea? Common symptoms of sleep apnea include: Snoring. Waking up often at night. Daytime sleepiness. Pauses in breathing. Choking or gasping during sleep. Irritability. Forgetfulness. Trouble thinking clearly. Depression. Personality changes. Most people with sleep apnea do not know that they have it. What are the advantages of sleep apnea screening? Getting screened for sleep apnea can help: Ensure your safety. It is important for your health care providers to know whether or not you have sleep apnea, especially if you are having surgery or have other long-term (chronic) health conditions. Improve your health and allow you to get a better night's rest. Restful sleep can help you: Have more energy. Lose weight. Improve high blood pressure. Improve diabetes management. Prevent stroke. Prevent car accidents. What happens during the screening? Screening usually includes being asked a list of questions about your sleep quality. Some questions you may be asked include: Do you snore? Is your sleep restless? Do you have daytime sleepiness? Has a partner or spouse told you that you stop breathing during sleep? Have you had trouble concentrating or memory loss? What is your age? What is your neck circumference? To measure your neck, keep your back straight and gently wrap the tape measure around your neck. Put the tape measure at the middle of your neck, between your chin and collarbone. What is your sex assigned at birth? Do you have or are you being treated for high  blood pressure? If your screening test is positive, you are at risk for the condition. Further testing may be needed to confirm a diagnosis of sleep apnea. Where to find more information You can find screening tools online or at your health care clinic. For more information about sleep apnea screening and healthy sleep, visit these websites: Centers for Disease Control and Prevention: [employer]Ex[employer].br American Sleep Apnea Association: www.sleepapnea.org Contact a health care provider if: You think that you may have sleep apnea. Summary Sleep apnea screening can help determine if you are at risk for sleep apnea. It is important for your health care providers to know whether or not you have sleep apnea, especially if you are having surgery or have other chronic health conditions. You may be asked to take a screening test for sleep apnea in preparation for surgery or as part of a physical exam. This information is not intended to replace advice given to you by your health care provider. Make sure you discuss any questions you have with your health care provider. Document Revised: 07/05/2020 Document Reviewed: 07/05/2020 Elsevier Patient Education  2024 Elsevier Inc. 1) I have the pleasure of seeing Brian Velez today in the presence of his spouse.  He has been referred by [facility] neurology where he is followed work for cognitive impairment, he also shows decreased core strength and strength in extremities,  depressed affect, and pallor of the mucous lining -which may be related to recent blood loss.   I recommended to start a multi prenatal vitamin   I see him for a sleep evaluation and this evaluation is not  just managed to rule out sleep apnea as he has witnessed apneas and snoring , but also because this patient has a long history of very vivid dreams that he enacts.  He has fallen out of bed rarely but that has happened, he has been a sleep walker even in adulthood , and the vivid dreams are  still present almost every night.    I recommend to d/c amitriptyline.  He has dry mouth, and he has cognitive issues.   In the context of memory loss this may indicate a Lewy body disorder.  For this reason I will order an attended sleep study here at [facility] sleep.  Since the sleep lab has been booked out through the month of August I will start first with sleep apnea evaluation by home sleep test and then when we have apnea identified and treated I would like for him to follow-up with an in lab sleep study for his vivid dreams.   I plan to follow up either personally or through our NP within 5 months.   I would like to thank Marlowe Kays, PA , Dr Karel Jarvis, MD  for allowing me to meet with and to take care of this pleasant patient.   Melvyn Novas, MD

## 2023-01-29 ENCOUNTER — Telehealth: Payer: Self-pay

## 2023-01-29 NOTE — Telephone Encounter (Signed)
Initial Comment Caller is calling for her spouse that was taken to the ER by ambulance, he was confused and had a high blood pressure along with chest pain and then he also passed out, all of the tests came back normal, but there was an elevation in his kidney levels, no current symptoms. Translation No Nurse Assessment Nurse: Lalla Brothers, RN, April Date/Time Lamount Cohen Time): 01/28/2023 2:30:41 PM Confirm and document reason for call. If symptomatic, describe symptoms. ---Caller states husband had to be taken to ER via EMS on 6/12 and was instructed to follow up with PCP due to elevated kidney levels. Other testing was normal. No sx at this time and has been sx free for 2 days. Caller states husband is having left flank pressure and urgency, denies urinary pain. Does the patient have any new or worsening symptoms? ---Yes Will a triage be completed? ---Yes Related visit to physician within the last 2 weeks? ---Yes Does the PT have any chronic conditions? (i.e. diabetes, asthma, this includes High risk factors for pregnancy, etc.) ---Yes List chronic conditions. ---HTN, diabetes, elevated kidney levels Is this a behavioral health or substance abuse call? ---No Guidelines Guideline Title Affirmed Question Affirmed Notes Nurse Date/Time Lamount Cohen Time) Urinary Symptoms Side (flank) or lower back pain present Lalla Brothers, RN, April 01/28/2023 2:36:04 PM PLEASE NOTE: All timestamps contained within this report are represented as [place] Standard Time. CONFIDENTIALTY NOTICE: This fax transmission is intended only for the addressee. It contains information that is legally privileged, confidential or otherwise protected from use or disclosure. If you are not the intended recipient, you are strictly prohibited from reviewing, disclosing, copying using or disseminating any of this information or taking any action in reliance on or regarding this information. If you have received this fax in error,  please notify us immediately by telephone so that we can arrange for its return to [place]. Phone: [phone], Toll-Free: [phone], Fax: [phone] Page: 2 of 2 Call Id: [id number] Disp. Time Lamount Cohen Time) Disposition Final User 01/28/2023 2:39:35 PM See PCP within 24 Hours Yes Lalla Brothers, RN, April Final Disposition 01/28/2023 2:39:35 PM See PCP within 24 Hours Yes Lalla Brothers, RN, April Caller Disagree/Comply Comply Caller Understands Yes PreDisposition Call Doctor Care Advice Given Per Guideline SEE PCP WITHIN 24 HOURS: * IF OFFICE WILL BE OPEN: You need to be examined within the next 24 hours. Call your doctor (or NP/PA) when the office opens and make an appointment. PAIN MEDICINES: * For pain relief, you can take either acetaminophen, ibuprofen, or naproxen. DRINK PLENTY OF LIQUIDS: * Drink plenty of liquids. It is important to stay wellhydrated. * A healthy adult should drink 8 cups (240 ml) or more of liquid each day. * How can you tell if you are drinking enough liquids? The goal is to keep the urine clear or light-yellow in color. If your urine is bright yellow or dark yellow, you are probably not drinking enough liquids. CALL BACK IF: * Fever occurs * Unable to urinate and bladder feels full * You become worse CARE ADVICE given per Urinary Symptoms (Adult) guideline. Comments User: April, Lambert, RN Date/Time Lamount Cohen Time): 01/28/2023 2:33:26 PM Caller has an appt for the 25th. User: April, Lambert, RN Date/Time Lamount Cohen Time): 01/28/2023 2:37:29 PM Caller states he has blood in the urine that's come and gone in the last three weeks. User: April, Lambert, RN Date/Time Lamount Cohen Time): 01/28/2023 2:41:31 PM RN advised caller to call ofc and let them of of the 24 hr disposition, per client profile. Caller verbalized  understanding. Referrals REFERRED TO PCP OFFICE

## 2023-01-29 NOTE — Telephone Encounter (Signed)
FYI   Follow up appointment on 02/02/2023

## 2023-02-02 ENCOUNTER — Ambulatory Visit (INDEPENDENT_AMBULATORY_CARE_PROVIDER_SITE_OTHER): Payer: BC Managed Care – PPO | Admitting: Family Medicine

## 2023-02-02 ENCOUNTER — Encounter: Payer: Self-pay | Admitting: Family Medicine

## 2023-02-02 VITALS — BP 108/72 | HR 94 | Temp 98.4°F | Ht 67.0 in | Wt 201.5 lb

## 2023-02-02 DIAGNOSIS — R55 Syncope and collapse: Secondary | ICD-10-CM

## 2023-02-02 DIAGNOSIS — I469 Cardiac arrest, cause unspecified: Secondary | ICD-10-CM | POA: Diagnosis not present

## 2023-02-02 DIAGNOSIS — G4700 Insomnia, unspecified: Secondary | ICD-10-CM | POA: Diagnosis not present

## 2023-02-02 MED ORDER — PRAZOSIN HCL 1 MG PO CAPS: 1.0000 mg | ORAL_CAPSULE | Freq: Every day | ORAL | 1 refills | Status: DC

## 2023-02-02 MED ORDER — HYDROCHLOROTHIAZIDE 25 MG PO TABS: 25.0000 mg | ORAL_TABLET | Freq: Every day | ORAL | 2 refills | Status: DC

## 2023-02-02 MED ORDER — OLMESARTAN MEDOXOMIL 20 MG PO TABS: 20.0000 mg | ORAL_TABLET | Freq: Every day | ORAL | 2 refills | Status: DC

## 2023-02-02 NOTE — Progress Notes (Signed)
Chief Complaint  Patient presents with   Loss of Consciousness    Subjective: Patient is a 57 y.o. male here for f/u. Here w spouse who helps w hx.   On 01/20/2023, the patient and his wife were in [place].  The patient was in a church where he knows lots of people.  He did not recognize any of them.  At 1 point, he felt weak and was leaning his head against his wife's shoulder.  All of a sudden he went limp and his head rolled backwards.  A nurse in the church came over and realize he had no pulse and was not breathing.  She started chest compressions.  He came back after around 4-1/2 minutes.  EMS was called and brought him to the hospital.  Workup was unremarkable and he was diagnosed with vasovagal syncope.  He was not admitted.    He will have intermittent times of lightheadedness and fogginess in his brain.  He had a CT scan in the ER of his brain which was unremarkable.  CTA of the chest did not show any PE or pulmonary disease.  He does not have a cardiologist.  He is eating and drinking normally, though could stand to be more hydrated.  Previous imaging of his brain did show advanced atrophy for his age.  There is a family history of dementia in his mom.   Past Medical History:  Diagnosis Date   History of gout    Hypertension    MI (myocardial infarction) (HCC) 2004   Sleep apnea    "don't wear mask anymore"   Stroke (HCC) 05/27/2014   "right side still weaker" (05/31/2014)    Objective: BP 108/72 (BP Location: Left Arm, Patient Position: Sitting, Cuff Size: Normal)   Pulse 94   Temp 98.4 F (36.9 C) (Oral)   Ht 5\' 7"  (1.702 m)   Wt 201 lb 8 oz (91.4 kg)   SpO2 97%   BMI 31.56 kg/m  General: Awake, appears stated age Heart: RRR, no LE edema, no bruits Lungs: CTAB, no rales, wheezes or rhonchi. No accessory muscle use MSK: TTP over the proximal sternum without deformity or crepitus. Psych: normal affect and mood  Assessment and Plan: Syncope, unspecified syncope  type - Plan: Ambulatory referral to Cardiology, Basic metabolic panel  Cardiac arrest [facility]) - Plan: Ambulatory referral to Cardiology  Insomnia, unspecified type - Plan: hydrochlorothiazide (HYDRODIURIL) 25 MG tablet  Will refer to cardiology on this 1.  Not only for reported cardiac arrest but also for possible long-term heart rate/rhythm monitoring.  He is waiting on CPAP supplies. As above. Chronic, unstable.  Start prazosin 1 mg nightly and follow-up in 1 month. The patient voiced understanding and agreement to the plan.  I spent 43 minutes with patient and his wife discussing the above plans in addition to reviewing his chart on the centimeters  Sharlene Dory, DO 02/02/23  5:18 PM

## 2023-02-02 NOTE — Patient Instructions (Signed)
Give [place] 2-3 business days to get the results of your labs back.   If you do not hear anything about your referral in the next 1-2 weeks, call our office and ask for an update.  Try to drink 55-60 oz of water daily outside of exercise.  Let us know if you need anything.  Please consider counseling. Contact [phone] to schedule an appointment or inquire about cost/insurance coverage.  [facility] located at [address], [place], [place].  Phone number = [phone].  Dr. Regan Lemming - Adult Psychiatry.    [facility] located at [address] [place], [place], [place]. Phone number = [phone].   [facility] located at [address] [place], [place].  Phone number = [phone].   [facility] located at [address] [place], [place], [place].  Phone number = [phone].  [facility] [address] [place], [place] [address] [phone]  [facility] [address] [place], [place] [address] [phone]  [facility] [address] [place], [place] [address] [phone]  [facility] [address], [place], [place], #[phone] [address], [place], [place], #[phone]  [facility] [address] [place], [place] [address] [phone]  [facility] [address] [place], [place] [phone]  [facility] [address] [place], [place] [phone]  [facility] [address] [place], [place]   [phone].   Please go online to complete a form to submit first.  [facility]  [address] [place], [place] [phone].     [facility] -  local practices located at: [address], [place], [place]. [phone]. [address] [place], [place], [place].  [phone]. [address], [place], [place].  [phone].  Contact one of these offices sooner than later as it can take 2-3 months to get a new patient appointment.

## 2023-02-03 ENCOUNTER — Other Ambulatory Visit: Payer: Self-pay | Admitting: Family Medicine

## 2023-02-03 DIAGNOSIS — F411 Generalized anxiety disorder: Secondary | ICD-10-CM

## 2023-02-03 DIAGNOSIS — F339 Major depressive disorder, recurrent, unspecified: Secondary | ICD-10-CM

## 2023-02-03 LAB — BASIC METABOLIC PANEL
BUN: 10 mg/dL (ref 6–23)
CO2: 29 mEq/L (ref 19–32)
Calcium: 8.9 mg/dL (ref 8.4–10.5)
Chloride: 103 mEq/L (ref 96–112)
Creatinine, Ser: 1.25 mg/dL (ref 0.40–1.50)
GFR: 64.47 mL/min (ref >60.00)
Glucose, Bld: 83 mg/dL (ref 70–99)
Potassium: 4.1 mEq/L (ref 3.5–5.1)
Sodium: 139 mEq/L (ref 135–145)

## 2023-02-03 MED ORDER — CITALOPRAM HYDROBROMIDE 20 MG PO TABS
ORAL_TABLET | ORAL | 0 refills | Status: DC
Start: 2023-02-03 — End: 2023-03-18

## 2023-02-04 ENCOUNTER — Encounter: Payer: Self-pay | Admitting: Family Medicine

## 2023-02-23 ENCOUNTER — Telehealth: Payer: Self-pay | Admitting: Neurology

## 2023-02-23 NOTE — Telephone Encounter (Signed)
HST- BC[employer] no auth req via automachine ref # [id number].  NPSG- yes auth req's and I started the case it is pending with [facility] faxed notes.

## 2023-02-24 NOTE — Telephone Encounter (Signed)
[employer] denied the NPSG please see below for reason.

## 2023-02-25 NOTE — Telephone Encounter (Signed)
HST- BCBS no Brian Velez ref # [id number]   Patient was returning your call.

## 2023-03-01 ENCOUNTER — Encounter: Payer: Self-pay | Admitting: Family Medicine

## 2023-03-02 ENCOUNTER — Telehealth: Payer: Self-pay | Admitting: Family Medicine

## 2023-03-02 MED ORDER — DOXEPIN HCL 10 MG PO CAPS: 10.0000 mg | ORAL_CAPSULE | Freq: Every evening | ORAL | 0 refills | Status: DC | PRN

## 2023-03-02 NOTE — Telephone Encounter (Signed)
Pt states the Viagra and sleeping pills are not working at all. He has even taken 2 sleeping pills and it still has not helped.

## 2023-03-02 NOTE — Telephone Encounter (Signed)
Stop med, will send in new one. Sched appt end of next week to reck. Ty .

## 2023-03-02 NOTE — Telephone Encounter (Signed)
Patient informed of PCP instructions. He verbalized understanding. 

## 2023-03-02 NOTE — Telephone Encounter (Signed)
Viagra is not working and needs something sent in for that too Scheduled appt.

## 2023-03-02 NOTE — Telephone Encounter (Signed)
I don't want to change too many things at once and if he sleeps better, it would make sense his erections will improve.

## 2023-03-09 ENCOUNTER — Ambulatory Visit: Payer: BC Managed Care – PPO | Admitting: Neurology

## 2023-03-09 DIAGNOSIS — G4733 Obstructive sleep apnea (adult) (pediatric): Secondary | ICD-10-CM | POA: Diagnosis not present

## 2023-03-09 DIAGNOSIS — R4189 Other symptoms and signs involving cognitive functions and awareness: Secondary | ICD-10-CM

## 2023-03-09 DIAGNOSIS — G4719 Other hypersomnia: Secondary | ICD-10-CM

## 2023-03-09 DIAGNOSIS — R6889 Other general symptoms and signs: Secondary | ICD-10-CM

## 2023-03-10 ENCOUNTER — Ambulatory Visit (INDEPENDENT_AMBULATORY_CARE_PROVIDER_SITE_OTHER): Payer: BC Managed Care – PPO | Admitting: Family Medicine

## 2023-03-10 ENCOUNTER — Encounter: Payer: Self-pay | Admitting: Family Medicine

## 2023-03-10 VITALS — BP 108/64 | HR 70 | Temp 98.1°F | Ht 70.0 in | Wt 199.4 lb

## 2023-03-10 DIAGNOSIS — G4700 Insomnia, unspecified: Secondary | ICD-10-CM

## 2023-03-10 MED ORDER — TADALAFIL 20 MG PO TABS: 10.0000 mg | ORAL_TABLET | ORAL | 1 refills | Status: DC | PRN

## 2023-03-10 MED ORDER — BELSOMRA 20 MG PO TABS
20.0000 mg | ORAL_TABLET | Freq: Every evening | ORAL | 0 refills | Status: AC | PRN
Start: 2023-03-10 — End: ?

## 2023-03-10 NOTE — Patient Instructions (Addendum)
Please schedule a follow up with Dr. Vickey Huger regarding your sleep issues.  Use GoodRx for the tadalafil if too expensive.   For the muscle cramping, drink lots of fluids. Also take a spoonful of pickle juice nightly. An alternative would be a teaspoon of mustard, but most people prefer pickle juice.   Let us know if you need anything.

## 2023-03-10 NOTE — Progress Notes (Signed)
Chief Complaint  Patient presents with   Follow-up    Stays tired all the time    Subjective: Patient is a 57 y.o. male here for f/u.  Patient is following up for chronic insomnia that he has been experiencing for the past 5 to 6 years.  He is currently taking doxepin 10 mg nightly.  He reports no improvement with his symptoms.  He has failed amitriptyline, prazosin and trazodone as well.  He does take Celexa 20 mg daily.  He reports a stable mood.  He is able to fall asleep but wakes up an hour later and is not able to fall back asleep.  He feels fatigued throughout the day.  He has never seen a therapist.  Past Medical History:  Diagnosis Date   History of gout    Hypertension    MI (myocardial infarction) (HCC) 2004   Sleep apnea    "don't wear mask anymore"   Stroke (HCC) 05/27/2014   "right side still weaker" (05/31/2014)    Objective: BP 108/64 (BP Location: Left Arm, Patient Position: Sitting, Cuff Size: Normal)   Pulse 70   Temp 98.1 F (36.7 C) (Oral)   Ht 5\' 10"  (1.778 m)   Wt 199 lb 6 oz (90.4 kg)   SpO2 97%   BMI 28.61 kg/m  General: Awake, appears stated age Lungs: No accessory muscle use Psych: Age appropriate judgment and insight, normal affect and mood  Assessment and Plan: Insomnia, unspecified type - Plan: Suvorexant (BELSOMRA) 20 MG TABS  Chronic, uncontrolled.  Stop doxepin, start Belsomra 20 mg nightly.  Reviewed sleep hygiene.  See BAT resources provided in AVS.  Schedule follow-up appointment with Dr. Vickey Huger of the sleep team.  If he is unable to get in with her at a reasonable interval, I will see him in 2 weeks.  Would consider Seroquel versus mirtazapine.  The patient voiced understanding and agreement to the plan.  Jilda Roche [place], DO 03/10/23  1:51 PM

## 2023-03-16 NOTE — Progress Notes (Signed)
[facility] : Brian Velez SLEEP TEST REPORT ( by Watch PAT)   STUDY DATE:  03-17-2023 DOB:  26-Oct-1965 MRN:    ORDERING CLINICIAN: Melvyn Novas, MD  REFERRING CLINICIAN: Arva Chafe, DO Primary neurologist : [facility], Marlowe Kays, PA   CLINICAL INFORMATION/HISTORY: Main concern today is  excessive daytime sleepiness, Sleep Medicine Consult  referral on 01-28-2023 through [place]. Recent ED visit with syncope was mentioned . 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  He has been referred by [facility] neurology where he is followed work for cognitive impairment Brian Velez underwent a PSG >10 + years ago and was told he had sleep apnea. Started CPAP but quit soon after because he didn't like it. Presents now because he is having memory concerns and the primary Neurologist wanted to have another sleep evaluation.       Epworth sleepiness score: 22/24.  FSS at 58/ 63 points.    BMI:  29.4 kg/m   Neck Circumference: 17"   FINDINGS:   Sleep Summary:   Total Recording Time (hours, min): 8 hours      Total Sleep Time (hours, min):      6 hours 22 minutes           Percent REM (%): About 15%    Sleep latency was 28 minutes, REM sleep latency 118 minutes, there were 70 minutes of wakefulness after sleep onset.                                    Respiratory Indices:   Calculated pAHI (per hour): 13.5/h                            REM pAHI:    14.1/h                                             NREM pAHI: 13.4/h                            Supine AHI:   The patient slept mostly in supine position, associated with an AHI of 14.1/h following AASM criteria.  Nonsupine sleep was associated with an AHI of 8.7/h.  Snoring statistics show a mean volume of 41 dB present for about three quarters of total recorded sleep time.                                                Oxygen  Saturation Statistics:       O2 Saturation Range (%): Between a nadir at 83% of the maximum saturation of 99% with a mean saturation of 94%.                                     O2 Saturation (minutes) <89%:    0.4 minutes       Pulse Rate Statistics:  Pulse Mean (bpm):   69 bpm              Pulse Range:    Between a minimum of 45 bpm and the maximum of 105 bpm           Mild and obstructive sleep apnea that was neither REM sleep dependent nor strictly dependent on sleep position.  Sleep was also not very fragmented. It is possible that the patient's excessive daytime sleepiness is not all explained by this fairly mild sleep apnea.   IMPRESSION:  This HST confirms the presence of mild obstructive sleep apnea . There was no significant hypoxia, brady-tachycardia or sleep fragmentation. His Epworth sleepiness score is far too high to allow him to drive safely.  CPAP is therefor ordered, see below.  The diagnosis of obstructive sleep apnea however allows him to start on modafinil or armodafinil for the treatment of sleepiness and fatigue and daytime.    RECOMMENDATION: This excessively daytime sleepy patient  should be treated with CPAP to have effective data of therapeutic benefit.  This means that the patient will be placed on an auto titration CPAP and followed after a peruiod of 60 to 90 days on therapy -to see if a reduction in apnea and hypopnea does have a positive effect on his daytime sleepiness. Armodafinil medication will be ordered to be taken by mouth at the onset of his workday.    INTERPRETING PHYSICIAN:   Melvyn Novas, MD

## 2023-03-18 ENCOUNTER — Other Ambulatory Visit: Payer: Self-pay | Admitting: Family Medicine

## 2023-03-18 DIAGNOSIS — F411 Generalized anxiety disorder: Secondary | ICD-10-CM

## 2023-03-18 DIAGNOSIS — F339 Major depressive disorder, recurrent, unspecified: Secondary | ICD-10-CM

## 2023-03-22 ENCOUNTER — Telehealth: Payer: Self-pay | Admitting: Gastroenterology

## 2023-03-22 NOTE — Telephone Encounter (Signed)
[facility], in regards to this pt would you happen to know who scheduled his ano rectal manometry at the hospital? I received a msg from the pre service center that the procedure was scheduled with the wrong CPT code format which needs to be 91122. I need to send a msg to the scheduling dept to inform them of this change.

## 2023-03-22 NOTE — Telephone Encounter (Signed)
Brian Velez, CMA scheduled the test originally. However, I have contacted procedure scheduling to ask that CPT be updated to 91122. Spoke with Selena Batten who says this should now reflect update in system.

## 2023-03-23 NOTE — Progress Notes (Signed)
Left a message on voicemail to confirm this appointment for anorectal manometry tomorrow. Have left previous messages on voicemail to cofirm this appointment and have not heard back. Left phone numbers to call if questions or need to cancel.

## 2023-04-04 ENCOUNTER — Other Ambulatory Visit: Payer: Self-pay | Admitting: Family Medicine

## 2023-04-04 ENCOUNTER — Other Ambulatory Visit: Payer: Self-pay | Admitting: Neurology

## 2023-04-04 DIAGNOSIS — G4733 Obstructive sleep apnea (adult) (pediatric): Secondary | ICD-10-CM

## 2023-04-04 DIAGNOSIS — R6889 Other general symptoms and signs: Secondary | ICD-10-CM

## 2023-04-04 DIAGNOSIS — G4719 Other hypersomnia: Secondary | ICD-10-CM

## 2023-04-04 MED ORDER — ARMODAFINIL 200 MG PO TABS: 200.0000 mg | ORAL_TABLET | Freq: Every day | ORAL | 5 refills | Status: DC

## 2023-04-04 NOTE — Procedures (Signed)
[facility] : Brian Velez SLEEP TEST REPORT ( by Watch PAT)   STUDY DATE:  03-17-2023 DOB:  April 21, 1966 MRN:    ORDERING CLINICIAN: Melvyn Novas, MD  REFERRING CLINICIAN: Arva Chafe, DO Primary neurologist : [facility], Marlowe Kays, PA   CLINICAL INFORMATION/HISTORY: Main concern today is  excessive daytime sleepiness, Sleep Medicine Consult  referral on 01-28-2023 through [place]. Recent ED visit with syncope was mentioned . 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 underwent a PSG >10 + years ago and was told he had sleep apnea. Started CPAP but quit soon after because he didn't like it. Presents now because he is having memory concerns and the primary Neurologist wanted to have another sleep evaluation.       Epworth sleepiness score: 22/24.  FSS at 58/ 63 points.    BMI:  29.4 kg/m   Neck Circumference: 17"   FINDINGS:   Sleep Summary:   Total Recording Time (hours, min): 8 hours      Total Sleep Time (hours, min):      6 hours 22 minutes           Percent REM (%): About 15%    Sleep latency was 28 minutes, REM sleep latency 118 minutes, there were 70 minutes of wakefulness after sleep onset.                                    Respiratory Indices:   Calculated pAHI (per hour): 13.5/h                            REM pAHI:    14.1/h                                             NREM pAHI: 13.4/h                            Supine AHI:   The patient slept mostly in supine position, associated with an AHI of 14.1/h following AASM criteria.  Nonsupine sleep was associated with an AHI of 8.7/h.  Snoring statistics show a mean volume of 41 dB present for about three quarters of total recorded sleep time.                                                Oxygen  Saturation Statistics:       O2 Saturation Range (%): Between a nadir at 83% of the maximum saturation of 99% with a mean saturation of 94%.                                     O2 Saturation (minutes) <89%:    0.4 minutes       Pulse Rate Statistics:  Pulse Mean (bpm):   69 bpm              Pulse Range:    Between a minimum of 45 bpm and the maximum of 105 bpm           Mild and obstructive sleep apnea that was neither REM sleep dependent nor strictly dependent on sleep position.  Sleep was also not very fragmented. It is possible that the patient's excessive daytime sleepiness is not all explained by this fairly mild sleep apnea.   IMPRESSION:  This HST confirms the presence of mild obstructive sleep apnea . There was no significant hypoxia, brady-tachycardia or sleep fragmentation. His Epworth sleepiness score is far too high to allow him to drive safely.  CPAP is therefor ordered, see below.  The diagnosis of obstructive sleep apnea however allows him to start on modafinil or armodafinil for the treatment of sleepiness and fatigue and daytime.    RECOMMENDATION: This excessively daytime sleepy patient  should be treated with CPAP to have effective data of therapeutic benefit.  This means that the patient will be placed on an auto titration CPAP and followed after a peruiod of 60 to 90 days on therapy -to see if a reduction in apnea and hypopnea does have a positive effect on his daytime sleepiness. Armodafinil medication will be ordered to be taken by mouth at the onset of his workday.    INTERPRETING PHYSICIAN:   Brian Novas, MD

## 2023-04-04 NOTE — Progress Notes (Signed)
Started on Armodafinil and ordered CPAP auto titration trial for this mutual patient. He will follow up after 60-90 days on CPAP.

## 2023-04-04 NOTE — Progress Notes (Signed)
Please have HLA narcolepsy panel drawn with next visit at [facility] or at [facility].

## 2023-04-05 ENCOUNTER — Telehealth: Payer: Self-pay | Admitting: Neurology

## 2023-04-05 NOTE — Telephone Encounter (Addendum)
Error

## 2023-04-05 NOTE — Telephone Encounter (Signed)
Pt's wife has called, she has questions in response to [employer] from [place], [place] please call pt.

## 2023-04-06 ENCOUNTER — Ambulatory Visit: Payer: BC Managed Care – PPO | Admitting: Cardiovascular Disease

## 2023-04-06 ENCOUNTER — Telehealth: Payer: Self-pay

## 2023-04-13 ENCOUNTER — Encounter: Payer: Self-pay | Admitting: Family Medicine

## 2023-04-20 ENCOUNTER — Ambulatory Visit: Payer: BC Managed Care – PPO | Admitting: Physician Assistant

## 2023-04-27 ENCOUNTER — Encounter: Payer: BC Managed Care – PPO | Admitting: Psychology

## 2023-05-04 ENCOUNTER — Institutional Professional Consult (permissible substitution): Payer: Self-pay | Admitting: Plastic Surgery

## 2023-05-06 ENCOUNTER — Telehealth: Payer: Self-pay | Admitting: Family Medicine

## 2023-05-06 NOTE — Telephone Encounter (Signed)
Pt called to advise that he saw Dr. Carmelia Roller for blood in his rectum but now is urinating blood. Pt is concerned due family history of cancer. Transferred pt to triage to discuss.

## 2023-05-06 NOTE — Telephone Encounter (Signed)
Initial Comment Caller states today he went to the bathroom and he has blood in his urine. He wants to know if he should go to the ER. He does have rectal bleeding from hemorrhoids. Translation No Disp. Time Lamount Cohen Time) Disposition Final User 05/06/2023 12:36:07 PM Attempt made - message left Zena Amos, RNClaris Che 05/06/2023 12:58:17 PM Attempt made - message left Zena Amos, RNClaris Che 05/06/2023 12:58:49 PM Send To RN Personal Zena Amos, RN, Margaret 05/06/2023 1:13:07 PM FINAL ATTEMPT MADE - no message left Yes Shawnie Dapper RN, [employer]line [employer] Final Disposition 05/06/2023 1:13:07 PM FINAL ATTEMPT MADE - no message left Yes Shawnie Dapper, RN, [employer]

## 2023-05-07 NOTE — Telephone Encounter (Signed)
Called pt he agree to be seen on Monday with Dr.Wendling and pt was advised if  Symptoms worsen he need to Urgent care or ER, he stated understand.

## 2023-05-10 ENCOUNTER — Encounter: Payer: Self-pay | Admitting: Family Medicine

## 2023-05-10 ENCOUNTER — Ambulatory Visit (INDEPENDENT_AMBULATORY_CARE_PROVIDER_SITE_OTHER): Payer: PRIVATE HEALTH INSURANCE | Admitting: Family Medicine

## 2023-05-10 VITALS — BP 121/80 | HR 87 | Temp 98.7°F | Ht 70.0 in | Wt 209.0 lb

## 2023-05-10 DIAGNOSIS — F339 Major depressive disorder, recurrent, unspecified: Secondary | ICD-10-CM | POA: Diagnosis not present

## 2023-05-10 DIAGNOSIS — R6882 Decreased libido: Secondary | ICD-10-CM | POA: Diagnosis not present

## 2023-05-10 DIAGNOSIS — N529 Male erectile dysfunction, unspecified: Secondary | ICD-10-CM | POA: Diagnosis not present

## 2023-05-10 DIAGNOSIS — R310 Gross hematuria: Secondary | ICD-10-CM | POA: Diagnosis not present

## 2023-05-10 LAB — POC URINALSYSI DIPSTICK (AUTOMATED)
Glucose, UA: NEGATIVE
Nitrite, UA: NEGATIVE
Protein, UA: POSITIVE — AB
Spec Grav, UA: 1.025 (ref 1.010–1.025)
Urobilinogen, UA: 1 U/dL
pH, UA: 6 (ref 5.0–8.0)

## 2023-05-10 MED ORDER — BUPROPION HCL ER (XL) 150 MG PO TB24
150.0000 mg | ORAL_TABLET | Freq: Every day | ORAL | 0 refills | Status: DC
Start: 2023-05-10 — End: 2023-07-27

## 2023-05-10 MED ORDER — SULFAMETHOXAZOLE-TRIMETHOPRIM 800-160 MG PO TABS
1.0000 | ORAL_TABLET | Freq: Two times a day (BID) | ORAL | 0 refills | Status: AC
Start: 2023-05-10 — End: 2023-05-17

## 2023-05-10 NOTE — Patient Instructions (Addendum)
Give [place] 2-3 business days to get the results of your labs back.   Please get your labs done at our [place] office located on: [address] [place], [place] [address]  You do not need an appointment for that location. Please get them in the morning.   We are stopping the Celexa and starting a new medicine, Wellbutrin.   If you do not hear anything about your referral in the next 1-2 weeks, call our office and ask for an update.  Stay hydrated.   Warning signs/symptoms: Uncontrollable nausea/vomiting, fevers, worsening symptoms despite treatment, confusion.  Give [place] around 2 business days to get culture back to you.  Let us know if you need anything.

## 2023-05-10 NOTE — Progress Notes (Signed)
Chief Complaint  Patient presents with   Hematuria    Subjective: Patient is a 57 y.o. male here for blood in urine.  3 d ago, went to urinate and noted dark red in his urine. Happened again yesterday. Pain in lower abd. Denies fevers, trauma, constipation, or other urinary complaints  Depression- continues to have a depressed mood with insomnia. Failed Elavil, Belsomra, Doxepin, trazodone.  He saw a sleep specialist and was diagnosed with sleep apnea.  Unfortunately, his CPAP was not covered by insurance and he cannot afford the out-of-pocket cost. He is on Celexa 40 mg/d.   Has associated low libido and ED for at least the past 2 months. Failed sildenafil and tadalafil. Has not had T checked.  Mood as above.  Past Medical History:  Diagnosis Date   History of gout    Hypertension    MI (myocardial infarction) (HCC) 2004   Sleep apnea    "don't wear mask anymore"   Stroke (HCC) 05/27/2014   "right side still weaker" (05/31/2014)    Objective: BP 121/80 (BP Location: Right Arm, Patient Position: Sitting, Cuff Size: Normal)   Pulse 87   Temp 98.7 F (37.1 C) (Oral)   Ht 5\' 10"  (1.778 m)   Wt 209 lb (94.8 kg)   SpO2 98%   BMI 29.99 kg/m  General: Awake, appears stated age Heart: RRR, no LE edema Lungs: CTAB, no rales, wheezes or rhonchi. No accessory muscle use Psych: Age appropriate judgment and insight, flat affect  Assessment and Plan: Gross hematuria - Plan: CT ABDOMEN PELVIS W WO CONTRAST, POCT Urinalysis Dipstick (Automated), Urine Culture, Urine Microscopic Only, CBC, sulfamethoxazole-trimethoprim (BACTRIM DS) 800-160 MG tablet, Ambulatory referral to Urology  Depression, recurrent (HCC) - Plan: buPROPion (WELLBUTRIN XL) 150 MG 24 hr tablet  Low libido - Plan: Comprehensive metabolic panel, TSH, Testosterone, buPROPion (WELLBUTRIN XL) 150 MG 24 hr tablet, Ambulatory referral to Urology  Erectile dysfunction, unspecified erectile dysfunction type - Plan: TSH,  Testosterone, buPROPion (WELLBUTRIN XL) 150 MG 24 hr tablet, Ambulatory referral to Urology  UA shows trace blood and LE. Will empirically tx for UTI while awaiting cx. Will place order for CT urogram and referral to urology.  We can cancel the last 2 parts of the workup if urine culture is suggestive of infection. Stop Celexa, start Wellbutrin XL 150 mg daily.  This could be contributing to 3/4.  Follow-up in 1 month to recheck this. Check testosterone and labs. Has failed sildenafil and tadalafil.  Refer to urology.  Will see if the medication change will help as well. The patient voiced understanding and agreement to the plan.  I spent 42 minutes with the patient discussing the above plans in addition to reviewing his chart on the same day of the visit.  Jilda Roche [place], DO 05/10/23  12:20 PM

## 2023-05-11 ENCOUNTER — Telehealth: Payer: Self-pay | Admitting: Family Medicine

## 2023-05-11 NOTE — Telephone Encounter (Signed)
They will hold for 7 days , but think urine went to wrong location/ We ordered only urine culture and urine microscopy only.

## 2023-05-11 NOTE — Telephone Encounter (Signed)
Mary with [facility] called to discuss this patient's cytology orders. Please call her back at [phone] to discuss.

## 2023-05-11 NOTE — Telephone Encounter (Signed)
Spoke with Corrie Dandy at [facility] cytology. Advised specimen was supposed to have been taken to [facility] lab, not cytology. She is going to see if they can get a STAT courier to take it to [facility] today, otherwise it will be tomorrow. She confirmed that specimen has been refrigerated the entire time.

## 2023-05-12 LAB — URINALYSIS, MICROSCOPIC ONLY: RBC / HPF: NONE SEEN (ref 0–?)

## 2023-05-13 LAB — URINE CULTURE
MICRO NUMBER:: 15530145
SPECIMEN QUALITY:: ADEQUATE

## 2023-05-17 NOTE — Telephone Encounter (Signed)
Received a notification that the patient's insurance is not working and they have attempted to call from [facility] to the pt with no response. I will send a mychart message advising.

## 2023-06-04 ENCOUNTER — Ambulatory Visit: Payer: PRIVATE HEALTH INSURANCE | Attending: Cardiovascular Disease | Admitting: Cardiovascular Disease

## 2023-06-07 ENCOUNTER — Encounter: Payer: Self-pay | Admitting: Cardiovascular Disease

## 2023-07-07 ENCOUNTER — Ambulatory Visit (HOSPITAL_COMMUNITY)
Admission: RE | Admit: 2023-07-07 | Discharge: 2023-07-07 | Disposition: A | Discharge status: Home / Self Care | Payer: PRIVATE HEALTH INSURANCE | Attending: Gastroenterology | Admitting: Gastroenterology

## 2023-07-07 ENCOUNTER — Encounter (HOSPITAL_COMMUNITY): Payer: Self-pay | Admitting: Gastroenterology

## 2023-07-07 ENCOUNTER — Encounter (HOSPITAL_COMMUNITY)
Admission: RE | Disposition: A | Discharge status: Home / Self Care | Payer: Self-pay | Source: Home / Self Care | Attending: Gastroenterology

## 2023-07-07 DIAGNOSIS — K648 Other hemorrhoids: Secondary | ICD-10-CM | POA: Diagnosis not present

## 2023-07-07 DIAGNOSIS — K5909 Other constipation: Secondary | ICD-10-CM | POA: Insufficient documentation

## 2023-07-07 DIAGNOSIS — K6289 Other specified diseases of anus and rectum: Secondary | ICD-10-CM | POA: Diagnosis not present

## 2023-07-07 DIAGNOSIS — R159 Full incontinence of feces: Secondary | ICD-10-CM

## 2023-07-07 DIAGNOSIS — K5902 Outlet dysfunction constipation: Secondary | ICD-10-CM

## 2023-07-07 HISTORY — PX: ANAL RECTAL MANOMETRY: SHX6358

## 2023-07-07 SURGERY — MANOMETRY, ANORECTAL

## 2023-07-07 NOTE — Progress Notes (Signed)
Anal rectal manometry performed per protocol without complications.  Patient tolerated well.  Balloon expulsion test performed per protocol without complications.  Patient tolerated well.  Expelled balloon after 70 seconds.

## 2023-07-09 ENCOUNTER — Encounter (HOSPITAL_COMMUNITY): Payer: Self-pay | Admitting: Gastroenterology

## 2023-07-27 ENCOUNTER — Other Ambulatory Visit: Payer: Self-pay | Admitting: Family Medicine

## 2023-07-27 ENCOUNTER — Telehealth: Payer: Self-pay

## 2023-07-27 DIAGNOSIS — R6882 Decreased libido: Secondary | ICD-10-CM

## 2023-07-27 DIAGNOSIS — N529 Male erectile dysfunction, unspecified: Secondary | ICD-10-CM

## 2023-07-27 DIAGNOSIS — F339 Major depressive disorder, recurrent, unspecified: Secondary | ICD-10-CM

## 2023-07-27 MED ORDER — BUPROPION HCL ER (XL) 150 MG PO TB24
150.0000 mg | ORAL_TABLET | Freq: Every day | ORAL | 2 refills | Status: DC
Start: 2023-07-27 — End: 2024-02-07

## 2023-07-27 NOTE — Telephone Encounter (Signed)
Called pt Brian Velez per Dr.Wendling we can work him in tomorrow? Or is he ok to wait till his appt on 12.24.24?

## 2023-08-03 ENCOUNTER — Encounter: Payer: Self-pay | Admitting: Family Medicine

## 2023-08-03 ENCOUNTER — Ambulatory Visit: Payer: PRIVATE HEALTH INSURANCE | Admitting: Family Medicine

## 2023-08-03 VITALS — BP 122/78 | HR 83 | Temp 98.0°F | Resp 16 | Ht 70.0 in | Wt 209.0 lb

## 2023-08-03 DIAGNOSIS — R531 Weakness: Secondary | ICD-10-CM | POA: Diagnosis not present

## 2023-08-03 DIAGNOSIS — M542 Cervicalgia: Secondary | ICD-10-CM | POA: Diagnosis not present

## 2023-08-03 DIAGNOSIS — M5416 Radiculopathy, lumbar region: Secondary | ICD-10-CM

## 2023-08-03 DIAGNOSIS — R292 Abnormal reflex: Secondary | ICD-10-CM | POA: Diagnosis not present

## 2023-08-03 DIAGNOSIS — K5900 Constipation, unspecified: Secondary | ICD-10-CM

## 2023-08-03 DIAGNOSIS — G4700 Insomnia, unspecified: Secondary | ICD-10-CM

## 2023-08-03 MED ORDER — METHYLPREDNISOLONE 4 MG PO TBPK
ORAL_TABLET | ORAL | 0 refills | Status: DC
Start: 2023-08-03 — End: 2023-08-23

## 2023-08-03 MED ORDER — PRAZOSIN HCL 2 MG PO CAPS
2.0000 mg | ORAL_CAPSULE | Freq: Every day | ORAL | 1 refills | Status: DC
Start: 2023-08-03 — End: 2023-08-23

## 2023-08-03 NOTE — Progress Notes (Signed)
Chief Complaint  Patient presents with   Abdominal Pain    Abdominal pain    Brian Velez is here for b/l lower abdominal pain.  Duration: 3 weeks Comes and goes.  Nighttime awakenings? No Bleeding? Nothing new Weight loss? No Palliation:  Provocation: Associated symptoms: N/V, dizziness,.  Denies: fever, trauma Treatment to date: laxatives  B/l leg pain described as cramping/sharp. Started around 2 mo ago. Tylenol does not help. Wakes him up at night. No bruising, redness, swelling. Getting worse. Massage and getting off his feet are the only relief that he has. Hx of low back procedure that did help with his pain. [facility] completed the procedure. He has left upper/lower ext weakness and has had falls because of it. L sided neck pain as well.   Insomnia Hx of insomnia. Currently not on any meds. Above issues contribute. Did OK with Elavil. Failed trazodone and gabapentin. Would like something else. Not following w a therapist.   Past Medical History:  Diagnosis Date   History of gout    Hypertension    MI (myocardial infarction) (HCC) 2004   Sleep apnea    "don't wear mask anymore"   Stroke (HCC) 05/27/2014   "right side still weaker" (05/31/2014)    BP 122/78   Pulse 83   Temp 98 F (36.7 C) (Oral)   Resp 16   Ht 5\' 10"  (1.778 m)   Wt 209 lb (94.8 kg)   SpO2 98%   BMI 29.99 kg/m  Gen.: Awake, alert, appears stated age HEENT: Mucous membranes moist without mucosal lesions Heart: Regular rate and rhythm without murmurs Lungs: Clear auscultation bilaterally, no rales or wheezing, normal effort without accessory muscle use. Abdomen: Bowel sounds are present. Abdomen is soft, ttp in LLQ, nondistended, no masses or organomegaly. Negative Murphy's, Rovsing's, McBurney's, and Carnett's sign. Neuro: 5/5 strength on R upper/lower ext. 4/5 strength throughout L upper/lower ext; 2/4 patellar reflex on R and biceps reflex 1/4 b/l, 1/4 L patellar reflex.  No clonus. Gait is cautious.  Psych: Age appropriate judgment and insight. Normal mood and affect.  Lumbar radiculopathy - Plan: methylPREDNISolone (MEDROL DOSEPAK) 4 MG TBPK tablet  Neck pain - Plan: MR Cervical Spine Wo Contrast  Left-sided weakness - Plan: MR Cervical Spine Wo Contrast  Asymmetrical deep tendon reflexes - Plan: MR Cervical Spine Wo Contrast  Cervicalgia - Plan: MR Cervical Spine Wo Contrast  Insomnia, unspecified type - Plan: prazosin (MINIPRESS) 2 MG capsule  Constipation, unspecified constipation type  1-5. Concern for cervical myelopathy. I do want him to reach out to Dr. Wynetta Emery for an appt. Given upper ext involvement, doubt lumbar MRI required, will ck cerv MRI. Medrol Dosepak for acute relief. If worsening, go to ER.  5. Chronic, unstable. Start prazosin 2 mg qhs. F/u in 1 mo.  6. Seems to be constipation. Stay hydrated, consider fiber supp; cocktail of milk of mg + prune juice followed by prn suppository rec'd.  Pt voiced understanding and agreement to the plan.  I spent 45 min w the pt discussing the above plans in addition to reviewing his chart on the same day of the visit.   Jilda Roche [place], DO 08/03/23 1:41 PM

## 2023-08-03 NOTE — Patient Instructions (Addendum)
Please contact your neurosurgeon at:   Sleep Hygiene Tips: Do not watch TV or look at screens within 1 hour of going to bed. If you do, make sure there is a blue light filter (nighttime mode) involved. Try to go to bed around the same time every night. Wake up at the same time within 1 hour of regular time. Ex: If you wake up at 7 AM for work, do not sleep past 8 AM on days that you don't work. Do not drink alcohol before bedtime. Do not consume caffeine-containing beverages after noon or within 9 hours of intended bedtime. Get regular exercise/physical activity in your life, but not within 2 hours of planned bedtime. Do not take naps.  Do not eat within 2 hours of planned bedtime. Melatonin, 3-5 mg 30-60 minutes before planned bedtime may be helpful.  The bed should be for sleep or sex only. If after 20-30 minutes you are unable to fall asleep, get up and do something relaxing. Do this until you feel ready to go to sleep again.   Sleep is important to [place] all. Getting good sleep is imperative to adequate functioning during the day. Work with our counselors who are trained to help people obtain quality sleep. Call [phone] to schedule an appointment or if you are curious about insurance coverage/cost.  Try to drink 55-60 oz of water daily outside of exercise.  Take Metamucil or Benefiber daily.  Try 2 tablespoons of milk of mag in 4 oz of warm prune juice. Do that and wait a couple hours. If no improvement, try a Dulcolax suppository and then let me know if we are still having issues.   Let us know if you need anything.

## 2023-08-10 DIAGNOSIS — K5902 Outlet dysfunction constipation: Secondary | ICD-10-CM

## 2023-08-12 ENCOUNTER — Ambulatory Visit (HOSPITAL_COMMUNITY)
Admission: RE | Admit: 2023-08-12 | Discharge: 2023-08-12 | Disposition: A | Discharge status: Home / Self Care | Payer: PRIVATE HEALTH INSURANCE | Source: Ambulatory Visit | Attending: Family Medicine | Admitting: Family Medicine

## 2023-08-12 DIAGNOSIS — R292 Abnormal reflex: Secondary | ICD-10-CM | POA: Insufficient documentation

## 2023-08-12 DIAGNOSIS — R531 Weakness: Secondary | ICD-10-CM | POA: Insufficient documentation

## 2023-08-12 DIAGNOSIS — M542 Cervicalgia: Secondary | ICD-10-CM | POA: Insufficient documentation

## 2023-08-17 ENCOUNTER — Telehealth: Payer: Self-pay

## 2023-08-17 ENCOUNTER — Telehealth: Payer: Self-pay | Admitting: Family Medicine

## 2023-08-17 DIAGNOSIS — K5902 Outlet dysfunction constipation: Secondary | ICD-10-CM

## 2023-08-17 NOTE — Telephone Encounter (Signed)
 Called pt and sent mychart message with phone  for [facility] per  Dr. Carmelia Roller wanting him to follow up with them.

## 2023-08-17 NOTE — Telephone Encounter (Signed)
 Called pt and sent message per Dr.Wendling if he  if the he got in with his neurosurgeon? Give our office  a call  let us know.

## 2023-08-17 NOTE — Telephone Encounter (Signed)
 Message sent to pt.

## 2023-08-17 NOTE — Telephone Encounter (Signed)
 Copied from CRM [id number]. Topic: Referral - Request for Referral >> Aug 17, 2023  3:16 PM Viola F wrote: Did the patient discuss referral with their provider in the last year? Yes (If No - schedule appointment) (If Yes - send message)  Appointment offered? No  Type of order/referral and detailed reason for visit: [facility]uerosurgeon (Anyone but Dr. Onetha- please do not refer this Doctor per pt request)  Preference of office, provider, location: N/A  If referral order, have you been seen by this specialty before? Yes, please call pt regarding referral  (If Yes, this issue or another issue? When? Where?  Can we respond through MyChart? Yes

## 2023-08-18 ENCOUNTER — Other Ambulatory Visit: Payer: Self-pay

## 2023-08-18 ENCOUNTER — Telehealth: Payer: Self-pay

## 2023-08-18 DIAGNOSIS — M5416 Radiculopathy, lumbar region: Secondary | ICD-10-CM

## 2023-08-18 NOTE — Telephone Encounter (Signed)
 Please place external referral to neurosurgery, dx lumbar radiculopathy, lower extremity weakness, priority: ASAP. I want him to reach out to his NS's office to see if he can get in sooner and if he can see someone else. Ty.

## 2023-08-18 NOTE — Telephone Encounter (Signed)
 Message sent to provider

## 2023-08-18 NOTE — Telephone Encounter (Signed)
 Referral sent and called pt was advised of new referral, also advised pt per providers order to reach out to his neurosurgery, to see if they can get him in while we are wait for the new one. Pt was advised to send a message to let us  know if they can get him in sooner. Pt stated he understand.

## 2023-08-19 NOTE — Therapy (Deleted)
 OUTPATIENT PHYSICAL THERAPY MALE PELVIC EVALUATION   Patient Name: Brian Velez MRN: [id number] DOB:[birth year], 58 y.o., male Today's Date: 08/19/2023  END OF SESSION:   Past Medical History:  Diagnosis Date   History of gout    Hypertension    MI (myocardial infarction) (HCC) 2004   Sleep apnea    don't wear mask anymore   Stroke (HCC) 05/27/2014   right side still weaker (05/31/2014)   Past Surgical History:  Procedure Laterality Date   ANAL RECTAL MANOMETRY N/A 07/07/2023   Procedure: ANO RECTAL MANOMETRY;  Surgeon: Legrand Victory LITTIE DOUGLAS, MD;  Location: [facility];  Service: Gastroenterology;  Laterality: N/A;   ANKLE FRACTURE SURGERY Left 2000's   CARDIAC CATHETERIZATION  2004; 2014   COLONOSCOPY WITH PROPOFOL  N/A 12/08/2021   Procedure: COLONOSCOPY WITH PROPOFOL ;  Surgeon: Legrand Victory LITTIE DOUGLAS, MD;  Location: [facility];  Service: Gastroenterology;  Laterality: N/A;   CYST REMOVAL LEG Right    CYSTOSCOPY N/A 06/24/2021   Procedure: CYSTOSCOPY;  Surgeon: Rosalind Zachary NOVAK, MD;  Location: [facility];  Service: Urology;  Laterality: N/A;   HAND SURGERY     left palm and fingertip injury   ROUX-EN-Y GASTRIC BYPASS  2013   SKIN BIOPSY     TRANSURETHRAL RESECTION OF PROSTATE N/A 06/24/2021   Procedure: TRANSURETHRAL RESECTION OF THE PROSTATE (TURP);  Surgeon: Rosalind Zachary NOVAK, MD;  Location: [facility];  Service: Urology;  Laterality: N/A;   Patient Active Problem List   Diagnosis Date Noted   Constipation due to outlet dysfunction 08/10/2023   Vivid dream 01/28/2023   Excessive daytime sleepiness 01/28/2023   OSA (obstructive sleep apnea) 01/28/2023   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   Mild cognitive impairment of uncertain or unknown etiology 12/18/2022   Depression, recurrent (HCC) 11/30/2022   GAD (generalized anxiety disorder) 11/30/2022   Cluster headache, not intractable 04/17/2022   Chronic daily headache 02/11/2022   Mixed incontinence urge and stress 09/30/2021   BPH  (benign prostatic hyperplasia) 06/24/2021   Cervical radiculopathy 05/27/2020   Lumbar radiculopathy 05/27/2020   Chronic gout involving toe of left foot without tophus 10/28/2018   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/15/2017   Hyperlipidemia 12/10/2017   Neck pain 12/10/2017   Radicular pain in right arm 12/10/2017   Dyspnea on exertion 12/10/2017   History of myocardial infarction 11/19/2017   History of stroke 11/19/2017   Heart disease 11/19/2017   History of gout 11/19/2017   Vaccine counseling 11/19/2017   Need for Tdap vaccination 11/19/2017   Insomnia 11/19/2017   Essential hypertension 10/19/2017   Family history of hypertension 10/19/2017   Family history of prostate cancer 10/19/2017   Family history of colon cancer 10/19/2017   Family history of diabetes mellitus 10/19/2017   Encounter for hepatitis C screening test for low risk patient 10/19/2017   Ulnar nerve impingement 06/01/2014   Atypical chest pain 05/31/2014    PCP: Frann Mabel Mt, DO  REFERRING PROVIDER: Legrand Victory LITTIE DOUGLAS, MD   REFERRING DIAG: 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  Rationale for Evaluation and Treatment: Rehabilitation  ONSET DATE: ***  SUBJECTIVE:  SUBJECTIVE STATEMENT: *** Fluid intake: ***  PAIN:  Are you having pain? {yes/no:20286} NPRS scale: ***/10 Pain location: {pelvic pain location:27098}  Pain type: {type:313116} Pain description: {PAIN DESCRIPTION:21022940}   Aggravating factors: *** Relieving factors: ***  PRECAUTIONS: {Therapy precautions:24002}  RED FLAGS: {PT Red Flags:29287}   WEIGHT BEARING RESTRICTIONS: No  FALLS:  Has patient fallen in last 6 months?  {fallsyesno:27318}  LIVING ENVIRONMENT: Lives with: {OPRC lives with:25569::lives with their family} Lives in: {Lives in:25570} Stairs: {opstairs:27293} Has following equipment at home: {Assistive devices:23999}  OCCUPATION: ***  PLOF: {PLOF:24004}  PATIENT GOALS: ***  PERTINENT HISTORY:  Stroke 2015; MI; Roux-En-Y Gastric Bypass 2013; Transurethral Resection of prostate 2022 Sexual abuse: {Yes/No}  BOWEL MOVEMENT: Pain with bowel movement: {yes/no:20286} Type of bowel movement:{PT BM type:27100} Fully empty rectum: {Yes/No:304960894} Leakage: {Yes/No:304960894} Pads: {Yes/No:304960894} Fiber supplement: {Yes/No:304960894}  URINATION: Pain with urination: {yes/no:20286} Fully empty bladder: {Yes/No:304960894} Stream: {PT urination:27102} Urgency: {Yes/No:304960894} Frequency: *** Leakage: {PT leakage:27103} Pads: {Yes/No:304960894}  INTERCOURSE: Pain with intercourse: {pain with intercourse PA:27099} Climax: *** Ejaculation: {Yes/No:304960894}   OBJECTIVE:  Note: Objective measures were completed at Evaluation unless otherwise noted.  DIAGNOSTIC FINDINGS:  ***  PATIENT SURVEYS:  {rehab surveys:24030}  PFIQ-7 ***  COGNITION: Overall cognitive status: {cognition:24006}     SENSATION: Light touch: {intact/deficits:24005} Proprioception: {intact/deficits:24005}  MUSCLE LENGTH: Hamstrings: Right *** deg; Left *** deg Thomas test: Right *** deg; Left *** deg  LUMBAR SPECIAL TESTS:  {lumbar special test:25242}  FUNCTIONAL TESTS:  {Functional tests:24029}  GAIT: Distance walked: *** Assistive device utilized: {Assistive devices:23999} Level of assistance: {Levels of assistance:24026} Comments: ***  POSTURE: {posture:25561}  PELVIC ALIGNMENT:  LUMBARAROM/PROM:  A/PROM A/PROM  eval  Flexion   Extension   Right lateral flexion   Left lateral flexion   Right rotation   Left rotation    (Blank rows = not tested)  LOWER EXTREMITY  AROM/PROM:  A/PROM Right eval Left eval  Hip flexion    Hip extension    Hip abduction    Hip adduction    Hip internal rotation    Hip external rotation    Knee flexion    Knee extension    Ankle dorsiflexion    Ankle plantarflexion    Ankle inversion    Ankle eversion     (Blank rows = not tested)  LOWER EXTREMITY MMT:  MMT Right eval Left eval  Hip flexion    Hip extension    Hip abduction    Hip adduction    Hip internal rotation    Hip external rotation    Knee flexion    Knee extension    Ankle dorsiflexion    Ankle plantarflexion    Ankle inversion    Ankle eversion     PALPATION: GENERAL ***              External Perineal Exam ***              Internal Pelvic Floor *** Patient confirms identification and approves PT to assess internal pelvic floor and treatment {yes/no:20286}  PELVIC MMT:   MMT eval  Internal Anal Sphincter   External Anal Sphincter   Puborectalis   Diastasis Recti   (Blank rows = not tested)  TONE: ***  TODAY'S TREATMENT:  DATE: ***  EVAL ***   PATIENT EDUCATION:  Education details: *** Person educated: {Person educated:25204} Education method: {Education Method:25205} Education comprehension: {Education Comprehension:25206}  HOME EXERCISE PROGRAM: ***  ASSESSMENT:  CLINICAL IMPRESSION: Patient is a *** y.o. *** who was seen today for physical therapy evaluation and treatment for ***.   OBJECTIVE IMPAIRMENTS: {opptimpairments:25111}.   ACTIVITY LIMITATIONS: {activitylimitations:27494}  PARTICIPATION LIMITATIONS: {participationrestrictions:25113}  PERSONAL FACTORS: {Personal factors:25162} are also affecting patient's functional outcome.   REHAB POTENTIAL: {rehabpotential:25112}  CLINICAL DECISION MAKING: {clinical decision making:25114}  EVALUATION COMPLEXITY: {Evaluation  complexity:25115}   GOALS: Goals reviewed with patient? {yes/no:20286}  SHORT TERM GOALS: Target date: ***  *** Baseline: Goal status: INITIAL  2.  *** Baseline:  Goal status: INITIAL  3.  *** Baseline:  Goal status: INITIAL  4.  *** Baseline:  Goal status: INITIAL  5.  *** Baseline:  Goal status: INITIAL  6.  *** Baseline:  Goal status: INITIAL  LONG TERM GOALS: Target date: ***  *** Baseline:  Goal status: INITIAL  2.  *** Baseline:  Goal status: INITIAL  3.  *** Baseline:  Goal status: INITIAL  4.  *** Baseline:  Goal status: INITIAL  5.  *** Baseline:  Goal status: INITIAL  6.  *** Baseline:  Goal status: INITIAL   PLAN:  PT FREQUENCY: {rehab frequency:25116}  PT DURATION: {rehab duration:25117}  PLANNED INTERVENTIONS: {rehab planned interventions:25118::97110-Therapeutic exercises,97530- Therapeutic [phone]- Neuromuscular re-education,97535- Self Rjmz,02859- Manual therapy}  PLAN FOR NEXT SESSION: ***   Josemaria Brining, PT 08/19/2023, 3:20 PM

## 2023-08-20 ENCOUNTER — Ambulatory Visit: Payer: PRIVATE HEALTH INSURANCE | Admitting: Physical Therapy

## 2023-08-23 ENCOUNTER — Encounter: Payer: Self-pay | Admitting: Family Medicine

## 2023-08-23 ENCOUNTER — Other Ambulatory Visit: Payer: Self-pay | Admitting: Family Medicine

## 2023-08-23 MED ORDER — DOXEPIN HCL 10 MG/ML PO CONC: 6.0000 mg | Freq: Every day | ORAL | 1 refills | Status: DC

## 2023-08-26 ENCOUNTER — Other Ambulatory Visit: Payer: Self-pay | Admitting: Family Medicine

## 2023-08-26 DIAGNOSIS — G4700 Insomnia, unspecified: Secondary | ICD-10-CM

## 2023-08-27 NOTE — Telephone Encounter (Signed)
Called pt pharmacy and they stated the pt received 30 day supply which was 120 ml. Pt  has refills at the pharmacy.  doxepin (SINEQUAN) 10 MG/ML solution Take 0.6 mLs (6 mg total) by mouth at bedtime. Dispense: 120 mL, Refills: 1 ordere

## 2023-10-07 ENCOUNTER — Other Ambulatory Visit: Payer: Self-pay | Admitting: Family Medicine

## 2023-10-07 DIAGNOSIS — G4700 Insomnia, unspecified: Secondary | ICD-10-CM
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
## 2023-11-03 ENCOUNTER — Ambulatory Visit: Payer: PRIVATE HEALTH INSURANCE | Attending: Gastroenterology | Admitting: Physical Therapy

## 2023-11-03 ENCOUNTER — Telehealth: Payer: Self-pay | Admitting: Physical Therapy

## 2023-11-03 NOTE — Telephone Encounter (Signed)
 Called patient and left a message to call back. Called about missed evaluation today at 10:15.  Eulis Foster, PT @3 /26/25@ 10:31 AM

## 2023-11-12 ENCOUNTER — Ambulatory Visit: Payer: PRIVATE HEALTH INSURANCE | Admitting: Family Medicine

## 2023-11-13 ENCOUNTER — Emergency Department (HOSPITAL_BASED_OUTPATIENT_CLINIC_OR_DEPARTMENT_OTHER): Payer: PRIVATE HEALTH INSURANCE | Admitting: Radiology

## 2023-11-13 ENCOUNTER — Other Ambulatory Visit: Payer: Self-pay

## 2023-11-13 ENCOUNTER — Encounter (HOSPITAL_BASED_OUTPATIENT_CLINIC_OR_DEPARTMENT_OTHER): Payer: Self-pay

## 2023-11-13 ENCOUNTER — Emergency Department (HOSPITAL_BASED_OUTPATIENT_CLINIC_OR_DEPARTMENT_OTHER)
Admission: EM | Admit: 2023-11-13 | Discharge: 2023-11-14 | Disposition: A | Discharge status: Home / Self Care | Payer: PRIVATE HEALTH INSURANCE | Attending: Emergency Medicine | Admitting: Emergency Medicine

## 2023-11-13 DIAGNOSIS — M62830 Muscle spasm of back: Secondary | ICD-10-CM | POA: Diagnosis present

## 2023-11-13 DIAGNOSIS — I10 Essential (primary) hypertension: Secondary | ICD-10-CM | POA: Diagnosis not present

## 2023-11-13 DIAGNOSIS — Z79899 Other long term (current) drug therapy: Secondary | ICD-10-CM | POA: Diagnosis not present

## 2023-11-13 MED ORDER — OXYCODONE-ACETAMINOPHEN 5-325 MG PO TABS
1.0000 | ORAL_TABLET | Freq: Once | ORAL | Status: AC
  Administered 2023-11-13: 1 via ORAL
  Filled 2023-11-13: qty 1
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
## 2023-11-14 MED ORDER — OXYCODONE-ACETAMINOPHEN 5-325 MG PO TABS: 1.0000 | ORAL_TABLET | Freq: Four times a day (QID) | ORAL | 0 refills | Status: DC | PRN

## 2023-11-14 MED ORDER — METHOCARBAMOL 500 MG PO TABS: 500.0000 mg | ORAL_TABLET | Freq: Three times a day (TID) | ORAL | 0 refills | Status: DC | PRN
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
## 2024-01-08 ENCOUNTER — Other Ambulatory Visit: Payer: Self-pay | Admitting: Family Medicine

## 2024-02-07 ENCOUNTER — Other Ambulatory Visit: Payer: Self-pay | Admitting: Family Medicine

## 2024-02-07 DIAGNOSIS — F339 Major depressive disorder, recurrent, unspecified: Secondary | ICD-10-CM

## 2024-02-07 DIAGNOSIS — N529 Male erectile dysfunction, unspecified: Secondary | ICD-10-CM

## 2024-02-07 DIAGNOSIS — G4700 Insomnia, unspecified: Secondary | ICD-10-CM

## 2024-02-07 DIAGNOSIS — R6882 Decreased libido: Secondary | ICD-10-CM

## 2024-02-08 ENCOUNTER — Encounter: Payer: PRIVATE HEALTH INSURANCE | Attending: Psychology | Admitting: Psychology

## 2024-02-21 ENCOUNTER — Telehealth: Payer: Self-pay | Admitting: Neurology

## 2024-02-21 NOTE — Telephone Encounter (Signed)
 Pt's wife Havier Deeb [place] called wanting to speak to the RN to see how they can start the sleep study process again. Pt never got his cpap machine and is now wanting to get it. Please advise.

## 2024-02-21 NOTE — Telephone Encounter (Signed)
 Spoke to wife and was able to schedule with NP. Explained to wife that he may or may not have to retake the sleep test all depending on what the insurance says. She verbalized understanding.

## 2024-02-22 ENCOUNTER — Ambulatory Visit (INDEPENDENT_AMBULATORY_CARE_PROVIDER_SITE_OTHER): Payer: PRIVATE HEALTH INSURANCE | Admitting: Family Medicine

## 2024-02-22 ENCOUNTER — Encounter: Payer: Self-pay | Admitting: Family Medicine

## 2024-02-22 VITALS — BP 136/80 | HR 79 | Temp 98.0°F | Resp 16 | Ht 70.0 in | Wt 190.6 lb

## 2024-02-22 DIAGNOSIS — F339 Major depressive disorder, recurrent, unspecified: Secondary | ICD-10-CM

## 2024-02-22 MED ORDER — SERTRALINE HCL 25 MG PO TABS: 25.0000 mg | ORAL_TABLET | Freq: Every day | ORAL | 3 refills | Status: AC

## 2024-02-22 NOTE — Progress Notes (Unsigned)
 [facility] [address] [place]. [place] [address] [phone]       OFFICE FOLLOW UP NOTE  Mr. Brian Velez Date of Birth:  09-14-1965 Medical Record Number:  [id number]    Primary neurologist: Dr. Chalice Reason for visit: Sleep complaints    SUBJECTIVE:   CHIEF COMPLAINT:  No chief complaint on file.   Follow-up visit:  Prior visit: 01/28/2023 with Dr. Chalice (initial consult visit)  Brief HPI:   Brian Velez is a 58 y.o. male who was evaluated by Dr. Chalice in 01/2023 for reevaluation of sleep-related complaints.  Previously completed sleep study over 10 years ago and diagnosed with sleep apnea but noncompliant with CPAP due to difficulty tolerating.  Being followed by [facility] neurology for memory complaints and was encouraged to pursue reevaluation as possible contributing factor.  Noted nocturia, sleepwalking, vivid dreams, witnessed apneas, snoring and insomnia.  ESS 22/24.  On amitriptyline  to help with sleep but recommended discontinuing as possibly contributing to cognitive issues.  Recommended pursuing with sleep study and possibly pursue in lab study once apnea treated for further evaluation of vivid dreams.  Completed HST 02/2023 which showed overall mild OSA with total AHI of 13.5/h and O2 nadir 83%.  AutoPAP recommended and also started on armodafinil  for daytime sleepiness.  Also recommended completion of HLA narcolepsy panel at follow-up visit.       Interval history:  Patient returns today to discuss reevaluation of apnea.  He was never started on CPAP therapy due to insurance issues and is now wanting to get this set up.   He continues to struggle with insomnia, PCP started him on prazosin  in 07/2023 and eventually switched to doxepin  for persistent issues.     ROS:   14 system review of systems performed and negative with exception of those listed in HPI  PMH:  Past Medical History:  Diagnosis Date   History of gout     Hypertension    MI (myocardial infarction) (HCC) 2004   Sleep apnea    don't wear mask anymore   Stroke (HCC) 05/27/2014   right side still weaker (05/31/2014)    PSH:  Past Surgical History:  Procedure Laterality Date   ANAL RECTAL MANOMETRY N/A 07/07/2023   Procedure: ANO RECTAL MANOMETRY;  Surgeon: Legrand Victory LITTIE DOUGLAS, MD;  Location: [facility];  Service: Gastroenterology;  Laterality: N/A;   ANKLE FRACTURE SURGERY Left 2000's   CARDIAC CATHETERIZATION  2004; 2014   COLONOSCOPY WITH PROPOFOL  N/A 12/08/2021   Procedure: COLONOSCOPY WITH PROPOFOL ;  Surgeon: Legrand Victory LITTIE DOUGLAS, MD;  Location: [facility];  Service: Gastroenterology;  Laterality: N/A;   CYST REMOVAL LEG Right    CYSTOSCOPY N/A 06/24/2021   Procedure: CYSTOSCOPY;  Surgeon: Rosalind Zachary NOVAK, MD;  Location: [facility];  Service: Urology;  Laterality: N/A;   HAND SURGERY     left palm and fingertip injury   ROUX-EN-Y GASTRIC BYPASS  2013   SKIN BIOPSY     TRANSURETHRAL RESECTION OF PROSTATE N/A 06/24/2021   Procedure: TRANSURETHRAL RESECTION OF THE PROSTATE (TURP);  Surgeon: Rosalind Zachary NOVAK, MD;  Location: [facility];  Service: Urology;  Laterality: N/A;    Social History:  Social History   Socioeconomic History   Marital status: Married    Spouse name: Not on file   Number of children: Not on file   Years of education: Not on file   Highest education level: Doctorate  Occupational History   Not on file  Tobacco Use   Smoking status: Never   Smokeless tobacco: Never  Vaping Use   Vaping status: Never Used  Substance and Sexual Activity   Alcohol use: No   Drug use: No   Sexual activity: Never  Other Topics Concern   Not on file  Social History Narrative   Brian Velez.   Divorced.   4 children, 1 grandchildren.   Exercise - moderate, goes to the gym   10/2017.   Are you right handed or left handed? right   Are you currently employed ? 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    Who lives with you? Wife and son   What type of home do you live in: 1 story or 2 story? two    Caffeine 2 cups         Right Handed   Social Drivers of Health   Financial Resource Strain: Low Risk  (02/21/2024)   Overall Financial Resource Strain (CARDIA)    Difficulty of Paying Living Expenses: Not very hard  Food Insecurity: No Food Insecurity (02/21/2024)   Hunger Vital Sign    Worried About Running Out of Food in the Last Year: Never true    Ran Out of Food in the Last Year: Never true  Transportation Needs: No Transportation Needs (02/21/2024)   PRAPARE - Administrator, Civil Service (Medical): No    Lack of Transportation (Non-Medical): No  Physical Activity: Inactive (02/21/2024)   Exercise Vital Sign    Days of Exercise per Week: 0 days    Minutes of Exercise per Session: Not on file  Stress: Stress Concern Present (02/21/2024)   [employer] of Occupational Health - Occupational Stress Questionnaire    Feeling of Stress: Very much  Social Connections: Moderately Integrated (02/21/2024)   Social Connection and Isolation Panel    Frequency of Communication with Friends and Family: Twice a week    Frequency of Social Gatherings with Friends and Family: Never    Attends Religious Services: 1 to 4 times per year    Active Member of [employer] or Organizations: Yes    Attends Engineer, structural: 1 to 4 times per year    Marital Status: Married  Catering manager Violence: Not on file    Family History:  Family History  Problem Relation Age of Onset   Dementia Mother        died of dementia   Breast cancer Mother    Hypertension Mother    Heart disease Mother    Cancer Mother        breast   Diabetes Mother    CAD Father    Stroke Father    Prostate cancer Father 27   Hypertension Father    Diabetes Father    Heart disease Father    Colon cancer Father 41   Diabetes type II Sister    Hypertension Sister    Diabetes Sister     Diabetes type II Brother    Hypertension Brother    Diabetes Brother    Colon polyps Brother    Diabetes type II Sister    Hypertension Sister    Diabetes Sister    Diabetes type II Sister    Hypertension Sister    Stroke Brother    Colon cancer Brother    Colon cancer Paternal Uncle    Colon cancer Paternal Uncle    Prostate cancer Paternal  Uncle    Esophageal cancer Neg Hx    Rectal cancer Neg Hx    Stomach cancer Neg Hx     Medications:   Current Outpatient Medications on File Prior to Visit  Medication Sig Dispense Refill   allopurinol  (ZYLOPRIM ) 100 MG tablet TAKE 1 TABLET(100 MG) BY MOUTH DAILY 30 tablet 6   AMBULATORY NON FORMULARY MEDICATION Nitroglycerine ointment 0.125 %  Apply a pea sized amount internally three times daily. Dispense 30 GM  One refill 30 g 1   Armodafinil  200 MG TABS Take 1 tablet (200 mg total) by mouth daily before breakfast. 30 tablet 5   atorvastatin  (LIPITOR) 80 MG tablet Take 1 tablet (80 mg total) by mouth daily. 90 tablet 3   buPROPion  (WELLBUTRIN  XL) 150 MG 24 hr tablet TAKE 1 TABLET(150 MG) BY MOUTH DAILY 30 tablet 2   doxepin  (SINEQUAN ) 10 MG/ML solution Take 0.6 mLs (6 mg total) by mouth at bedtime. 120 mL 1   hydrochlorothiazide  (HYDRODIURIL ) 25 MG tablet TAKE 1 TABLET(25 MG) BY MOUTH DAILY 90 tablet 2   indomethacin  (INDOCIN ) 50 MG capsule Take 3 times daily as needed for gout flares. 30 capsule 1   methocarbamol  (ROBAXIN ) 500 MG tablet Take 1 tablet (500 mg total) by mouth every 8 (eight) hours as needed for muscle spasms. 20 tablet 0   Multiple Vitamins-Minerals (MULTIVITAMIN WITH MINERALS) tablet Take 1 tablet by mouth daily.     olmesartan  (BENICAR ) 20 MG tablet Take 1 tablet (20 mg total) by mouth daily. 90 tablet 2   oxyCODONE -acetaminophen  (PERCOCET/ROXICET) 5-325 MG tablet Take 1 tablet by mouth every 6 (six) hours as needed for severe pain (pain score 7-10). 10 tablet 0   No current facility-administered medications on file  prior to visit.    Allergies:  No Known Allergies    OBJECTIVE:  Physical Exam  There were no vitals filed for this visit. There is no height or weight on file to calculate BMI. No results found.   General: well developed, well nourished, seated, in no evident distress Head: head normocephalic and atraumatic.   Neck: supple with no carotid or supraclavicular bruits Cardiovascular: regular rate and rhythm, no murmurs  Neurologic Exam Mental Status: Awake and fully alert. Oriented to place and time. Recent and remote memory intact. Attention span, concentration and fund of knowledge appropriate. Mood and affect appropriate.  Cranial Nerves: Pupils equal, briskly reactive to light. Extraocular movements full without nystagmus. Visual fields full to confrontation. Hearing intact. Facial sensation intact. Face, tongue, palate moves normally and symmetrically.  Motor: Normal bulk and tone. Normal strength in all tested extremity muscles Gait and Station: Arises from chair without difficulty. Stance is normal. Gait demonstrates normal stride length and balance without use of AD.         ASSESSMENT/PLAN: ODIN MARIANI is a 58 y.o. year old male    Mild OSA:  Per sleep study in 02/2023 showing total AHI 13.5/h and O2 nadir of 83% never started CPAP d/t insurance coverage issues Will place new order to obtain CPAP machine, advised patient repeat HST may be needed based on insurance   Excessive daytime sleepiness: ESS ***, FSS *** Continue armodafinil  Obtain HLA narcolepsy labs per Dr. Lionell recommendation Consider proceeding with MSLT but advised tx of apnea will be required prior to proceeding He was also advised medications will need to be gradually weaned in order to proceed with MSLT including armodafinil , bupropion  and doxepin        Follow  up in *** or call earlier if needed   CC:  PCP: Frann Mabel Mt, DO    I personally spent a total of ***  minutes in the care of the patient today including {Time Based Coding:210964241}.     Harlene Bogaert, AGNP-BC  [facility] [address] [place], [place] [address]  Phone [phone] Fax [phone] Note: This document was prepared with digital dictation and possible smart phrase technology. Any transcriptional errors that result from this process are unintentional.

## 2024-02-22 NOTE — Progress Notes (Signed)
 Chief Complaint  Patient presents with   Depression    Depression     Subjective Brian Velez presents for f/u depression.  Pt is currently being treated with Wellbutrin  XL 150 mg/d.  He sleeps poorly, has low energy, guilt, lack of interest in doing things, depressed mood, is forgetful/poor concentration, poor appetite/wt loss, racing thoughts. No thoughts of harming self or others. No self-medication with alcohol, prescription drugs or illicit drugs. He is active in his yard but does not exercise routinely.  Pt is not following with a counselor/psychologist.  Past Medical History:  Diagnosis Date   History of gout    Hypertension    MI (myocardial infarction) (HCC) 2004   Sleep apnea    don't wear mask anymore   Stroke (HCC) 05/27/2014   right side still weaker (05/31/2014)   Allergies as of 02/22/2024   No Known Allergies      Medication List        Accurate as of February 22, 2024  9:34 AM. If you have any questions, ask your nurse or doctor.          STOP taking these medications    doxepin  10 MG/ML solution Commonly known as: SINEQUAN    methocarbamol  500 MG tablet Commonly known as: ROBAXIN    oxyCODONE -acetaminophen  5-325 MG tablet Commonly known as: PERCOCET/ROXICET       TAKE these medications    allopurinol  100 MG tablet Commonly known as: ZYLOPRIM  TAKE 1 TABLET(100 MG) BY MOUTH DAILY   AMBULATORY NON FORMULARY MEDICATION Nitroglycerine ointment 0.125 %  Apply a pea sized amount internally three times daily. Dispense 30 GM  One refill   Armodafinil  200 MG Tabs Take 1 tablet (200 mg total) by mouth daily before breakfast.   atorvastatin  80 MG tablet Commonly known as: LIPITOR Take 1 tablet (80 mg total) by mouth daily.   buPROPion  150 MG 24 hr tablet Commonly known as: WELLBUTRIN  XL TAKE 1 TABLET(150 MG) BY MOUTH DAILY   hydrochlorothiazide  25 MG tablet Commonly known as: HYDRODIURIL  TAKE 1 TABLET(25 MG) BY MOUTH DAILY    indomethacin  50 MG capsule Commonly known as: INDOCIN  Take 3 times daily as needed for gout flares.   multivitamin with minerals tablet Take 1 tablet by mouth daily.   olmesartan  20 MG tablet Commonly known as: BENICAR  Take 1 tablet (20 mg total) by mouth daily.   sertraline  25 MG tablet Commonly known as: ZOLOFT  Take 1 tablet (25 mg total) by mouth daily.        Exam BP 136/80 (BP Location: Left Arm, Patient Position: Sitting)   Pulse 79   Temp 98 F (36.7 C) (Oral)   Resp 16   Ht 5' 10 (1.778 m)   Wt 190 lb 9.6 oz (86.5 kg)   SpO2 98%   BMI 27.35 kg/m  General:  well developed, well nourished, in no apparent distress Lungs:  No respiratory distress Psych: well oriented with normal range of affect and age-appropriate judgement/insight, alert and oriented x4.  Assessment and Plan  Episode of recurrent major depressive disorder, unspecified depression episode severity (HCC) - Plan: sertraline  (ZOLOFT ) 25 MG tablet  Chronic, not controlled.  Start Zoloft  25 mg daily.  Continue Wellbutrin  XL 150 mg daily.  Needs to follow with neurology for follow-up sleep and memory issues.  That is scheduled tomorrow.  Counseling information provided.  Counseled on exercise. F/u in 6 weeks to recheck. The patient voiced understanding and agreement to the plan.  Mabel Mt Bucklin, DO  02/22/24 9:34 AM

## 2024-02-22 NOTE — Patient Instructions (Signed)
Aim to do some physical exertion for 150 minutes per week. This is typically divided into 5 days per week, 30 minutes per day. The activity should be enough to get your heart rate up. Anything is better than nothing if you have time constraints.  Please consider counseling. Contact [phone] to schedule an appointment or inquire about cost/insurance coverage.  [facility] located at [address], [place], [place].  Phone number = [phone].  Dr. Onoriode Edeh - Adult Psychiatry.    [facility] located at [address], [place], [place]. Phone number = [phone].   [facility] located at [address], [place], [place].  Phone number = [phone].   [facility] located at [address], , [place].  Phone number = [phone].  Let us know if you need anything.  

## 2024-02-23 ENCOUNTER — Other Ambulatory Visit: Payer: Self-pay | Admitting: Neurology

## 2024-02-23 ENCOUNTER — Ambulatory Visit (INDEPENDENT_AMBULATORY_CARE_PROVIDER_SITE_OTHER): Payer: PRIVATE HEALTH INSURANCE | Admitting: Adult Health

## 2024-02-23 ENCOUNTER — Encounter: Payer: Self-pay | Admitting: Adult Health

## 2024-02-23 VITALS — BP 129/64 | HR 77 | Ht 69.0 in | Wt 189.0 lb

## 2024-02-23 DIAGNOSIS — G3184 Mild cognitive impairment, so stated: Secondary | ICD-10-CM | POA: Diagnosis not present

## 2024-02-23 DIAGNOSIS — G4733 Obstructive sleep apnea (adult) (pediatric): Secondary | ICD-10-CM

## 2024-02-23 DIAGNOSIS — G4719 Other hypersomnia: Secondary | ICD-10-CM

## 2024-02-23 MED ORDER — ARMODAFINIL 200 MG PO TABS: 200.0000 mg | ORAL_TABLET | Freq: Every day | ORAL | 5 refills | Status: AC

## 2024-02-23 NOTE — Patient Instructions (Addendum)
 Your Plan:  Order will be placed to get started on CPAP therapy - you will be called to get this set up. If you need to repeat a sleep study, you will be notified   Start armodafinil  every morning as previously advised by Dr. Chalice - this can help increase your daytime energy levels   We will check lab work today to look for other causes of fatigue and memory complaints   Please continue to follow closely with your PCP for depression management as underlying depression can contribute to fatigue and memory difficulties     Follow up in 3 months for initial CPAP follow up visit - this visit needs to be within 31-90 days after receiving CPAP per insurance requirements      Thank you for coming to see us  at [facility]. I hope we have been able to provide you high quality care today.  You may receive a patient satisfaction survey over the next few weeks. We would appreciate your feedback and comments so that we may continue to improve ourselves and the health of our patients.

## 2024-02-23 NOTE — Progress Notes (Signed)
 Brian Velez, CMA  Zott, Gasper Ona, Tammy; Darrel Boyer New orders have been placed for the above pt, DOB: 03/15/1966 Thanks

## 2024-02-29 ENCOUNTER — Ambulatory Visit: Payer: Self-pay | Admitting: Adult Health

## 2024-02-29 DIAGNOSIS — G3184 Mild cognitive impairment, so stated: Secondary | ICD-10-CM

## 2024-02-29 DIAGNOSIS — G4719 Other hypersomnia: Secondary | ICD-10-CM

## 2024-02-29 DIAGNOSIS — G4733 Obstructive sleep apnea (adult) (pediatric): Secondary | ICD-10-CM

## 2024-02-29 MED ORDER — VITAMIN D (ERGOCALCIFEROL) 1.25 MG (50000 UNIT) PO CAPS: 50000.0000 [IU] | ORAL_CAPSULE | ORAL | 0 refills | Status: AC

## 2024-03-02 NOTE — Telephone Encounter (Signed)
 Contacted pt regarding labs, LVM req a call back and advising I will send a [facility] msg.

## 2024-03-06 LAB — NARCOLEPSY EVALUATION
DQA1*01:02: POSITIVE
DQB1*06:02: POSITIVE

## 2024-03-06 LAB — VITAMIN B12: Vitamin B-12: 555 pg/mL (ref 232–1245)

## 2024-03-06 LAB — TSH: TSH: 0.807 u[IU]/mL (ref 0.450–4.500)

## 2024-03-06 LAB — VITAMIN D 25 HYDROXY (VIT D DEFICIENCY, FRACTURES): Vit D, 25-Hydroxy: 20.2 ng/mL — ABNORMAL LOW (ref 30.0–100.0)

## 2024-03-07 ENCOUNTER — Telehealth: Payer: PRIVATE HEALTH INSURANCE | Admitting: Physician Assistant

## 2024-03-07 DIAGNOSIS — R42 Dizziness and giddiness: Secondary | ICD-10-CM

## 2024-03-07 DIAGNOSIS — G4452 New daily persistent headache (NDPH): Secondary | ICD-10-CM

## 2024-03-07 DIAGNOSIS — R112 Nausea with vomiting, unspecified: Secondary | ICD-10-CM

## 2024-03-07 NOTE — Progress Notes (Signed)
 Based on what you shared with me with having persistent headache with dizziness, nausea, and vomiting, I feel your condition warrants further evaluation as soon as possible at an Emergency department.    NOTE: There will be NO CHARGE for this eVisit   If you are having a true medical emergency please call 911.      Emergency Department-[facility]  Get Driving Directions  [phone]  [address]  [place], [place] [address]  Open 24/7/365      [facility] Emergency Department at [facility]  Get Driving Directions  [address]  [place], [place] [address]  Open 24/7/365    Emergency Department- [facility]  Get Driving Directions  [phone]  [address]  [place], [place] [address]  Open 24/7/365      [facility] Department at [facility]  Get Driving Directions  [phone]  [address]  [place], [place] [address]  Open 24/7/365    [facility]  Emergency Department- [facility]  Get Driving Directions  [phone]  [address]  [place], [place] [address]  Open 24/7/365    [place]  Emergency Department- [facility]  Get Driving Directions  [address]  [place], [place] [address]  Open 24/7/365    [facility]  Emergency Department- [facility]  Get Driving Directions  [phone]  [address]  [place], [place] [address]  Open 24/7/365     I have spent 5 minutes in review of e-visit questionnaire, review and updating patient chart, medical decision making and response to patient.   Delon CHRISTELLA Dickinson, PA-C

## 2024-03-08 ENCOUNTER — Emergency Department (HOSPITAL_BASED_OUTPATIENT_CLINIC_OR_DEPARTMENT_OTHER)
Admission: EM | Admit: 2024-03-08 | Discharge: 2024-03-08 | Disposition: A | Discharge status: Home / Self Care | Payer: PRIVATE HEALTH INSURANCE

## 2024-03-08 ENCOUNTER — Encounter (HOSPITAL_BASED_OUTPATIENT_CLINIC_OR_DEPARTMENT_OTHER): Payer: Self-pay

## 2024-03-08 ENCOUNTER — Other Ambulatory Visit: Payer: Self-pay

## 2024-03-08 ENCOUNTER — Emergency Department (HOSPITAL_BASED_OUTPATIENT_CLINIC_OR_DEPARTMENT_OTHER): Payer: PRIVATE HEALTH INSURANCE | Admitting: Radiology

## 2024-03-08 DIAGNOSIS — R0789 Other chest pain: Secondary | ICD-10-CM | POA: Diagnosis present

## 2024-03-08 DIAGNOSIS — I951 Orthostatic hypotension: Secondary | ICD-10-CM | POA: Diagnosis not present

## 2024-03-08 LAB — BASIC METABOLIC PANEL WITH GFR
Anion gap: 11 (ref 5–15)
BUN: 12 mg/dL (ref 6–20)
CO2: 28 mmol/L (ref 22–32)
Calcium: 9.6 mg/dL (ref 8.9–10.3)
Chloride: 101 mmol/L (ref 98–111)
Creatinine, Ser: 1.33 mg/dL — ABNORMAL HIGH (ref 0.61–1.24)
GFR, Estimated: >60 mL/min (ref >60)
Glucose, Bld: 110 mg/dL — ABNORMAL HIGH (ref 70–99)
Potassium: 3.9 mmol/L (ref 3.5–5.1)
Sodium: 141 mmol/L (ref 135–145)

## 2024-03-08 LAB — CBC
HCT: 38.1 % — ABNORMAL LOW (ref 39.0–52.0)
Hemoglobin: 12.4 g/dL — ABNORMAL LOW (ref 13.0–17.0)
MCH: 26.4 pg (ref 26.0–34.0)
MCHC: 32.5 g/dL (ref 30.0–36.0)
MCV: 81.1 fL (ref 80.0–100.0)
Platelets: 311 K/uL (ref 150–400)
RBC: 4.7 MIL/uL (ref 4.22–5.81)
RDW: 15.5 % (ref 11.5–15.5)
WBC: 7.7 K/uL (ref 4.0–10.5)
nRBC: 0 % (ref 0.0–0.2)

## 2024-03-08 LAB — TROPONIN T, HIGH SENSITIVITY
Troponin T High Sensitivity: <15 ng/L (ref <19)
Troponin T High Sensitivity: <15 ng/L (ref <19)

## 2024-03-08 MED ORDER — SODIUM CHLORIDE 0.9 % IV BOLUS
500.0000 mL | Freq: Once | INTRAVENOUS | Status: AC
  Administered 2024-03-08: 500 mL via INTRAVENOUS
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
## 2024-03-08 NOTE — ED Triage Notes (Signed)
 Patient reports chest pain starting Monday and radiating down his left arm. He also reports headache. He states he has a cardiac history and has had an MI in the past, he states it does fell like this. He reports taking nitroglycerine and aspirin  with no relief in symptoms.

## 2024-03-08 NOTE — ED Notes (Signed)
 RN reviewed discharge instructions with pt. Pt verbalized understanding and had no further questions. VSS upon discharge.

## 2024-03-08 NOTE — Discharge Instructions (Signed)
 Drink lots of fluids.  When you change positions go slowly from sitting to standing and laying to sitting.  Follow-up with your primary care doctor next week.  Call the office to make an appointment to review your medications.

## 2024-03-09 NOTE — Telephone Encounter (Signed)
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 It will be required first that he be treated for sleep apnea with CPAP  therapy and if fatigue persists after that time, can consider pursing MSLT but he will need to stop antidepressant medications prior to testing. If he has not yet been set up with CPAP, please  further assist with this. Thank you.  ----- Message ----- From: Rebecka Memos Lab Results In Sent: 02/24/2024   7:37 AM EDT To: Harlene Bogaert, NP

## 2024-03-09 NOTE — Telephone Encounter (Signed)
 Called the pt to review lab work. There was no answer. LVM asking the pt to call back to further discuss.

## 2024-03-13 NOTE — Telephone Encounter (Signed)
 Wife is returning call to RN

## 2024-03-13 NOTE — Telephone Encounter (Signed)
 Pt wife called and I was able to review the lab results. He has not been started on CPAP yet and has not received a call at this time to start. Advised order was sent to [facility] for the pt. We will await the pt to start CPAP first and use the machine for at least 30 days to ensure the apnea is effectively being treating the apnea. If the patient is still tired despite using the CPAP compliantly and is treating the apnea than we could pursue CPAP titration/MSLT to assess and test for narcolepsy.  Advised of [facility]'s number and informed that if the last year sleep study doesn't work, then we will have to see about repeating a HST. We will await to hear what [facility] states.

## 2024-03-14 ENCOUNTER — Ambulatory Visit: Payer: PRIVATE HEALTH INSURANCE | Admitting: Family Medicine

## 2024-03-14 ENCOUNTER — Encounter: Payer: Self-pay | Admitting: Family Medicine

## 2024-03-14 VITALS — BP 126/78 | HR 75 | Temp 98.0°F | Resp 16 | Ht 70.0 in | Wt 185.6 lb

## 2024-03-14 DIAGNOSIS — H8111 Benign paroxysmal vertigo, right ear: Secondary | ICD-10-CM

## 2024-03-14 DIAGNOSIS — G4700 Insomnia, unspecified: Secondary | ICD-10-CM

## 2024-03-14 MED ORDER — MIRTAZAPINE 15 MG PO TBDP: 15.0000 mg | ORAL_TABLET | Freq: Every day | ORAL | 1 refills | Status: DC

## 2024-03-14 MED ORDER — ONDANSETRON 4 MG PO TBDP: 4.0000 mg | ORAL_TABLET | Freq: Three times a day (TID) | ORAL | 0 refills | Status: AC | PRN

## 2024-03-14 NOTE — Telephone Encounter (Signed)
 Per [facility] they have been attemping to contact the pt and he never called them back. They state his insurance doesn't cover DME.

## 2024-03-14 NOTE — Patient Instructions (Signed)
 Stay hydrated.  Send me a message in a couple days if dizziness is not improved.   Consider YouTube if the Epley Maneuver instructions do not make sense.  If the medicine is not helpful for sleep, please reach out to the neurology/sleep team.   Let us  know if you need anything.

## 2024-03-14 NOTE — Progress Notes (Signed)
 Chief Complaint  Patient presents with   Follow-up    ER Follow Up    Brian Velez is 58 y.o. pt here for dizziness.  Duration: 3 months Pass out? No Spinning? Yes; lasts a few seconds after head movements Recent illness/fever? No Headache? Yes Neurologic signs? No Change in PO intake? Yes-eating less because of it Palpitations? No He will have nausea that lingers for around 10 minutes after his bout of spinning is gone.  Patient has terrible sleep.  He was diagnosed with sleep apnea last year but never picked up CPAP.  He is working with the sleep team to remedy this.  He has failed prazosin , trazodone , amitriptyline , doxepin .  He is requesting something to help him sleep.  He usually gets a couple of hours.  He does not feel well rested.  Past Medical History:  Diagnosis Date   History of gout    Hypertension    MI (myocardial infarction) (HCC) 2004   Sleep apnea    don't wear mask anymore   Stroke (HCC) 05/27/2014   right side still weaker (05/31/2014)    BP 126/78 (BP Location: Left Arm, Patient Position: Sitting)   Pulse 75   Temp 98 F (36.7 C) (Oral)   Resp 16   Ht 5' 10 (1.778 m)   Wt 185 lb 9.6 oz (84.2 kg)   SpO2 97%   BMI 26.63 kg/m  General: Awake, alert, appears stated age Eyes: PERRLA, EOMi Ears: Patent, TM's neg b/l Heart: RRR, no murmurs, no carotid bruits Lungs: CTAB, no accessory muscle use MSK: 5/5 strength throughout, gait normal Neuro: No cerebellar signs, patellar reflex 2/4 b/l wo clonus, calcaneal reflex 0/4 b/l wo clonus, biceps reflex 1/4 b/l wo clonus; Dix-Hall-Pike positive on the right. Psych: Age appropriate judgment and insight, normal mood and affect  Benign paroxysmal positional vertigo of right ear - Plan: ondansetron  (ZOFRAN -ODT) 4 MG disintegrating tablet  Insomnia, unspecified type - Plan: mirtazapine  (REMERON  SOL-TAB) 15 MG disintegrating tablet  Epley maneuver.  Consider YouTube if not improving.  Consider vestibular  rehab if still not better.  He will send a message in 2 to 3 days.  Stay hydrated.  Zofran  as needed.  Hopefully this will help with his weight as he reports he has been feeling terrible because of it. Trial Remeron  15 mg nightly.  If he needs further medication to help with this, he will reach out to his sleep team. Follow-up as originally scheduled with me. Pt voiced understanding and agreement to the plan.  Mabel Mt [place], DO 03/14/24 12:00 PM

## 2024-03-27 NOTE — Addendum Note (Signed)
 Addended by: DELFINO AUGUSTIN BROCKS on: 03/27/2024 02:07 PM   Modules accepted: Orders

## 2024-03-27 NOTE — Telephone Encounter (Signed)
 Pt's wife has called to report that they called [facility] and they said pt will have to start the process over, please call wife to discuss further.

## 2024-03-27 NOTE — Telephone Encounter (Signed)
 Ok so we will use Brian Velez's note to reflect repeat of the sleep study. Order for HST will be placed for the pt.

## 2024-03-29 ENCOUNTER — Other Ambulatory Visit: Payer: Self-pay | Admitting: Family Medicine

## 2024-03-29 MED ORDER — NORTRIPTYLINE HCL 10 MG PO CAPS: 10.0000 mg | ORAL_CAPSULE | Freq: Every day | ORAL | 2 refills | Status: DC

## 2024-04-18 ENCOUNTER — Other Ambulatory Visit: Payer: Self-pay

## 2024-04-18 ENCOUNTER — Other Ambulatory Visit: Payer: Self-pay | Admitting: Family Medicine

## 2024-04-18 DIAGNOSIS — G4700 Insomnia, unspecified: Secondary | ICD-10-CM

## 2024-05-02 ENCOUNTER — Ambulatory Visit: Payer: PRIVATE HEALTH INSURANCE | Admitting: Neurology

## 2024-05-02 DIAGNOSIS — G44009 Cluster headache syndrome, unspecified, not intractable: Secondary | ICD-10-CM | POA: Diagnosis not present

## 2024-05-02 DIAGNOSIS — G3184 Mild cognitive impairment, so stated: Secondary | ICD-10-CM

## 2024-05-02 DIAGNOSIS — G4733 Obstructive sleep apnea (adult) (pediatric): Secondary | ICD-10-CM | POA: Diagnosis not present

## 2024-05-02 DIAGNOSIS — G4719 Other hypersomnia: Secondary | ICD-10-CM | POA: Diagnosis not present

## 2024-05-03 NOTE — Progress Notes (Signed)
 [facility] SLEEP TEST REPORT ( by [facility] PAT)   STUDY DATE:  05-02-2024     REFERRING CLINICIAN:  Harlene Bogaert, NP / Dr Frann    CLINICAL INFORMATION/HISTORY: 01/28/2023 with Dr. Chalice (initial consult visit)   Brief HPI:  reports he gets about 4-5 hrs of sleep. He tosses and turns a lot.    Brian Velez is a 58 y.o. male who was evaluated by Dr. Chalice in 01/2023 for reevaluation of sleep-related complaints.  Previously completed sleep study over 10 years ago and diagnosed with sleep apnea but noncompliant with CPAP due to difficulty tolerating.  Being followed by [facility] neurology for memory complaints and was encouraged to pursue reevaluation as possible contributing factor.  Noted nocturia, sleepwalking, vivid dreams, witnessed apneas, snoring and insomnia.  ESS 22/24.  On amitriptyline  to help with sleep but recommended discontinuing as possibly contributing to cognitive issues.  Recommended pursuing with sleep study and possibly pursue in lab study once apnea treated for further evaluation of vivid dreams.  Completed HST 02/2023 which showed overall mild OSA with total AHI of 13.5/h and O2 nadir 83%.  AutoPAP recommended and also started on armodafinil  for daytime sleepiness.  Also recommended was the completion of  a HLA narcolepsy panel at follow-up visit. Per sleep study in 02/2023 showing total AHI 13.5/h and O2 nadir of 83% never started CPAP d/t insurance coverage issues Will place new order to obtain CPAP machine, advised patient repeat HST may be needed based on insurance    Excessive daytime sleepiness: ESS 17/24, FSS 51/63 start armodafinil  200mg  daily as previously advised by Dr. Chalice     Epworth sleepiness score: 17/24.   BMI: 27.9  kg/m   Neck Circumference: NA     Sleep Summary:   Total Recording Time (hours, min):  9 h 0 minutes      Total Sleep Time (hours, min):   6 hours 41 minutes valid sleep time              Percent REM (%):    22.6%                                    Respiratory Indices: AHI stands for Apnea Hypopnea Index - respiratory events per hour of sleep.   Calculated pAHI (per AASM ):  14.4/h                       REM pAHI:    21.8/h                                           NREM pAHI: 12/h                             Positional AHI:    supine AHI was 16.6 /h and non supine sleep AHI was  10/h.   Snoring:                                                Oxygen Saturation Statistics:   Oxygen  Saturation (%) Mean:   95% with a nadir at 87% and max. Saturation of 99% .           O2 Saturation (minutes) <89%:  < 1 minute         Pulse Rate Statistics:   Pulse Mean (bpm):   66 bpm, between 47  and 103 bpm.  PS: This device does not  give a cardiac rhythm information.                         IMPRESSION:  This HST confirms the presence of  mild - moderate sleep apnea with an AHI of 14.4/h , REM sleep accentuated the  AHI, as did a supine sleep position.    RECOMMENDATION: REM sleep dominant apnea is often associated with higher BMI, and weight loss is recommended to reduce the abdominal weight burden on the diaphragm. Avoiding the supine sleep position also helps with a reduction in apnea.   However , intervention by CPAP therapy is recommended:  only by effectively treating the sleep apnea is it possible to complete a work-up of the main symptom of severe hypersomnia.   If daytime sleepiness resolves with the use of CPAP thrapy, there would be no need to proceed further with narcolepsy testing.   I ordered an autotitration CPAP device with a setting from 6-16 cm water, 2 cm EPR and heated humidification.  A nasal interface ( pillow, cradle, nasal mask ) can be used.   Follow up in our sleep clinic after 60 and before 90 days with NP, obtaining new sleepiness scores.    PS : Any patient should be cautioned not to drive, work at heights, or operate dangerous or heavy equipment when  tired or sleepy.   Review of good sleep hygiene measures is accessible to any sleep clinic patient and can be reiterated through online material- I we recommend the Guide to better Sleep   by the NIH.   Weight loss and Core Strength improvement is highly recommended for individuals with low muscle tone and/ or a BMI over 30.  Any CPAP patient should be reminded to be fully compliant with PAP therapy , (defined as using PAP therapy for more than 4 hours each night ) with the goal to improve sleep related symptoms and decrease long term cardiovascular risks. Please note that untreated obstructive sleep apnea may carry additional perioperative morbidity. Patients with significant obstructive sleep apnea should receive perioperative PAP therapy and the surgical team should be informed of the diagnosis and degree of sleep disordered breathing. The referring physician will be notified of the test results.       INTERPRETING PHYSICIAN:   Dedra Gores, MD  [facility] Sleep Board certified by The [employer] of Sleep Medicine and Diplomate of the [employer] of Sleep Medicine. Board certified In Neurology through the ABPN, Fellow of the [employer] of Neu[employer].

## 2024-05-05 ENCOUNTER — Telehealth: Payer: Self-pay

## 2024-05-05 MED ORDER — NORTRIPTYLINE HCL 10 MG PO CAPS: 10.0000 mg | ORAL_CAPSULE | Freq: Every day | ORAL | 2 refills | Status: DC

## 2024-05-05 NOTE — Telephone Encounter (Signed)
 Copied from CRM [id number]. Topic: Clinical - Medication Question >> May 05, 2024  1:58 PM Anairis L wrote: Reason for CRM: Dr. Frann sent in a liquid medication for sleep. Mr.Hickson was able to rest so he would like a refill.He does not know the medication name.Thank you.   This is the patient's preferred pharmacy:  [id number] [place], [place] - [address] AT [facility] [address] [facility] [address] [place] [address] Phone: [phone] Fax: [phone]

## 2024-05-09 ENCOUNTER — Other Ambulatory Visit: Payer: Self-pay

## 2024-05-09 ENCOUNTER — Other Ambulatory Visit: Payer: Self-pay | Admitting: Family Medicine

## 2024-05-09 MED ORDER — DOXEPIN HCL 10 MG/ML PO CONC: 10.0000 mg | Freq: Every evening | ORAL | 1 refills | Status: DC

## 2024-05-09 NOTE — Telephone Encounter (Signed)
 Patient called back to follow up back on this, He said it was a liquid form medication and it was prescribed for 0.6mg  or ml. Please advise. Call back [phone]. He said he has been without for 2 weeks

## 2024-05-10 ENCOUNTER — Other Ambulatory Visit: Payer: Self-pay

## 2024-05-10 NOTE — Telephone Encounter (Signed)
 I called the patient to see if he started the OTC vitamin D  per 02/29/24 - I left a message for the patient to call the office back regarding medication.    Per Lab note vitamin D  -  Patient is to transition to daily OTC supplement [id number] units daily and ensure f/u with PCP for ongoing monitoring.

## 2024-05-10 NOTE — Procedures (Signed)
 [facility] SLEEP TEST REPORT ( by Watch PAT)   STUDY DATE:  05-02-2024     REFERRING CLINICIAN:  Harlene Bogaert, NP / Dr Frann    CLINICAL INFORMATION/HISTORY: 01/28/2023 with Dr. Chalice (initial consult visit)   Brief HPI:  reports he gets about 4-5 hrs of sleep. He tosses and turns a lot.    Brian Velez is a 58 y.o. male who was evaluated by Dr. Chalice in 01/2023 for reevaluation of sleep-related complaints.  Previously completed sleep study over 10 years ago and diagnosed with sleep apnea but noncompliant with CPAP due to difficulty tolerating.  Being followed by [facility] neurology for memory complaints and was encouraged to pursue reevaluation as possible contributing factor.  Noted nocturia, sleepwalking, vivid dreams, witnessed apneas, snoring and insomnia.  ESS 22/24.  On amitriptyline  to help with sleep but recommended discontinuing as possibly contributing to cognitive issues.  Recommended pursuing with sleep study and possibly pursue in lab study once apnea treated for further evaluation of vivid dreams.  Completed HST 02/2023 which showed overall mild OSA with total AHI of 13.5/h and O2 nadir 83%.  AutoPAP recommended and also started on armodafinil  for daytime sleepiness.  Also recommended was the completion of  a HLA narcolepsy panel at follow-up visit. Per sleep study in 02/2023 showing total AHI 13.5/h and O2 nadir of 83% never started CPAP d/t insurance coverage issues Will place new order to obtain CPAP machine, advised patient repeat HST may be needed based on insurance    Excessive daytime sleepiness: ESS 17/24, FSS 51/63 start armodafinil  200mg  daily as previously advised by Dr. Chalice     Epworth sleepiness score: 17/24.   BMI: 27.9  kg/m   Neck Circumference: NA     Sleep Summary:   Total Recording Time (hours, min):  9 h 0 minutes      Total Sleep Time (hours, min):   6 hours 41 minutes valid sleep time             Percent  REM (%):    22.6%                                    Respiratory Indices: AHI stands for Apnea Hypopnea Index - respiratory events per hour of sleep.   Calculated pAHI (per AASM ):  14.4/h                       REM pAHI:    21.8/h                                           NREM pAHI: 12/h                             Positional AHI:    supine AHI was 16.6 /h and non supine sleep AHI was  10/h.   Snoring:                                                Oxygen Saturation Statistics:   Oxygen Saturation (%)  Mean:   95% with a nadir at 87% and max. Saturation of 99% .           O2 Saturation (minutes) <89%:  < 1 minute         Pulse Rate Statistics:   Pulse Mean (bpm):   66 bpm, between 47  and 103 bpm.  PS: This device does not  give a cardiac rhythm information.                         IMPRESSION:  This HST confirms the presence of  mild - moderate sleep apnea with an AHI of 14.4/h , REM sleep accentuated the  AHI, as did a supine sleep position.    RECOMMENDATION: REM sleep dominant apnea is often associated with higher BMI, and weight loss is recommended to reduce the abdominal weight burden on the diaphragm. Avoiding the supine sleep position also helps with a reduction in apnea.   However , intervention by CPAP therapy is recommended:  only by effectively treating the sleep apnea is it possible to complete a work-up of the main symptom of severe hypersomnia.   If daytime sleepiness resolves with the use of CPAP thrapy, there would be no need to proceed further with narcolepsy testing.   I ordered an autotitration CPAP device with a setting from 6-16 cm water, 2 cm EPR and heated humidification.  A nasal interface ( pillow, cradle, nasal mask ) can be used.   Follow up in our sleep clinic after 60 and before 90 days with NP, obtaining new sleepiness scores.    PS : Any patient should be cautioned not to drive, work at heights, or operate dangerous or heavy equipment when tired or  sleepy.   Review of good sleep hygiene measures is accessible to any sleep clinic patient and can be reiterated through online material- I we recommend the Guide to better Sleep   by the NIH.   Weight loss and Core Strength improvement is highly recommended for individuals with low muscle tone and/ or a BMI over 30.  Any CPAP patient should be reminded to be fully compliant with PAP therapy , (defined as using PAP therapy for more than 4 hours each night ) with the goal to improve sleep related symptoms and decrease long term cardiovascular risks. Please note that untreated obstructive sleep apnea may carry additional perioperative morbidity. Patients with significant obstructive sleep apnea should receive perioperative PAP therapy and the surgical team should be informed of the diagnosis and degree of sleep disordered breathing. The referring physician will be notified of the test results.       INTERPRETING PHYSICIAN:   Dedra Gores, MD  [facility] Sleep Board certified by The [employer] of Sleep Medicine and Diplomate of the [employer] of Sleep Medicine. Board certified In Neurology through the ABPN, Fellow of the [employer] of Neurology.

## 2024-05-11 ENCOUNTER — Ambulatory Visit: Payer: Self-pay | Admitting: Adult Health

## 2024-05-11 ENCOUNTER — Telehealth: Payer: Self-pay

## 2024-05-11 DIAGNOSIS — G4733 Obstructive sleep apnea (adult) (pediatric): Secondary | ICD-10-CM

## 2024-05-11 NOTE — Telephone Encounter (Signed)
 Called pt and spoke to him in concerns of why he needed a refill so quickly when just received a 120 day supply on 9/9. Says he did not know how much to take and ended up taking all of it over this short time. Advised him that he only needs to take 1 ml at bedtime. And advised him he wont be able to receive a refill until 4 months for insurance to approve. He was understanding and says he will take otc meds until   Copied from CRM #[id number]. Topic: Clinical - Medication Question >> May 11, 2024  8:31 AM Thersia C wrote: Reason for RMF:[id number] called in regarding prescription doxepin  (SINEQUAN ) 10 MG/ML solution , stated the pharmacy stated it was too early to filled for his insurance , wanted to know if he would be able to pay out of pocket

## 2024-05-11 NOTE — Telephone Encounter (Signed)
 Cld Pt for results. No answer, LVM for call back.

## 2024-05-11 NOTE — Telephone Encounter (Signed)
 Patient's wife ( listed on DPR) called back to go over medication use. Patient completed 02/29/24 vitamin D  prescription and has been taking an OTC vitamin D  upon completion. Patient plans to keep 06/05/24 appointment with Harlene Bogaert at 12:45.

## 2024-05-11 NOTE — Telephone Encounter (Signed)
-----   Message from [place] sent at 05/11/2024  9:21 AM EDT ----- Please advise patient that his recent sleep study confirmed presence of mild to moderate sleep apnea and CPAP therapy is recommended.  Order will be placed to obtain CPAP machine.  He is currently  scheduled for follow-up visit at the end of this month, this can be canceled and be scheduled for 31 to 90-day follow-up after receiving CPAP.  Thank you. ----- Message ----- From: Chalice Saunas, MD Sent: 05/10/2024   4:55 PM EDT To: Harlene Bogaert, NP

## 2024-05-12 NOTE — Telephone Encounter (Signed)
 Called and spoke to pt wife and relayed results/recommendations. Wife agreeable.   Order sent to dme at [facility]  [phone]

## 2024-05-14 ENCOUNTER — Other Ambulatory Visit: Payer: Self-pay | Admitting: Family Medicine

## 2024-05-14 DIAGNOSIS — R6882 Decreased libido: Secondary | ICD-10-CM

## 2024-05-14 DIAGNOSIS — N529 Male erectile dysfunction, unspecified: Secondary | ICD-10-CM

## 2024-05-14 DIAGNOSIS — F339 Major depressive disorder, recurrent, unspecified: Secondary | ICD-10-CM

## 2024-06-05 ENCOUNTER — Encounter: Payer: PRIVATE HEALTH INSURANCE | Admitting: Adult Health

## 2024-07-12 ENCOUNTER — Ambulatory Visit: Payer: PRIVATE HEALTH INSURANCE | Admitting: Family Medicine

## 2024-07-12 ENCOUNTER — Encounter: Payer: Self-pay | Admitting: Family Medicine

## 2024-07-12 VITALS — BP 128/76 | HR 99 | Temp 98.0°F | Resp 16 | Ht 70.0 in | Wt 191.0 lb

## 2024-07-12 DIAGNOSIS — H8112 Benign paroxysmal vertigo, left ear: Secondary | ICD-10-CM

## 2024-07-12 MED ORDER — DOXEPIN HCL 10 MG PO CAPS: 10.0000 mg | ORAL_CAPSULE | Freq: Every evening | ORAL | 2 refills | Status: AC | PRN

## 2024-07-12 NOTE — Progress Notes (Signed)
 Chief Complaint  Patient presents with   Nausea    Nausea     Brian Velez is 58 y.o. pt here for dizziness.  Duration: 2-3 months Happens 2-3 times per day. No obvious trigger.  Lasts around 1 min. Gets better when  Pass out? No Spinning? Feels everything is moving Recent illness/fever? No Headache? L sided headache following s/s's.  Neurologic signs? No Associated blurred vision w it.  Change in PO intake? No Palpitations? No  Past Medical History:  Diagnosis Date   History of gout    Hypertension    MI (myocardial infarction) (HCC) 2004   Sleep apnea    don't wear mask anymore   Stroke (HCC) 05/27/2014   right side still weaker (05/31/2014)    BP 128/76 (BP Location: Left Arm, Patient Position: Sitting)   Pulse 99   Temp 98 F (36.7 C) (Oral)   Resp 16   Ht 5' 10 (1.778 m)   Wt 191 lb (86.6 kg)   SpO2 98%   BMI 27.41 kg/m  General: Awake, alert, appears stated age Eyes: PERRLA, EOMi Ears: Patent, TM's neg b/l Heart: RRR, no murmurs, no carotid bruits Lungs: CTAB, no accessory muscle use MSK: 5/5 strength throughout, gait normal Neuro: No cerebellar signs, patellar reflex 1/4 b/l wo clonus, calcaneal reflex 0/4 b/l wo clonus, biceps reflex 1/4 b/l wo clonus; Dix-Hall-Pike + on L. Psych: normal mood and affect  Benign paroxysmal positional vertigo of left ear  Stay hydrated. Epley Maneuver. Reach out in a couple d if no better, will refer to vest rehab if no better.  F/u prn. Pt voiced understanding and agreement to the plan.  Mabel Mt [place], DO 07/12/24 11:13 AM

## 2024-07-12 NOTE — Patient Instructions (Addendum)
 Stay hydrated.  Consider referencing YouTube if the instructions for the Epley Maneuver are not making sense. Alternatively, let me know and we can get you in with the vestibular rehab team (physical therapy for balance).   Let us  know if you need anything.

## 2024-07-24 ENCOUNTER — Encounter: Payer: Self-pay | Admitting: Adult Health

## 2024-07-24 ENCOUNTER — Encounter: Payer: PRIVATE HEALTH INSURANCE | Admitting: Adult Health

## 2024-07-24 NOTE — Progress Notes (Deleted)
 [facility] [address] [place]. [place] [address] [phone]       OFFICE FOLLOW UP NOTE  Mr. ZYMIERE TROSTLE Date of Birth:  [birth year] Medical Record Number:  [id number]    Primary neurologist: Dr. Chalice Reason for visit: Sleep complaints    SUBJECTIVE:   CHIEF COMPLAINT:  No chief complaint on file.   Follow-up visit:  Prior visit: 02/23/2024  Brief HPI:   SHERRICK ARAKI is a 58 y.o. male who was evaluated by Dr. Chalice in 01/2023 for reevaluation of sleep-related complaints.  Previously completed sleep study over 10 years ago and diagnosed with sleep apnea but noncompliant with CPAP due to difficulty tolerating.  Being followed by [facility] neurology for memory complaints and was encouraged to pursue reevaluation as possible contributing factor.  Noted nocturia, sleepwalking, vivid dreams, witnessed apneas, snoring and insomnia.  ESS 22/24.  On amitriptyline  to help with sleep but recommended discontinuing as possibly contributing to cognitive issues.  Recommended pursuing with sleep study and possibly pursue in lab study once apnea treated for further evaluation of vivid dreams.  Completed HST 02/2023 which showed overall mild OSA with total AHI of 13.5/h and O2 nadir 83%.  AutoPAP recommended and also started on armodafinil  for daytime sleepiness.  Also recommended completion of HLA narcolepsy panel at follow-up visit.   At prior visit, never started on CPAP due to insurance issues and wished to pursue.  Reported continued excessive daytime fatigue, witnessed apneas and snoring.  CPAP order placed and lab work for reversible causes as well as narcolepsy panel ordered.  He was also started on armodafinil .  Also mention memory concerns, previously evaluated by [facility] neurology with completion of MRI brain which showed chronic small vessel disease and order placed for neurocognitive evaluation but never pursued.     Interval history:  Narcolepsy  panel positive, advised to pursue CPAP therapy and if excessive sleepiness persistent, recommend proceeding with MSLT.  Repeat HST required by insurance in order to pursue CPAP therapy, completed 04/2024 which again showed presence of mild to moderate sleep apnea with total AHI 14.4/h accentuated during REM sleep at 21.8/h and O2 nadir of 87%.  Order to obtain new CPAP placed ***.   Armodafinil  *** Filled 02/2024    Patient returns today to discuss reevaluation of apnea.  He was never started on CPAP therapy due to insurance issues and is now wanting to get this set up.  He continues to have great difficulty falling asleep and staying asleep at night, excessive daytime fatigue with ESS 17/24 and FSS 51/63, nocturia, witnessed apneas and snoring.  He wishes to get started on CPAP therapy.  He also mentions memory concerns.  Seen by PCP yesterday for depression reporting poor sleep, low energy, lack of interest, forgetful/poor concentration, poor appetite and racing thoughts.  He was started on sertraline  in addition to Wellbutrin .  He is greatly concerned about developing Alzheimer's disease as both his mother and father have this disease.  Previously seen for [facility] neurology for memory concerns in 12/2022 and completed MRI brain which showed chronic small vessel disease.  [facility] neurology noted neuropsychology appointment scheduled 03/2023 but unable to view via epic.      ROS:   14 system review of systems performed and negative with exception of those listed in HPI  PMH:  Past Medical History:  Diagnosis Date   History of gout    Hypertension    MI (myocardial infarction) (HCC) 2004   Sleep apnea  don't wear mask anymore   Stroke (HCC) 05/27/2014   right side still weaker (05/31/2014)    PSH:  Past Surgical History:  Procedure Laterality Date   ANAL RECTAL MANOMETRY N/A 07/07/2023   Procedure: ANO RECTAL MANOMETRY;  Surgeon: Legrand Victory LITTIE DOUGLAS, MD;  Location: [facility]  ENDOSCOPY;  Service: Gastroenterology;  Laterality: N/A;   ANKLE FRACTURE SURGERY Left 2000's   CARDIAC CATHETERIZATION  2004; 2014   COLONOSCOPY WITH PROPOFOL  N/A 12/08/2021   Procedure: COLONOSCOPY WITH PROPOFOL ;  Surgeon: Legrand Victory LITTIE DOUGLAS, MD;  Location: [facility] ENDOSCOPY;  Service: Gastroenterology;  Laterality: N/A;   CYST REMOVAL LEG Right    CYSTOSCOPY N/A 06/24/2021   Procedure: CYSTOSCOPY;  Surgeon: Rosalind Zachary NOVAK, MD;  Location: [facility] ORS;  Service: Urology;  Laterality: N/A;   HAND SURGERY     left palm and fingertip injury   ROUX-EN-Y GASTRIC BYPASS  2013   SKIN BIOPSY     TRANSURETHRAL RESECTION OF PROSTATE N/A 06/24/2021   Procedure: TRANSURETHRAL RESECTION OF THE PROSTATE (TURP);  Surgeon: Rosalind Zachary NOVAK, MD;  Location: [facility] ORS;  Service: Urology;  Laterality: N/A;    Social History:  Social History   Socioeconomic History   Marital status: Married    Spouse name: Not on file   Number of children: Not on file   Years of education: Not on file   Highest education level: Doctorate  Occupational History   Not on file  Tobacco Use   Smoking status: Never   Smokeless tobacco: Never  Vaping Use   Vaping status: Never Used  Substance and Sexual Activity   Alcohol use: No   Drug use: No   Sexual activity: Never  Other Topics Concern   Not on file  Social History Narrative   Juliene.   Divorced.   4 children, 1 grandchildren.   Exercise - moderate, goes to the gym   10/2017.   Are you right handed or left handed? right   Are you currently employed ? 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    Who lives with you? Wife and son   What type of home do you live in: 1 story or 2 story? two    Caffeine 2 cups         Right Handed   Social Drivers of Health   Tobacco Use: Low Risk (07/12/2024)   Patient History    Smoking Tobacco Use: Never    Smokeless Tobacco Use: Never    Passive Exposure: Not on file  Financial Resource  Strain: Low Risk (02/21/2024)   Overall Financial Resource Strain (CARDIA)    Difficulty of Paying Living Expenses: Not very hard  Food Insecurity: No Food Insecurity (02/21/2024)   Epic    Worried About Programme Researcher, Broadcasting/film/video in the Last Year: Never true    Ran Out of Food in the Last Year: Never true  Transportation Needs: No Transportation Needs (02/21/2024)   Epic    Lack of Transportation (Medical): No    Lack of Transportation (Non-Medical): No  Physical Activity: Inactive (02/21/2024)   Exercise Vital Sign    Days of Exercise per Week: 0 days    Minutes of Exercise per Session: Not on file  Stress: Stress Concern Present (02/21/2024)   [employer] of Occupational Health - Occupational Stress Questionnaire    Feeling of Stress: Very much  Social Connections: Moderately Integrated (02/21/2024)   Social Connection  and Isolation Panel    Frequency of Communication with Friends and Family: Twice a week    Frequency of Social Gatherings with Friends and Family: Never    Attends Religious Services: 1 to 4 times per year    Active Member of [employer] or Organizations: Yes    Attends Banker Meetings: 1 to 4 times per year    Marital Status: Married  Catering Manager Violence: Not on file  Depression (PHQ2-9): Low Risk (07/12/2024)   Depression (PHQ2-9)    PHQ-2 Score: 0  Alcohol Screen: Not on file  Housing: Low Risk (02/21/2024)   Epic    Unable to Pay for Housing in the Last Year: No    Number of Times Moved in the Last Year: 0    Homeless in the Last Year: No  Utilities: Not on file  Health Literacy: Not on file    Family History:  Family History  Problem Relation Age of Onset   Dementia Mother        died of dementia   Breast cancer Mother    Hypertension Mother    Heart disease Mother    Cancer Mother        breast   Diabetes Mother    CAD Father    Stroke Father    Prostate cancer Father 53   Hypertension Father    Diabetes Father    Heart disease  Father    Colon cancer Father 70   Diabetes type II Sister    Hypertension Sister    Diabetes Sister    Diabetes type II Brother    Hypertension Brother    Diabetes Brother    Colon polyps Brother    Diabetes type II Sister    Hypertension Sister    Diabetes Sister    Diabetes type II Sister    Hypertension Sister    Stroke Brother    Colon cancer Brother    Colon cancer Paternal Uncle    Colon cancer Paternal Uncle    Prostate cancer Paternal Uncle    Esophageal cancer Neg Hx    Rectal cancer Neg Hx    Stomach cancer Neg Hx     Medications:   Current Outpatient Medications on File Prior to Visit  Medication Sig Dispense Refill   allopurinol  (ZYLOPRIM ) 100 MG tablet TAKE 1 TABLET(100 MG) BY MOUTH DAILY 30 tablet 6   AMBULATORY NON FORMULARY MEDICATION Nitroglycerine ointment 0.125 %  Apply a pea sized amount internally three times daily. Dispense 30 GM  One refill 30 g 1   Armodafinil  200 MG TABS Take 1 tablet (200 mg total) by mouth daily before breakfast. 30 tablet 5   atorvastatin  (LIPITOR) 80 MG tablet TAKE 1 TABLET(80 MG) BY MOUTH DAILY 90 tablet 3   buPROPion  (WELLBUTRIN  XL) 150 MG 24 hr tablet TAKE 1 TABLET(150 MG) BY MOUTH DAILY 30 tablet 2   doxepin  (SINEQUAN ) 10 MG capsule Take 1 capsule (10 mg total) by mouth at bedtime as needed (Sleep). 30 capsule 2   hydrochlorothiazide  (HYDRODIURIL ) 25 MG tablet TAKE 1 TABLET(25 MG) BY MOUTH DAILY 90 tablet 2   indomethacin  (INDOCIN ) 50 MG capsule Take 3 times daily as needed for gout flares. 30 capsule 1   Multiple Vitamins-Minerals (MULTIVITAMIN WITH MINERALS) tablet Take 1 tablet by mouth daily.     olmesartan  (BENICAR ) 20 MG tablet TAKE 1 TABLET(20 MG) BY MOUTH DAILY 90 tablet 2   ondansetron  (ZOFRAN -ODT) 4 MG disintegrating tablet Take 1 tablet (  4 mg total) by mouth every 8 (eight) hours as needed for nausea or vomiting. 20 tablet 0   sertraline  (ZOLOFT ) 25 MG tablet Take 1 tablet (25 mg total) by mouth daily. 30 tablet 3    Vitamin D , Ergocalciferol , (DRISDOL ) 1.25 MG (50000 UNIT) CAPS capsule Take 1 capsule (50,000 Units total) by mouth every 7 (seven) days. 6 capsule 0   No current facility-administered medications on file prior to visit.    Allergies:  No Known Allergies    OBJECTIVE:  Physical Exam  There were no vitals filed for this visit.  There is no height or weight on file to calculate BMI. No results found.   General: well developed, well nourished, very pleasant middle-age male, seated, in no evident distress Head: head normocephalic and atraumatic.   Neck: supple with no carotid or supraclavicular bruits Cardiovascular: regular rate and rhythm, no murmurs  Neurologic Exam Mental Status: Awake and fully alert. Oriented to place and time. Recent memory subjectively impaired and remote memory intact. Attention span, concentration and fund of knowledge appropriate during visit. Mood and affect appropriate.  Cranial Nerves: Pupils equal, briskly reactive to light. Extraocular movements full without nystagmus. Visual fields full to confrontation. Hearing intact. Facial sensation intact. Face, tongue, palate moves normally and symmetrically.  Motor: Normal bulk and tone. Normal strength in all tested extremity muscles Gait and Station: Arises from chair without difficulty. Stance is normal. Gait demonstrates normal stride length and balance without use of AD.         ASSESSMENT/PLAN: BRYKER FLETCHALL is a 58 y.o. year old male    Mild OSA:  Per sleep study in 02/2023 showing total AHI 13.5/h and O2 nadir of 83% never started CPAP d/t insurance coverage issues Will place new order to obtain CPAP machine, advised patient repeat HST may be needed based on insurance   Excessive daytime sleepiness: ESS 17/24, FSS 51/63 start armodafinil  200mg  daily as previously advised by Dr. Chalice Narcolepsy panel positive Vitamin D  deficiency -continue supplement Obtain HLA narcolepsy labs per  Dr. Lionell recommendation as well as B12, TSH and vit D Consider proceeding with MSLT but advised tx of apnea will be required prior to proceeding He was also advised medications will need to be gradually weaned in order to proceed with MSLT including armodafinil , bupropion  and sertraline    Memory complaints: Prior work up by [facility] neurology - noted neurocognitive evaluation scheduled 03/2023 - unsure if completed, unable to view via epic Will repeat lab work today to look for reversible causes Memory testing not completed today d/t time constraints - will try to complete at f/u visit  Suspect more vascular etiology with documented history of stroke in 2015, history of MI 2004 and small vessel disease seen on imaging but also suspect other factors contributing to memory complaints including underlying depression, untreated sleep apnea and continued excessive daytime fatigue and insomnia      Follow up in 3 months for initial CPAP compliance visit or call earlier if needed     CC:  PCP: Frann Mabel Mt, DO    I personally spent a total of 45 minutes in the care of the patient today including preparing to see the patient, getting/reviewing separately obtained history, performing a medically appropriate exam/evaluation, counseling and educating, placing orders, and documenting clinical information in the EHR.   Harlene Bogaert, AGNP-BC  [facility] [address] [place], [place] [address]  Phone [phone] Fax [phone] Note: This document was prepared with  digital dictation and possible smart lobbyist. Any transcriptional errors that result from this process are unintentional.

## 2024-09-06 ENCOUNTER — Encounter: Payer: Self-pay | Admitting: Family Medicine

## 2024-09-06 ENCOUNTER — Ambulatory Visit: Payer: PRIVATE HEALTH INSURANCE | Admitting: Family Medicine

## 2024-09-06 VITALS — BP 126/74 | HR 90 | Temp 98.0°F | Resp 16 | Ht 70.0 in | Wt 201.2 lb

## 2024-09-06 DIAGNOSIS — R531 Weakness: Secondary | ICD-10-CM | POA: Diagnosis not present

## 2024-09-06 DIAGNOSIS — R270 Ataxia, unspecified: Secondary | ICD-10-CM

## 2024-09-06 DIAGNOSIS — Z23 Encounter for immunization: Secondary | ICD-10-CM | POA: Diagnosis not present

## 2024-09-06 NOTE — Patient Instructions (Addendum)
 Please call [phone] regarding your CPAP machine.   If you do not hear anything about your referral in the next 1-2 weeks, call our office and ask for an update.  Someone will reach out regarding your scan.  Let us  know if you need anything.

## 2024-09-06 NOTE — Progress Notes (Signed)
 Chief Complaint  Patient presents with   Dizziness    Dizziness     Brian Velez is 59 y.o. pt here for dizziness.  Duration: 3 months; worse as of 1 month ago Pass out? No Spinning? Yes Lasting 3-5 min now, no trigger.  Recent illness/fever? No Headache? Yes Neurologic signs? Yes- weakness on the LUE, LLE, sometimes has difficulty swallowing, has fallen a few times as well. Change in PO intake? No Palpitations? No  Past Medical History:  Diagnosis Date   History of gout    Hypertension    MI (myocardial infarction) (HCC) 2004   Sleep apnea    don't wear mask anymore   Stroke (HCC) 05/27/2014   right side still weaker (05/31/2014)    BP 126/74 (BP Location: Left Arm, Patient Position: Sitting)   Pulse 90   Temp 98 F (36.7 C) (Oral)   Resp 16   Ht 5' 10 (1.778 m)   Wt 201 lb 3.2 oz (91.3 kg)   SpO2 98%   BMI 28.87 kg/m  General: Awake, alert, appears stated age Eyes: PERRLA, EOMi Ears: Patent, TM's neg b/l Heart: RRR, no murmurs, no carotid bruits Lungs: CTAB, no accessory muscle use Neuro: No cerebellar signs, patellar reflex 1/4 b/l wo clonus, calcaneal reflex 0/4 b/l wo clonus, biceps reflex 1/4 b/l wo clonus; [place] + on the right. 4/5 grip strength, upper extremity strength on the left, left lower extremity strength; 5/5 right upper extremity and lower extremity strength.  Gait is cautious. Psych: Age appropriate judgment and insight, normal mood and affect  Ataxia - Plan: Ambulatory referral to Physical Therapy, MR Brain Wo Contrast  Unilateral weakness - Plan: Ambulatory referral to Physical Therapy, MR Brain Wo Contrast  Need for influenza vaccination - Plan: Flu vaccine trivalent PF, 6mos and older(Flulaval,Afluria,Fluarix,Fluzone)  New problem with uncertain prognosis.  Definite worsening 1 month ago.  He may have had another stroke.  Check MRI brain.  Refer to physical therapy for hopefully vestibular rehab plus rehabilitation for his  left extremities.  Could consider adding Plavix for 21 days if he did have a stroke.  Will reach out to neurology for verification of this given it is so far after his initial worsening of symptoms.  He will continue his baby aspirin  daily and Lipitor 80 mg daily.  Stay hydrated.  F/u as originally scheduled for now. Pt voiced understanding and agreement to the plan.  Mabel Mt [place], DO 09/06/24 11:45 AM

## 2024-09-25 ENCOUNTER — Ambulatory Visit: Payer: PRIVATE HEALTH INSURANCE | Admitting: Physical Therapy
